# Patient Record
Sex: Female | Born: 1940 | Race: White | Hispanic: No | Smoking: Current every day smoker
Health system: Southern US, Community
[De-identification: ages and names within clinical notes are randomized; demographics above are authoritative.]

## PROBLEM LIST (undated history)

## (undated) DIAGNOSIS — R55 Syncope and collapse: Secondary | ICD-10-CM

## (undated) DIAGNOSIS — I5022 Chronic systolic (congestive) heart failure: Secondary | ICD-10-CM

## (undated) DIAGNOSIS — F32A Depression, unspecified: Secondary | ICD-10-CM

## (undated) DIAGNOSIS — K921 Melena: Secondary | ICD-10-CM

## (undated) DIAGNOSIS — E663 Overweight: Secondary | ICD-10-CM

## (undated) DIAGNOSIS — F419 Anxiety disorder, unspecified: Secondary | ICD-10-CM

## (undated) DIAGNOSIS — C50911 Malignant neoplasm of unspecified site of right female breast: Secondary | ICD-10-CM

## (undated) DIAGNOSIS — E119 Type 2 diabetes mellitus without complications: Secondary | ICD-10-CM

## (undated) DIAGNOSIS — I4819 Other persistent atrial fibrillation: Secondary | ICD-10-CM

## (undated) DIAGNOSIS — J189 Pneumonia, unspecified organism: Secondary | ICD-10-CM

## (undated) DIAGNOSIS — I10 Essential (primary) hypertension: Secondary | ICD-10-CM

## (undated) DIAGNOSIS — I519 Heart disease, unspecified: Secondary | ICD-10-CM

## (undated) DIAGNOSIS — F329 Major depressive disorder, single episode, unspecified: Secondary | ICD-10-CM

## (undated) DIAGNOSIS — E7800 Pure hypercholesterolemia, unspecified: Secondary | ICD-10-CM

## (undated) HISTORY — PX: TONSILLECTOMY: SUR1361

## (undated) HISTORY — DX: Other persistent atrial fibrillation: I48.19

## (undated) HISTORY — DX: Overweight: E66.3

## (undated) HISTORY — DX: Melena: K92.1

---

## 2008-10-31 DIAGNOSIS — C50911 Malignant neoplasm of unspecified site of right female breast: Secondary | ICD-10-CM

## 2008-10-31 HISTORY — DX: Malignant neoplasm of unspecified site of right female breast: C50.911

## 2008-10-31 HISTORY — PX: BREAST BIOPSY: SHX20

## 2009-10-31 HISTORY — PX: MASTECTOMY: SHX3

## 2011-11-29 DIAGNOSIS — C50919 Malignant neoplasm of unspecified site of unspecified female breast: Secondary | ICD-10-CM | POA: Diagnosis not present

## 2011-12-07 DIAGNOSIS — C50919 Malignant neoplasm of unspecified site of unspecified female breast: Secondary | ICD-10-CM | POA: Diagnosis not present

## 2012-02-03 ENCOUNTER — Telehealth: Payer: Self-pay | Admitting: Family Medicine

## 2012-02-03 NOTE — Telephone Encounter (Signed)
Will write order but it seems as if pt will need to establish care w/ local oncologist.  Odd that UC would not be able to flush port.  Pt needs this done ASAP.

## 2012-02-03 NOTE — Telephone Encounter (Signed)
Contacted MD Alwyn Ren to request information on placing such an order, was advised per MD Alwyn Ren to have pt go to UC/ER at [facility] to have the port assessed before sending an order to be flushed per unable to receive copy of order from MD in [place] per called pt and unable to contact them, left vm advising for pt to go to UC/ER to have port assessed and flushed ASAP, gave our office phone number with my personal  extension and advised to contact me with any questions, note pt needs to bring in orders to have on file for future need as well as an apt  To establish care with local oncologist

## 2012-02-03 NOTE — Telephone Encounter (Signed)
Patient is will be a new patient & is scheduled 5.16.13 for a New Patient Eval. Patient has just moved here from [place] & has a [employer]e. It has been 8-weeks since it was last flushed. She did get an order from her Oncologist in [place] to have it flushed here but the hospital will not recognize the order since Dr., is not a [facility]. Would you be willing to write the patient an order to have the [employer]e flushed if she brings or faxes in her oncologist orders? Please review & advise Patient ph# [phone]

## 2012-02-03 NOTE — Telephone Encounter (Signed)
Please advise 

## 2012-02-06 ENCOUNTER — Emergency Department (HOSPITAL_COMMUNITY)
Admission: EM | Admit: 2012-02-06 | Discharge: 2012-02-06 | Disposition: A | Discharge status: Home / Self Care | Payer: Medicare Other | Attending: Emergency Medicine | Admitting: Emergency Medicine

## 2012-02-06 ENCOUNTER — Encounter (HOSPITAL_COMMUNITY): Payer: Self-pay | Admitting: Emergency Medicine

## 2012-02-06 DIAGNOSIS — F329 Major depressive disorder, single episode, unspecified: Secondary | ICD-10-CM | POA: Insufficient documentation

## 2012-02-06 DIAGNOSIS — Z95828 Presence of other vascular implants and grafts: Secondary | ICD-10-CM

## 2012-02-06 DIAGNOSIS — E7800 Pure hypercholesterolemia, unspecified: Secondary | ICD-10-CM | POA: Diagnosis not present

## 2012-02-06 DIAGNOSIS — E119 Type 2 diabetes mellitus without complications: Secondary | ICD-10-CM | POA: Diagnosis not present

## 2012-02-06 DIAGNOSIS — F17200 Nicotine dependence, unspecified, uncomplicated: Secondary | ICD-10-CM | POA: Diagnosis not present

## 2012-02-06 DIAGNOSIS — C801 Malignant (primary) neoplasm, unspecified: Secondary | ICD-10-CM | POA: Diagnosis not present

## 2012-02-06 DIAGNOSIS — I10 Essential (primary) hypertension: Secondary | ICD-10-CM | POA: Diagnosis not present

## 2012-02-06 DIAGNOSIS — T82598A Other mechanical complication of other cardiac and vascular devices and implants, initial encounter: Secondary | ICD-10-CM | POA: Diagnosis not present

## 2012-02-06 DIAGNOSIS — F3289 Other specified depressive episodes: Secondary | ICD-10-CM | POA: Insufficient documentation

## 2012-02-06 DIAGNOSIS — Z452 Encounter for adjustment and management of vascular access device: Secondary | ICD-10-CM | POA: Diagnosis not present

## 2012-02-06 DIAGNOSIS — M129 Arthropathy, unspecified: Secondary | ICD-10-CM | POA: Insufficient documentation

## 2012-02-06 DIAGNOSIS — Z9089 Acquired absence of other organs: Secondary | ICD-10-CM | POA: Insufficient documentation

## 2012-02-06 HISTORY — DX: Syncope and collapse: R55

## 2012-02-06 HISTORY — DX: Pure hypercholesterolemia, unspecified: E78.00

## 2012-02-06 HISTORY — DX: Major depressive disorder, single episode, unspecified: F32.9

## 2012-02-06 HISTORY — DX: Essential (primary) hypertension: I10

## 2012-02-06 HISTORY — DX: Depression, unspecified: F32.A

## 2012-02-06 MED ORDER — HEPARIN SOD (PORK) LOCK FLUSH 100 UNIT/ML IV SOLN
500.0000 [IU] | INTRAVENOUS | Status: AC | PRN
  Administered 2012-02-06: 500 [IU]
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
## 2012-02-06 NOTE — ED Notes (Signed)
Pt is waiting for IV team to flush port

## 2012-02-06 NOTE — ED Notes (Signed)
Pt presenting to ed with c/o just moving here from [place] and needing her porta cath flushed pt states she does not have a doctor here and she needs to get it flushed soon because it's been 8 weeks pt is alert and oriented at this time. Pt is in nad

## 2012-02-06 NOTE — Discharge Instructions (Signed)
Follow up with your doctor as scheduled. If any issues with your power port, return to ER.

## 2012-02-06 NOTE — ED Provider Notes (Signed)
History     CSN: [id number]  Arrival date & time 02/06/12  1226   First MD Initiated Contact with Patient 02/06/12 1328      Chief Complaint  Patient presents with  . Vascular Access Problem    (Consider location/radiation/quality/duration/timing/severity/associated sxs/prior treatment) The history is provided by the patient.  Pt is a 71yo female, presents with a CC of getting her power port flushed. She just moved from [place] where she was receiving chemotherapy through her port. She states she was done with all treatment. States had her port flushed every 8 wks there, and now it has been 9 wks since it has been done. Her appt here is not until may. She has no other complaints, no fever, chills, malaise, no pain.   Past Medical History  Diagnosis Date  . Cancer   . Diabetes mellitus   . Hypertension   . Depression   . Syncopal episodes   . Arthritis   . High cholesterol     Past Surgical History  Procedure Date  . Tonsillectomy     No family history on file.  History  Substance Use Topics  . Smoking status: Current Everyday Smoker  . Smokeless tobacco: Not on file  . Alcohol Use: No    OB History    Grav Para Term Preterm Abortions TAB SAB Ect Mult Living                  Review of Systems  All other systems reviewed and are negative.    Allergies  Review of patient's allergies indicates no known allergies.  Home Medications   Current Outpatient Rx  Name Route Sig Dispense Refill  . GLIMEPIRIDE 2 MG PO TABS Oral Take 2 mg by mouth at bedtime.    Marland Kitchen LETROZOLE 2.5 MG PO TABS Oral Take 2.5 mg by mouth daily.    Marland Kitchen LISINOPRIL-HYDROCHLOROTHIAZIDE 20-12.5 MG PO TABS Oral Take 1 tablet by mouth daily.    Marland Kitchen METFORMIN HCL 500 MG PO TABS Oral Take 500 mg by mouth 2 (two) times daily with a meal.    . PRAVASTATIN SODIUM 40 MG PO TABS Oral Take 40 mg by mouth daily.      BP 158/76  Pulse 85  Temp(Src) 98 F (36.7 C) (Oral)  Resp 18  SpO2 99%  Physical  Exam  Nursing note and vitals reviewed. Constitutional: She is oriented to person, place, and time. She appears well-developed and well-nourished. No distress.  HENT:  Head: Normocephalic.  Eyes: Conjunctivae are normal.  Neck: Neck supple.  Cardiovascular: Normal rate, regular rhythm and normal heart sounds.   Pulmonary/Chest: Effort normal and breath sounds normal. No respiratory distress. She has no wheezes.       Power port in left chest, non tender, no signs of infection  Abdominal: Soft. Bowel sounds are normal. There is no tenderness.  Musculoskeletal: Normal range of motion. She exhibits no edema.  Neurological: She is alert and oriented to person, place, and time.  Skin: Skin is warm and dry.  Psychiatric: She has a normal mood and affect.    ED Course  Procedures (including critical care time)  Pt has no complaints, only needs her power port flushed. Will call IV team for assistance.  3:09 PM Pt's port was flushed by IV nurse. Will d/c  1. Port-a-cath in place       MDM          Lottie Mussel, [place] 02/06/12 1510

## 2012-02-06 NOTE — Telephone Encounter (Signed)
Pt noted in chart to have visited [facility] ER and had her port cath assessed/flushed

## 2012-02-07 NOTE — ED Provider Notes (Signed)
Medical screening examination/treatment/procedure(s) were performed by non-physician practitioner and as supervising physician I was immediately available for consultation/collaboration.  Toy Baker, MD 02/07/12 [phone]

## 2012-03-15 ENCOUNTER — Encounter: Payer: Self-pay | Admitting: Family Medicine

## 2012-03-15 ENCOUNTER — Ambulatory Visit (INDEPENDENT_AMBULATORY_CARE_PROVIDER_SITE_OTHER): Payer: Medicare Other | Admitting: Family Medicine

## 2012-03-15 VITALS — BP 130/75 | HR 100 | Temp 98.0°F | Ht 63.75 in | Wt 206.4 lb

## 2012-03-15 DIAGNOSIS — E119 Type 2 diabetes mellitus without complications: Secondary | ICD-10-CM | POA: Insufficient documentation

## 2012-03-15 DIAGNOSIS — F4323 Adjustment disorder with mixed anxiety and depressed mood: Secondary | ICD-10-CM | POA: Insufficient documentation

## 2012-03-15 DIAGNOSIS — E785 Hyperlipidemia, unspecified: Secondary | ICD-10-CM | POA: Insufficient documentation

## 2012-03-15 DIAGNOSIS — I10 Essential (primary) hypertension: Secondary | ICD-10-CM | POA: Diagnosis not present

## 2012-03-15 DIAGNOSIS — C50919 Malignant neoplasm of unspecified site of unspecified female breast: Secondary | ICD-10-CM

## 2012-03-15 MED ORDER — CLONAZEPAM 0.5 MG PO TABS: 0.5000 mg | ORAL_TABLET | Freq: Every evening | ORAL | Status: DC | PRN

## 2012-03-15 MED ORDER — LISINOPRIL-HYDROCHLOROTHIAZIDE 20-12.5 MG PO TABS: 1.0000 | ORAL_TABLET | Freq: Every day | ORAL | Status: DC

## 2012-03-15 MED ORDER — METFORMIN HCL 500 MG PO TABS: 500.0000 mg | ORAL_TABLET | Freq: Two times a day (BID) | ORAL | Status: DC

## 2012-03-15 MED ORDER — GLIMEPIRIDE 2 MG PO TABS: 2.0000 mg | ORAL_TABLET | Freq: Every day | ORAL | Status: DC

## 2012-03-15 MED ORDER — PRAVASTATIN SODIUM 40 MG PO TABS: 40.0000 mg | ORAL_TABLET | Freq: Every day | ORAL | Status: DC

## 2012-03-15 NOTE — Patient Instructions (Signed)
We'll call you with your oncology appt Once we get your records we'll determine what needs to be done Call with any questions or concerns Hang in there!!!

## 2012-03-15 NOTE — Progress Notes (Signed)
  Subjective:    Patient ID: Darlene Harrell, female    DOB: 10-18-1941, 71 y.o.   MRN: [id number]  HPI New to establish.  Recently moved from [place].  HTN- chronic problem, on Lisinopril HCTZ.  Denies CP, SOB, HAs, visual changes, edema.  Hyperlipidemia- chronic problem, on Pravastatin.  No abd pain, N/V, myalgias.  Unsure when last blood work done.  Breast Cancer- dx'd in 2010, now on Femara.  Has power port in place, no longer receiving med infusions.  Reports she gets blood draws from the port.  DM- chronic problem, on Amaryl daily.  Does not check CBGs regularly.  When she does check she reports it's running 'high'.  Not having symptomatic lows.  Overdue on eye exam.  Anxiety/Depression- son passed away in 01/25/23, is taking klonopin prn for sleep.  Having difficulty w/ son's death and recent move from [place].   Review of Systems For ROS see HPI     Objective:   Physical Exam  Constitutional: She is oriented to person, place, and time. She appears well-developed and well-nourished. No distress.  HENT:  Head: Normocephalic and atraumatic.  Eyes: Conjunctivae and EOM are normal. Pupils are equal, round, and reactive to light.  Neck: Normal range of motion. Neck supple. No thyromegaly present.  Cardiovascular: Normal rate, regular rhythm, normal heart sounds and intact distal pulses.   No murmur heard. Pulmonary/Chest: Effort normal and breath sounds normal. No respiratory distress.  Abdominal: Soft. She exhibits no distension. There is no tenderness.  Musculoskeletal: She exhibits no edema.  Lymphadenopathy:    She has no cervical adenopathy.  Neurological: She is alert and oriented to person, place, and time.  Skin: Skin is warm and dry.  Psychiatric: She has a normal mood and affect. Her behavior is normal.          Assessment & Plan:

## 2012-03-18 NOTE — Assessment & Plan Note (Signed)
New to provider, chronic for pt.  Tolerating statin w/out difficulty.  Due for lab work- will get this drawn at next port flush.

## 2012-03-18 NOTE — Assessment & Plan Note (Signed)
New to provider, chronic for pt.  Fair control.  Asymptomatic.  No changes at this time.

## 2012-03-18 NOTE — Assessment & Plan Note (Signed)
New to provider, chronic for pt.  Reports that she is not checking CBGs regularly and when she does, they have been high since son's death.  Overdue on eye exam.  Son plans to schedule for pt.  Will need updated lab results to adjust meds.  Stressed importance of healthy diet and regular exercise.  Will follow closely.

## 2012-03-18 NOTE — Assessment & Plan Note (Signed)
New to provider.  Pt still has port in place.  Needs local oncologist.  States she is only able to have blood drawn from the port- will refer to onc and ask for lab work to be drawn at next flush.

## 2012-03-18 NOTE — Assessment & Plan Note (Signed)
New.  Pt's son recently passed away and she moved from her family and friends in [place] to live in [place] w/ her son.  Pt and son agree that she is doing 'as well as can be expected' and don't feel that she needs daily meds at this time.  Will follow this closely.

## 2012-03-19 ENCOUNTER — Telehealth: Payer: Self-pay | Admitting: Hematology & Oncology

## 2012-03-19 ENCOUNTER — Telehealth: Payer: Self-pay | Admitting: Family Medicine

## 2012-03-19 NOTE — Telephone Encounter (Signed)
Per Dr. Gustavo Lah office / [facility], they are unable to make an appointment for patient, or do anything until they receive patient's prior records from up [facility].  Was a release form signed by patient and faxed to get those records?  When you get them, will you please be sure I can get those faxed prior to them being sent to be scanned?

## 2012-03-19 NOTE — Telephone Encounter (Signed)
.  faxed forms on 03-15-12

## 2012-03-19 NOTE — Telephone Encounter (Signed)
Received referral for patient. I talked to patient she is going to have Med Onc MD from [place] to fax her records to [place].

## 2012-03-20 NOTE — Telephone Encounter (Signed)
Called pt to clarify the correct name for previous MD, pt noted the same name and number listed on release form that has been faxed, pt noted this is the oncologist MD that she went to, however have not received the notes/records at this time, pt states that she does not remember her PCP and will call back with that information once she locates it, advised that we are awaiting these records in order to process her referral and as soon as they come in someone will call her with the apt time and date, pt understood, re-faxed forms to fax number provided

## 2012-03-21 ENCOUNTER — Telehealth: Payer: Self-pay | Admitting: Hematology & Oncology

## 2012-03-21 NOTE — Telephone Encounter (Signed)
I contacted Dr. Noralyn Pick at [phone] in [facility]. [name] is going to fax me the records.

## 2012-03-23 ENCOUNTER — Telehealth: Payer: Self-pay | Admitting: Hematology & Oncology

## 2012-03-23 NOTE — Telephone Encounter (Signed)
Patient aware of 6-11 new appointment

## 2012-03-28 ENCOUNTER — Other Ambulatory Visit: Payer: Self-pay

## 2012-03-28 ENCOUNTER — Emergency Department (HOSPITAL_COMMUNITY): Payer: Medicare Other

## 2012-03-28 ENCOUNTER — Inpatient Hospital Stay (HOSPITAL_COMMUNITY)
Admission: EM | Admit: 2012-03-28 | Discharge: 2012-04-03 | DRG: 194 | Disposition: A | Discharge status: Home / Self Care | Payer: Medicare Other | Attending: Internal Medicine | Admitting: Internal Medicine

## 2012-03-28 ENCOUNTER — Encounter (HOSPITAL_COMMUNITY): Payer: Self-pay

## 2012-03-28 DIAGNOSIS — I10 Essential (primary) hypertension: Secondary | ICD-10-CM

## 2012-03-28 DIAGNOSIS — R509 Fever, unspecified: Secondary | ICD-10-CM | POA: Diagnosis not present

## 2012-03-28 DIAGNOSIS — J159 Unspecified bacterial pneumonia: Secondary | ICD-10-CM | POA: Diagnosis not present

## 2012-03-28 DIAGNOSIS — R8271 Bacteriuria: Secondary | ICD-10-CM | POA: Diagnosis present

## 2012-03-28 DIAGNOSIS — E0590 Thyrotoxicosis, unspecified without thyrotoxic crisis or storm: Secondary | ICD-10-CM | POA: Diagnosis not present

## 2012-03-28 DIAGNOSIS — E876 Hypokalemia: Secondary | ICD-10-CM | POA: Diagnosis not present

## 2012-03-28 DIAGNOSIS — R079 Chest pain, unspecified: Secondary | ICD-10-CM | POA: Diagnosis not present

## 2012-03-28 DIAGNOSIS — R918 Other nonspecific abnormal finding of lung field: Secondary | ICD-10-CM | POA: Diagnosis not present

## 2012-03-28 DIAGNOSIS — E119 Type 2 diabetes mellitus without complications: Secondary | ICD-10-CM | POA: Diagnosis present

## 2012-03-28 DIAGNOSIS — R5383 Other fatigue: Secondary | ICD-10-CM | POA: Diagnosis not present

## 2012-03-28 DIAGNOSIS — R5381 Other malaise: Secondary | ICD-10-CM | POA: Diagnosis not present

## 2012-03-28 DIAGNOSIS — E860 Dehydration: Secondary | ICD-10-CM

## 2012-03-28 DIAGNOSIS — I4891 Unspecified atrial fibrillation: Secondary | ICD-10-CM | POA: Diagnosis not present

## 2012-03-28 DIAGNOSIS — F17200 Nicotine dependence, unspecified, uncomplicated: Secondary | ICD-10-CM | POA: Diagnosis present

## 2012-03-28 DIAGNOSIS — E871 Hypo-osmolality and hyponatremia: Secondary | ICD-10-CM | POA: Diagnosis not present

## 2012-03-28 DIAGNOSIS — J189 Pneumonia, unspecified organism: Secondary | ICD-10-CM

## 2012-03-28 DIAGNOSIS — R6889 Other general symptoms and signs: Secondary | ICD-10-CM | POA: Diagnosis not present

## 2012-03-28 DIAGNOSIS — R059 Cough, unspecified: Secondary | ICD-10-CM | POA: Diagnosis not present

## 2012-03-28 DIAGNOSIS — E0781 Sick-euthyroid syndrome: Secondary | ICD-10-CM | POA: Diagnosis present

## 2012-03-28 DIAGNOSIS — C50919 Malignant neoplasm of unspecified site of unspecified female breast: Secondary | ICD-10-CM | POA: Diagnosis present

## 2012-03-28 DIAGNOSIS — R748 Abnormal levels of other serum enzymes: Secondary | ICD-10-CM | POA: Diagnosis present

## 2012-03-28 DIAGNOSIS — I059 Rheumatic mitral valve disease, unspecified: Secondary | ICD-10-CM | POA: Diagnosis not present

## 2012-03-28 DIAGNOSIS — E669 Obesity, unspecified: Secondary | ICD-10-CM | POA: Diagnosis present

## 2012-03-28 DIAGNOSIS — R0602 Shortness of breath: Secondary | ICD-10-CM | POA: Diagnosis not present

## 2012-03-28 DIAGNOSIS — D696 Thrombocytopenia, unspecified: Secondary | ICD-10-CM

## 2012-03-28 DIAGNOSIS — I6789 Other cerebrovascular disease: Secondary | ICD-10-CM | POA: Diagnosis not present

## 2012-03-28 DIAGNOSIS — R05 Cough: Secondary | ICD-10-CM | POA: Diagnosis not present

## 2012-03-28 DIAGNOSIS — R112 Nausea with vomiting, unspecified: Secondary | ICD-10-CM | POA: Diagnosis not present

## 2012-03-28 DIAGNOSIS — R946 Abnormal results of thyroid function studies: Secondary | ICD-10-CM | POA: Diagnosis not present

## 2012-03-28 DIAGNOSIS — N390 Urinary tract infection, site not specified: Secondary | ICD-10-CM | POA: Diagnosis not present

## 2012-03-28 LAB — CBC
MCH: 28.5 pg (ref 26.0–34.0)
MCHC: 33.7 g/dL (ref 30.0–36.0)
MCV: 84.4 fL (ref 78.0–100.0)
Platelets: 210 10*3/uL (ref 150–400)
Platelets: 182 10*3/uL (ref 150–400)
RBC: 4.11 MIL/uL (ref 3.87–5.11)
RBC: 3.53 MIL/uL — ABNORMAL LOW (ref 3.87–5.11)
RDW: 14 % (ref 11.5–15.5)
RDW: 13.9 % (ref 11.5–15.5)
WBC: 10.1 10*3/uL (ref 4.0–10.5)

## 2012-03-28 LAB — COMPREHENSIVE METABOLIC PANEL
ALT: 14 U/L (ref 0–35)
AST: 12 U/L (ref 0–37)
Albumin: 3 g/dL — ABNORMAL LOW (ref 3.5–5.2)
Alkaline Phosphatase: 78 U/L (ref 39–117)
Calcium: 9.2 mg/dL (ref 8.4–10.5)
GFR calc Af Amer: >90 mL/min (ref >90)
Potassium: 3.2 mEq/L — ABNORMAL LOW (ref 3.5–5.1)
Sodium: 129 mEq/L — ABNORMAL LOW (ref 135–145)
Total Protein: 7 g/dL (ref 6.0–8.3)

## 2012-03-28 LAB — DIFFERENTIAL
Basophils Absolute: 0 10*3/uL (ref 0.0–0.1)
Basophils Absolute: 0 10*3/uL (ref 0.0–0.1)
Basophils Relative: 0 % (ref 0–1)
Eosinophils Absolute: 0 10*3/uL (ref 0.0–0.7)
Eosinophils Relative: 0 % (ref 0–5)
Lymphocytes Relative: 5 % — ABNORMAL LOW (ref 12–46)
Lymphs Abs: 0.4 10*3/uL — ABNORMAL LOW (ref 0.7–4.0)
Neutro Abs: 8.7 10*3/uL — ABNORMAL HIGH (ref 1.7–7.7)
Neutrophils Relative %: 88 % — ABNORMAL HIGH (ref 43–77)
Neutrophils Relative %: 87 % — ABNORMAL HIGH (ref 43–77)

## 2012-03-28 LAB — LACTIC ACID, PLASMA: Lactic Acid, Venous: 1.2 mmol/L (ref 0.5–2.2)

## 2012-03-28 LAB — URINALYSIS, ROUTINE W REFLEX MICROSCOPIC
Glucose, UA: 250 mg/dL — AB
Ketones, ur: 40 mg/dL — AB
Nitrite: NEGATIVE
Specific Gravity, Urine: 1.028 (ref 1.005–1.030)
pH: 6 (ref 5.0–8.0)

## 2012-03-28 LAB — URINE MICROSCOPIC-ADD ON

## 2012-03-28 LAB — GLUCOSE, CAPILLARY

## 2012-03-28 MED ORDER — SODIUM CHLORIDE 0.9 % IV SOLN
INTRAVENOUS | Status: DC
  Administered 2012-03-28: 19:00:00 via INTRAVENOUS

## 2012-03-28 MED ORDER — GLIMEPIRIDE 2 MG PO TABS
2.0000 mg | ORAL_TABLET | Freq: Every day | ORAL | Status: DC
  Administered 2012-03-28 – 2012-04-02 (×6): 2 mg via ORAL
  Filled 2012-03-28 (×7): qty 1

## 2012-03-28 MED ORDER — SODIUM CHLORIDE 0.9 % IV SOLN
INTRAVENOUS | Status: DC
  Administered 2012-03-28: 13:00:00 via INTRAVENOUS

## 2012-03-28 MED ORDER — LETROZOLE 2.5 MG PO TABS
2.5000 mg | ORAL_TABLET | Freq: Every day | ORAL | Status: DC
  Administered 2012-03-28 – 2012-04-03 (×7): 2.5 mg via ORAL
  Filled 2012-03-28 (×7): qty 1

## 2012-03-28 MED ORDER — LISINOPRIL 20 MG PO TABS
20.0000 mg | ORAL_TABLET | Freq: Every day | ORAL | Status: DC
  Administered 2012-03-28 – 2012-04-03 (×7): 20 mg via ORAL
  Filled 2012-03-28 (×7): qty 1

## 2012-03-28 MED ORDER — CLONAZEPAM 0.5 MG PO TABS
0.5000 mg | ORAL_TABLET | Freq: Every evening | ORAL | Status: DC | PRN
  Administered 2012-03-30 – 2012-04-02 (×5): 0.5 mg via ORAL
  Filled 2012-03-28 (×6): qty 1

## 2012-03-28 MED ORDER — SIMVASTATIN 20 MG PO TABS
20.0000 mg | ORAL_TABLET | Freq: Every day | ORAL | Status: DC
  Administered 2012-03-28 – 2012-03-29 (×2): 20 mg via ORAL
  Filled 2012-03-28 (×3): qty 1

## 2012-03-28 MED ORDER — ASPIRIN EC 81 MG PO TBEC
81.0000 mg | DELAYED_RELEASE_TABLET | Freq: Once | ORAL | Status: AC
  Administered 2012-03-28: 81 mg via ORAL
  Filled 2012-03-28: qty 1

## 2012-03-28 MED ORDER — LEVOFLOXACIN IN D5W 500 MG/100ML IV SOLN
500.0000 mg | INTRAVENOUS | Status: DC
  Administered 2012-03-28 – 2012-03-31 (×4): 500 mg via INTRAVENOUS
  Filled 2012-03-28 (×4): qty 100

## 2012-03-28 MED ORDER — ASPIRIN 81 MG PO CHEW
CHEWABLE_TABLET | ORAL | Status: AC
  Filled 2012-03-28: qty 1

## 2012-03-28 MED ORDER — SODIUM CHLORIDE 0.9 % IV BOLUS (SEPSIS)
500.0000 mL | Freq: Once | INTRAVENOUS | Status: AC
  Administered 2012-03-28: 500 mL via INTRAVENOUS

## 2012-03-28 MED ORDER — ACETAMINOPHEN 500 MG PO TABS
ORAL_TABLET | ORAL | Status: AC
  Administered 2012-03-28: 1000 mg via ORAL
  Filled 2012-03-28: qty 2

## 2012-03-28 MED ORDER — INSULIN ASPART 100 UNIT/ML ~~LOC~~ SOLN
0.0000 [IU] | Freq: Three times a day (TID) | SUBCUTANEOUS | Status: DC
  Administered 2012-03-29 (×3): 3 [IU] via SUBCUTANEOUS
  Administered 2012-03-30: 5 [IU] via SUBCUTANEOUS
  Administered 2012-03-30: 8 [IU] via SUBCUTANEOUS
  Administered 2012-03-30: 5 [IU] via SUBCUTANEOUS
  Administered 2012-03-31: 2 [IU] via SUBCUTANEOUS
  Administered 2012-03-31: 11 [IU] via SUBCUTANEOUS
  Administered 2012-03-31 – 2012-04-01 (×3): 3 [IU] via SUBCUTANEOUS
  Administered 2012-04-01: 5 [IU] via SUBCUTANEOUS
  Administered 2012-04-02: 2 [IU] via SUBCUTANEOUS
  Administered 2012-04-02: 5 [IU] via SUBCUTANEOUS
  Administered 2012-04-03: 2 [IU] via SUBCUTANEOUS

## 2012-03-28 MED ORDER — ACETAMINOPHEN 325 MG PO TABS: 650.0000 mg | ORAL_TABLET | Freq: Once | ORAL | Status: DC

## 2012-03-28 MED ORDER — ACETAMINOPHEN 325 MG PO TABS
650.0000 mg | ORAL_TABLET | Freq: Four times a day (QID) | ORAL | Status: DC | PRN
  Administered 2012-03-28 – 2012-03-29 (×2): 650 mg via ORAL
  Filled 2012-03-28 (×3): qty 2

## 2012-03-28 MED ORDER — INSULIN ASPART 100 UNIT/ML ~~LOC~~ SOLN
0.0000 [IU] | Freq: Every day | SUBCUTANEOUS | Status: DC
  Administered 2012-03-28: 2 [IU] via SUBCUTANEOUS

## 2012-03-28 MED ORDER — ENOXAPARIN SODIUM 40 MG/0.4ML ~~LOC~~ SOLN
40.0000 mg | SUBCUTANEOUS | Status: DC
  Administered 2012-03-28 – 2012-04-02 (×6): 40 mg via SUBCUTANEOUS
  Filled 2012-03-28 (×7): qty 0.4

## 2012-03-28 NOTE — ED Notes (Signed)
Pt and family was concerned about mother getting food and something for a headache. Paged Md Jerolyn Center about headache and a fever 102.3.

## 2012-03-28 NOTE — ED Notes (Signed)
Patient being transported to radiology.will obtain labs upon patient's return 

## 2012-03-28 NOTE — ED Notes (Signed)
States that she wanted the ASA for a headache but her head no longer hurts. States that she still wanted to take the ASA due to aching in back from lying on bed.

## 2012-03-28 NOTE — ED Notes (Signed)
ZOX:[id number]<AV> Expected date:<BR> Expected time:<BR> Means of arrival:<BR> Comments:<BR> EMS/weak/confused/hypertensive

## 2012-03-28 NOTE — ED Notes (Signed)
Per EMS family states pt acting different "hasn't smoked today usual a pack a day"; states feels weak; denies pain, states "don't feel right"

## 2012-03-28 NOTE — ED Provider Notes (Signed)
History     CSN: [id number]  Arrival date & time 03/28/12  4098   First MD Initiated Contact with Patient 03/28/12 0815      Chief Complaint  Patient presents with  . Weakness  . Hypertension    (Consider location/radiation/quality/duration/timing/severity/associated sxs/prior treatment) HPI Pt with 3 days of fatigue, generalized weakness, myalgia, subjective fever and non-productive cough. Decreased PO intake. Med port in place. History of breast CA s/p mastectomy. Denies CP, neck stiffness, sore throat, abd pain, N/V/D, rash.  Past Medical History  Diagnosis Date  . Cancer   . Diabetes mellitus   . Hypertension   . Depression   . Syncopal episodes   . Arthritis   . High cholesterol   . Blood in stool     occasional  . Chicken pox   . Fainting     Past Surgical History  Procedure Date  . Tonsillectomy   . Breast surgery 2010 and 2011     biopsy and noted mastectomy    Family History  Problem Relation Age of Onset  . Stroke Mother   . Hypertension Mother   . Mental retardation Mother   . Diabetes Mother   . Alcohol abuse Father   . Hypertension Father   . Diabetes Father   . Diabetes Son   . Kidney failure Son   . Early death Son   . Hyperlipidemia Son     History  Substance Use Topics  . Smoking status: Current Everyday Smoker -- 1.0 packs/day for 48 years  . Smokeless tobacco: Not on file  . Alcohol Use: Yes     4 drinks a year    OB History    Grav Para Term Preterm Abortions TAB SAB Ect Mult Living                  Review of Systems  Constitutional: Positive for fever, appetite change and fatigue.  HENT: Negative for sore throat, rhinorrhea, neck pain, neck stiffness and sinus pressure.   Respiratory: Positive for cough. Negative for chest tightness, shortness of breath and wheezing.   Cardiovascular: Negative for chest pain, palpitations and leg swelling.  Gastrointestinal: Negative for nausea, vomiting, abdominal pain and diarrhea.    Genitourinary: Negative for dysuria, frequency and flank pain.  Musculoskeletal: Positive for myalgias. Negative for back pain.  Skin: Negative for pallor and rash.  Neurological: Positive for headaches. Negative for syncope and numbness.    Allergies  Review of patient's allergies indicates no known allergies.  Home Medications   Current Outpatient Rx  Name Route Sig Dispense Refill  . CLONAZEPAM 0.5 MG PO TABS Oral Take 1 tablet (0.5 mg total) by mouth at bedtime as needed. 30 tablet 1  . GLIMEPIRIDE 2 MG PO TABS Oral Take 1 tablet (2 mg total) by mouth at bedtime. 30 tablet 1  . LETROZOLE 2.5 MG PO TABS Oral Take 2.5 mg by mouth daily.    Marland Kitchen LISINOPRIL-HYDROCHLOROTHIAZIDE 20-12.5 MG PO TABS Oral Take 1 tablet by mouth daily. 30 tablet 1  . METFORMIN HCL 500 MG PO TABS Oral Take 1 tablet (500 mg total) by mouth 2 (two) times daily with a meal. 60 tablet 1  . PRAVASTATIN SODIUM 40 MG PO TABS Oral Take 1 tablet (40 mg total) by mouth daily. 30 tablet 1    BP 133/57  Pulse 95  Temp(Src) 100.4 F (38 C) (Oral)  Resp 19  SpO2 93%  Physical Exam  Nursing note and vitals reviewed. Constitutional:  She is oriented to person, place, and time. She appears well-developed and well-nourished. No distress.       Flushed appearing  HENT:  Head: Normocephalic and atraumatic.  Mouth/Throat: Oropharynx is clear and moist. No oropharyngeal exudate.  Eyes: EOM are normal. Pupils are equal, round, and reactive to light.  Neck: Normal range of motion. Neck supple.       No meningismus   Cardiovascular: Normal rate and regular rhythm.   Pulmonary/Chest: Effort normal. No respiratory distress. She has no wheezes. She has rales.       Scattered rales  Abdominal: Soft. Bowel sounds are normal. There is no tenderness. There is no rebound and no guarding.  Musculoskeletal: Normal range of motion. She exhibits no edema and no tenderness.       No calf swelling or tenderness  Neurological: She is  alert and oriented to person, place, and time.       5/5 motor, sensation intact  Skin: Skin is warm and dry. No rash noted. No erythema.  Psychiatric: She has a normal mood and affect. Her behavior is normal.    ED Course  Procedures (including critical care time)  Labs Reviewed  CBC - Abnormal; Notable for the following:    WBC 11.0 (*)    Hemoglobin 11.7 (*)    HCT 34.7 (*)    All other components within normal limits  DIFFERENTIAL - Abnormal; Notable for the following:    Neutrophils Relative 88 (*)    Neutro Abs 9.7 (*)    Lymphocytes Relative 3 (*)    Lymphs Abs 0.4 (*)    All other components within normal limits  COMPREHENSIVE METABOLIC PANEL - Abnormal; Notable for the following:    Sodium 129 (*)    Potassium 3.2 (*)    Chloride 92 (*)    Glucose, Bld 247 (*)    Albumin 3.0 (*)    All other components within normal limits  URINALYSIS, ROUTINE W REFLEX MICROSCOPIC - Abnormal; Notable for the following:    Color, Urine AMBER (*) BIOCHEMICALS MAY BE AFFECTED BY COLOR   APPearance CLOUDY (*)    Glucose, UA 250 (*)    Hgb urine dipstick SMALL (*)    Bilirubin Urine SMALL (*)    Ketones, ur 40 (*)    Protein, ur 100 (*)    Leukocytes, UA MODERATE (*)    All other components within normal limits  URINE MICROSCOPIC-ADD ON - Abnormal; Notable for the following:    Squamous Epithelial / LPF FEW (*)    Bacteria, UA FEW (*)    All other components within normal limits  LACTIC ACID, PLASMA  CULTURE, BLOOD (ROUTINE X 2)  CULTURE, BLOOD (ROUTINE X 2)   Dg Chest 2 View  03/28/2012  *RADIOLOGY REPORT*  Clinical Data: Cough, shortness of breath, weakness and fever.  CHEST - 2 VIEW  Comparison: None.  Findings: Trachea is midline.  Left IJ Port-A-Cath tip projects over the SVC.  Heart size within normal limits.  Question very mild interstitial prominence and indistinctness.  No definite pleural fluid. Surgical clips in the right axilla.  IMPRESSION: Mild interstitial prominence  may be due to emphysema.  Difficult to exclude an acute process such as mild edema or viral pneumonia.  Original Report Authenticated By: Reyes Ivan, M.D.     1. Fever   2. Community acquired pneumonia   3. UTI (lower urinary tract infection)   4. Hyponatremia       MDM  Discussed with [facility]. Will see in ED        Loren Racer, MD 03/28/12 1158

## 2012-03-28 NOTE — H&P (Signed)
Hospital Admission Note Date: 03/28/2012  Patient name: Darlene Harrell Medical record number: [id number] Date of birth: [birth year] Age: 71 y.o. Gender: female PCP: Neena Rhymes, MD, MD  Attending physician: Altha Harm, MD  Chief Complaint: Fever, myalgias chills, generalized weakness and nonproductive cough x3 days  History of Present Illness: Patient is a 71 year old female who presents to the emergency room with a three-day history of tactile fever, general myalgias over her entire body, chills, generalized weakness and nonproductive cough. The patient states that she has had decreased oral intake and had one episode of vomiting. She denies any neck stiffness, photophobia but does admit to an intermittent headache.  X-rays of the chest taken in the emergency room suggest a viral pneumonia. We are asked to the patient for further evaluation and management. Scheduled Meds:   . acetaminophen      . enoxaparin  40 mg Subcutaneous Q24H  . glimepiride  2 mg Oral QAC supper  . letrozole  2.5 mg Oral Daily  . levofloxacin (LEVAQUIN) IV  500 mg Intravenous Q24H  . lisinopril  20 mg Oral Daily  . simvastatin  20 mg Oral q1800  . sodium chloride  500 mL Intravenous Once  . sodium chloride  500 mL Intravenous Once  . DISCONTD: sodium chloride   Intravenous STAT  . DISCONTD: acetaminophen  650 mg Oral Once   Continuous Infusions:   . sodium chloride     PRN Meds:.clonazePAM Allergies: Review of patient's allergies indicates no known allergies. Past Medical History  Diagnosis Date  . Cancer   . Diabetes mellitus   . Hypertension   . Depression   . Syncopal episodes   . Arthritis   . High cholesterol   . Blood in stool     occasional  . Chicken pox   . Fainting    Past Surgical History  Procedure Date  . Tonsillectomy   . Breast surgery 2010 and 2011     biopsy and noted mastectomy   Family History  Problem Relation Age of Onset  . Stroke Mother   . Hypertension  Mother   . Mental retardation Mother   . Diabetes Mother   . Alcohol abuse Father   . Hypertension Father   . Diabetes Father   . Diabetes Son   . Kidney failure Son   . Early death Son   . Hyperlipidemia Son    History   Social History  . Marital Status: Divorced    Spouse Name: N/A    Number of Children: N/A  . Years of Education: N/A   Occupational History  . Not on file.   Social History Main Topics  . Smoking status: Current Everyday Smoker -- 1.0 packs/day for 48 years    Types: Cigarettes  . Smokeless tobacco: Never Used  . Alcohol Use: Yes     4 drinks a year  . Drug Use: No  . Sexually Active: No   Other Topics Concern  . Not on file   Social History Narrative  . No narrative on file   Review of Systems: A comprehensive review of systems was negative except for as noted in the history of present illness. Physical Exam: No intake or output data in the 24 hours ending 03/28/12 1632 General: Alert, awake, oriented x3, in no acute distress.  HEENT: [place]/AT PEERL, EOMI Neck: Trachea midline,  no masses, no thyromegal,y no JVD, no carotid bruit OROPHARYNX:  Moist, No exudate/ erythema/lesions.  Heart: Regular rate and rhythm,  without murmurs, rubs, gallops, PMI non-displaced, no heaves or thrills on palpation.  Lungs: Diffuse rales no wheezing or rhonchi noted. No increased vocal fremitus resonant to percussion  Abdomen: Soft, nontender, nondistended, positive bowel sounds, no masses no hepatosplenomegaly noted.Marland Kitchen  Psychiatric: Patient alert and oriented x3, good insight and cognition, good recent to remote recall.   Lab results:  Basename 03/28/12 0852  NA 129*  K 3.2*  CL 92*  CO2 23  GLUCOSE 247*  BUN 10  CREATININE 0.51  CALCIUM 9.2  MG --  PHOS --    Basename 03/28/12 0852  AST 12  ALT 14  ALKPHOS 78  BILITOT 0.6  PROT 7.0  ALBUMIN 3.0*   No results found for this basename: LIPASE:2,AMYLASE:2 in the last 72 hours  Basename 03/28/12 0852   WBC 11.0*  NEUTROABS 9.7*  HGB 11.7*  HCT 34.7*  MCV 84.4  PLT 210   No results found for this basename: CKTOTAL:3,CKMB:3,CKMBINDEX:3,TROPONINI:3 in the last 72 hours No components found with this basename: POCBNP:3 No results found for this basename: DDIMER:2 in the last 72 hours No results found for this basename: HGBA1C:2 in the last 72 hours No results found for this basename: CHOL:2,HDL:2,LDLCALC:2,TRIG:2,CHOLHDL:2,LDLDIRECT:2 in the last 72 hours No results found for this basename: TSH,T4TOTAL,FREET3,T3FREE,THYROIDAB in the last 72 hours No results found for this basename: VITAMINB12:2,FOLATE:2,FERRITIN:2,TIBC:2,IRON:2,RETICCTPCT:2 in the last 72 hours Imaging results:  Dg Chest 2 View  03/28/2012  *RADIOLOGY REPORT*  Clinical Data: Cough, shortness of breath, weakness and fever.  CHEST - 2 VIEW  Comparison: None.  Findings: Trachea is midline.  Left IJ Port-A-Cath tip projects over the SVC.  Heart size within normal limits.  Question very mild interstitial prominence and indistinctness.  No definite pleural fluid. Surgical clips in the right axilla.  IMPRESSION: Mild interstitial prominence may be due to emphysema.  Difficult to exclude an acute process such as mild edema or viral pneumonia.  Original Report Authenticated By: Reyes Ivan, M.D.   Other results: EKG:  sinus tachycardia.   Patient Active Hospital Problem List: Atypical pneumonia (03/28/2012)   Assessment the patient presents with an atypical community-acquired pneumonia. She's been started on Levaquin I will continue on such. Her cultures have been obtained and we will follow the cultures. The patient's cough is nonproductive but if possible we will obtain a sputum culture. The patient's dehydrated state and it is quite possible that the chest x-ray is nonrevealing secondary to her dehydrated state  Will reassess clinically and obtain repeat chest x-ray if warranted from a clinical perspective   Dehydration  (03/28/2012)   Assessment: Patient is clinically dehydrated. We'll continue IV fluids.     Hyponatremia (03/28/2012)   Assessment:  Felt to be secondary to dehydrated state. Will reassess electrolytes in the morning    Bacteriuria with pyuria (03/28/2012)   Assessment:  The patient has a bacteria however has no clinical symptoms. The patient is on Levaquin for her pneumonia which also cover urinary pathogens. We'll followup on the urine culture.    Diabetes type 2 (03/28/2012)  Assessment: Will resume Amaryl and check blood sugars a.c. and at bedtime   Jolyn Deshmukh A. 03/28/2012, 4:32 PM

## 2012-03-28 NOTE — ED Notes (Signed)
Attempted to call report x 1  

## 2012-03-29 ENCOUNTER — Inpatient Hospital Stay (HOSPITAL_COMMUNITY): Payer: Medicare Other

## 2012-03-29 DIAGNOSIS — D696 Thrombocytopenia, unspecified: Secondary | ICD-10-CM

## 2012-03-29 DIAGNOSIS — E871 Hypo-osmolality and hyponatremia: Secondary | ICD-10-CM

## 2012-03-29 DIAGNOSIS — E876 Hypokalemia: Secondary | ICD-10-CM

## 2012-03-29 LAB — BASIC METABOLIC PANEL
CO2: 22 mEq/L (ref 19–32)
CO2: 23 mEq/L (ref 19–32)
Calcium: 8.3 mg/dL — ABNORMAL LOW (ref 8.4–10.5)
Chloride: 96 mEq/L (ref 96–112)
Chloride: 93 mEq/L — ABNORMAL LOW (ref 96–112)
Creatinine, Ser: 0.41 mg/dL — ABNORMAL LOW (ref 0.50–1.10)
GFR calc non Af Amer: >90 mL/min (ref >90)
GFR calc non Af Amer: >90 mL/min (ref >90)
Glucose, Bld: 177 mg/dL — ABNORMAL HIGH (ref 70–99)
Glucose, Bld: 185 mg/dL — ABNORMAL HIGH (ref 70–99)
Glucose, Bld: 196 mg/dL — ABNORMAL HIGH (ref 70–99)
Potassium: 3 mEq/L — ABNORMAL LOW (ref 3.5–5.1)
Potassium: 2.9 mEq/L — ABNORMAL LOW (ref 3.5–5.1)
Sodium: 127 mEq/L — ABNORMAL LOW (ref 135–145)
Sodium: 131 mEq/L — ABNORMAL LOW (ref 135–145)

## 2012-03-29 LAB — GLUCOSE, CAPILLARY: Glucose-Capillary: 214 mg/dL — ABNORMAL HIGH (ref 70–99)

## 2012-03-29 MED ORDER — SODIUM CHLORIDE 0.9 % IV SOLN
INTRAVENOUS | Status: DC
  Administered 2012-03-29: 12:00:00 via INTRAVENOUS
  Filled 2012-03-29 (×3): qty 1000

## 2012-03-29 MED ORDER — POTASSIUM CHLORIDE CRYS ER 20 MEQ PO TBCR
40.0000 meq | EXTENDED_RELEASE_TABLET | Freq: Once | ORAL | Status: AC
  Administered 2012-03-29: 40 meq via ORAL
  Filled 2012-03-29: qty 2

## 2012-03-29 MED ORDER — POTASSIUM CHLORIDE CRYS ER 20 MEQ PO TBCR
40.0000 meq | EXTENDED_RELEASE_TABLET | ORAL | Status: AC
  Administered 2012-03-29 (×2): 40 meq via ORAL
  Filled 2012-03-29 (×2): qty 2

## 2012-03-29 MED ORDER — FUROSEMIDE 10 MG/ML IJ SOLN
40.0000 mg | Freq: Once | INTRAMUSCULAR | Status: AC
  Administered 2012-03-29: 40 mg via INTRAVENOUS
  Filled 2012-03-29: qty 4

## 2012-03-29 MED ORDER — MAGNESIUM SULFATE 40 MG/ML IJ SOLN
2.0000 g | Freq: Once | INTRAMUSCULAR | Status: AC
  Administered 2012-03-29: 2 g via INTRAVENOUS
  Filled 2012-03-29: qty 50

## 2012-03-29 MED ORDER — LEVALBUTEROL HCL 0.63 MG/3ML IN NEBU
0.6300 mg | INHALATION_SOLUTION | Freq: Four times a day (QID) | RESPIRATORY_TRACT | Status: DC
  Administered 2012-03-30 – 2012-03-31 (×5): 0.63 mg via RESPIRATORY_TRACT
  Filled 2012-03-29 (×15): qty 3

## 2012-03-29 NOTE — Progress Notes (Addendum)
Spoke to Darlene Harrell in lab about adding urine culture to ua done earlier in the day. Request order sheet sent to lab.

## 2012-03-29 NOTE — Progress Notes (Signed)
Subjective: Patient reports that she feels better today and her son endorses that she looks better than she did at arrival to the ER yesterday. However the patient appears to be having an increased work of breathing today greater than she did yesterday.  Interval history: The patient has had a worsening of her sodium levels despite IV hydration. Additionally her potassium this morning was low at 2.8. she received 2 doses of potassium orally per supplementation. Objective: Filed Vitals:   03/28/12 2150 03/29/12 0530 03/29/12 1254 03/29/12 1402  BP:  168/74 178/79   Pulse: 84 100 112   Temp:  98.8 F (37.1 C) 101.4 F (38.6 C) 100 F (37.8 C)  TempSrc:  Axillary Oral Oral  Resp:  18 18   Height:      Weight:      SpO2:  94% 93%    Weight change:   Intake/Output Summary (Last 24 hours) at 03/29/12 1818 Last data filed at 03/29/12 1715  Gross per 24 hour  Intake 2359.5 ml  Output   2600 ml  Net -240.5 ml    General: Alert, awake, oriented x3, in no acute distress.  HEENT: [place]/AT PEERL, EOMI Neck: Trachea midline,  no masses, no thyromegal,y no JVD, no carotid bruit OROPHARYNX:  Moist, No exudate/ erythema/lesions.  Heart: Regular rate and rhythm, without murmurs, rubs, gallops, PMI non-displaced, no heaves or thrills on palpation.  Lungs: Clear to auscultation, no wheezing or rhonchi noted. No increased vocal fremitus resonant to percussion  Abdomen: Soft, nontender, nondistended, positive bowel sounds, no masses no hepatosplenomegaly noted..  Neuro: No focal neurological deficits noted cranial nerves II through XII grossly intact. DTRs 2+ bilaterally upper and lower extremities. Strength 5 out of 5 in bilateral upper and lower extremities. Musculoskeletal: No warm swelling or erythema around joints, no spinal tenderness noted. Psychiatric: Patient alert and oriented x3, good insight and cognition, good recent to remote recall. Lymph node survey: No cervical axillary or inguinal  lymphadenopathy noted.     Lab Results:  [facility] 03/29/12 1325 03/29/12 0622  NA 127* 132*  K 3.0* 2.8*  CL 93* 96  CO2 23 22  GLUCOSE 185* 177*  BUN 8 9  CREATININE 0.40* 0.41*  CALCIUM 8.6 8.3*  MG 1.8 --  PHOS -- --    Basename 03/28/12 0852  AST 12  ALT 14  ALKPHOS 78  BILITOT 0.6  PROT 7.0  ALBUMIN 3.0*   No results found for this basename: LIPASE:2,AMYLASE:2 in the last 72 hours  Basename 03/28/12 2042 03/28/12 0852  WBC 10.1 11.0*  NEUTROABS 8.7* 9.7*  HGB 10.2* 11.7*  HCT 29.8* 34.7*  MCV 84.4 84.4  PLT 182 210   No results found for this basename: CKTOTAL:3,CKMB:3,CKMBINDEX:3,TROPONINI:3 in the last 72 hours No components found with this basename: POCBNP:3 No results found for this basename: DDIMER:2 in the last 72 hours No results found for this basename: HGBA1C:2 in the last 72 hours No results found for this basename: CHOL:2,HDL:2,LDLCALC:2,TRIG:2,CHOLHDL:2,LDLDIRECT:2 in the last 72 hours No results found for this basename: TSH,T4TOTAL,FREET3,T3FREE,THYROIDAB in the last 72 hours No results found for this basename: VITAMINB12:2,FOLATE:2,FERRITIN:2,TIBC:2,IRON:2,RETICCTPCT:2 in the last 72 hours  Micro Results: Recent Results (from the past 240 hour(s))  CULTURE, BLOOD (ROUTINE X 2)     Status: Normal (Preliminary result)   Collection Time   03/28/12  9:20 AM      Component Value Range Status Comment   Specimen Description BLOOD LEFT HAND   Final    Special Requests  BOTTLES DRAWN AEROBIC ONLY   Final    Culture  Setup Time [id number]   Final    Culture     Final    Value:        BLOOD CULTURE RECEIVED NO GROWTH TO DATE CULTURE WILL BE HELD FOR 5 DAYS BEFORE ISSUING A FINAL NEGATIVE REPORT   Report Status PENDING   Incomplete   CULTURE, BLOOD (ROUTINE X 2)     Status: Normal (Preliminary result)   Collection Time   03/28/12  9:20 AM      Component Value Range Status Comment   Specimen Description BLOOD LEFT HAND   Final    Special  Requests BOTTLES DRAWN AEROBIC AND ANAEROBIC   Final    Culture  Setup Time [id number]   Final    Culture     Final    Value:        BLOOD CULTURE RECEIVED NO GROWTH TO DATE CULTURE WILL BE HELD FOR 5 DAYS BEFORE ISSUING A FINAL NEGATIVE REPORT   Report Status PENDING   Incomplete     Studies/Results: Dg Chest 2 View  03/29/2012  *RADIOLOGY REPORT*  Clinical Data: Difficulty breathing.  Fever and chest pain  CHEST - 2 VIEW  Comparison: 03/28/2012  Findings: There is a left chest wall porta-catheter with tip in the SVC.  The heart size appears normal.  Interval development of airspace opacity within the perihilar right lower lobe appears new from previous exam.  Mild diffuse interstitial edema is present.  IMPRESSION:  1.  New right base opacity which may represent pneumonia. 2.  Interstitial edema.  Original Report Authenticated By: Rosealee Albee, M.D.   Dg Chest 2 View  03/28/2012  *RADIOLOGY REPORT*  Clinical Data: Cough, shortness of breath, weakness and fever.  CHEST - 2 VIEW  Comparison: None.  Findings: Trachea is midline.  Left IJ Port-A-Cath tip projects over the SVC.  Heart size within normal limits.  Question very mild interstitial prominence and indistinctness.  No definite pleural fluid. Surgical clips in the right axilla.  IMPRESSION: Mild interstitial prominence may be due to emphysema.  Difficult to exclude an acute process such as mild edema or viral pneumonia.  Original Report Authenticated By: Reyes Ivan, M.D.    Medications: I have reviewed the patient's current medications. Scheduled Meds:   . aspirin      . enoxaparin  40 mg Subcutaneous Q24H  . furosemide  40 mg Intravenous Once  . glimepiride  2 mg Oral QAC supper  . insulin aspart  0-15 Units Subcutaneous TID WC  . insulin aspart  0-5 Units Subcutaneous QHS  . letrozole  2.5 mg Oral Daily  . levofloxacin (LEVAQUIN) IV  500 mg Intravenous Q24H  . lisinopril  20 mg Oral Daily  . magnesium sulfate 1 - 4  g bolus IVPB  2 g Intravenous Once  . potassium chloride  40 mEq Oral Q4H  . potassium chloride  40 mEq Oral Q2H  . simvastatin  20 mg Oral q1800   Continuous Infusions:   . sodium chloride 0.9 % 1,000 mL with potassium chloride 40 mEq infusion 100 mL/hr at 03/29/12 1156  . DISCONTD: sodium chloride 100 mL/hr at 03/28/12 1857   PRN Meds:.acetaminophen, clonazePAM Assessment/Plan: Patient Active Hospital Problem List: Community-acquired pneumonia (03/28/2012)   Assessment: The patient appears to have increased work of breathing today however she has no wheezing to suggest reactive airway disease. I will continue her IV Levaquin and  obtain a chest x-ray to further evaluate the anatomy of her lungs. In light of her hyponatremia we'll check a urine Legionella antigen.  Hyponatremia (03/28/2012)   Assessment: Hyponatremia appears to be worsening. 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 Unfortunately the patient has already received IV fluids and thus urine studies  would not be very helpful at this time.    Plan: In light of the patient's euvolemic state and her hyponatremia I will volume restrict her and give her a dose of Lasix. I will repeat her beam at at 6 PM to evaluate the effectiveness of this upon her sodium.   Dehydration (03/28/2012)   Assessment: The patient now appears to be in a euvolemic state. I will saline lock her IV fluids. Patient is drinking well however still does not have much of an appetite for solids   Bacteriuria with pyuria (03/28/2012)   Assessment: Patient has no symptoms of dysuria. Urine cultures pending will continue Levaquin    Hypokalemia (03/29/2012)   Assessment: I will replete her potassium orally and check a magnesium level.   LOS: 1 day

## 2012-03-29 NOTE — Progress Notes (Signed)
UR complete 

## 2012-03-30 DIAGNOSIS — I059 Rheumatic mitral valve disease, unspecified: Secondary | ICD-10-CM

## 2012-03-30 DIAGNOSIS — J189 Pneumonia, unspecified organism: Secondary | ICD-10-CM

## 2012-03-30 DIAGNOSIS — I4891 Unspecified atrial fibrillation: Secondary | ICD-10-CM

## 2012-03-30 DIAGNOSIS — R509 Fever, unspecified: Secondary | ICD-10-CM

## 2012-03-30 LAB — CBC
Hemoglobin: 11.2 g/dL — ABNORMAL LOW (ref 12.0–15.0)
MCHC: 34.3 g/dL (ref 30.0–36.0)
RBC: 3.88 MIL/uL (ref 3.87–5.11)
WBC: 11 10*3/uL — ABNORMAL HIGH (ref 4.0–10.5)

## 2012-03-30 LAB — URINALYSIS, ROUTINE W REFLEX MICROSCOPIC
Glucose, UA: 100 mg/dL — AB
Leukocytes, UA: NEGATIVE
Protein, ur: 30 mg/dL — AB
Urobilinogen, UA: 1 mg/dL (ref 0.0–1.0)

## 2012-03-30 LAB — DIFFERENTIAL
Basophils Relative: 0 % (ref 0–1)
Monocytes Relative: 12 % (ref 3–12)
Neutro Abs: 8.9 10*3/uL — ABNORMAL HIGH (ref 1.7–7.7)
Neutrophils Relative %: 81 % — ABNORMAL HIGH (ref 43–77)

## 2012-03-30 LAB — GLUCOSE, CAPILLARY
Glucose-Capillary: 190 mg/dL — ABNORMAL HIGH (ref 70–99)
Glucose-Capillary: 176 mg/dL — ABNORMAL HIGH (ref 70–99)
Glucose-Capillary: 260 mg/dL — ABNORMAL HIGH (ref 70–99)

## 2012-03-30 LAB — BASIC METABOLIC PANEL
BUN: 11 mg/dL (ref 6–23)
GFR calc Af Amer: >90 mL/min (ref >90)
GFR calc non Af Amer: >90 mL/min (ref >90)
Potassium: 3.2 mEq/L — ABNORMAL LOW (ref 3.5–5.1)
Sodium: 131 mEq/L — ABNORMAL LOW (ref 135–145)

## 2012-03-30 LAB — URINE CULTURE
Colony Count: >=100000
Culture  Setup Time: 201305300232

## 2012-03-30 LAB — CARDIAC PANEL(CRET KIN+CKTOT+MB+TROPI)
CK, MB: 1.7 ng/mL (ref 0.3–4.0)
CK, MB: 1.3 ng/mL (ref 0.3–4.0)
CK, MB: 1.1 ng/mL (ref 0.3–4.0)
Troponin I: 0.43 ng/mL (ref <0.30)
Troponin I: <0.3 ng/mL (ref <0.30)

## 2012-03-30 LAB — INFLUENZA PANEL BY PCR (TYPE A & B): Influenza A By PCR: NEGATIVE

## 2012-03-30 LAB — LEGIONELLA ANTIGEN, URINE

## 2012-03-30 MED ORDER — POTASSIUM CHLORIDE CRYS ER 20 MEQ PO TBCR
40.0000 meq | EXTENDED_RELEASE_TABLET | ORAL | Status: AC
  Administered 2012-03-30 (×2): 40 meq via ORAL
  Filled 2012-03-30 (×2): qty 2

## 2012-03-30 MED ORDER — DILTIAZEM HCL 50 MG/10ML IV SOLN
10.0000 mg | Freq: Once | INTRAVENOUS | Status: AC
  Administered 2012-03-30: 10 mg via INTRAVENOUS
  Filled 2012-03-30: qty 2

## 2012-03-30 MED ORDER — AMIODARONE HCL IN DEXTROSE 360-4.14 MG/200ML-% IV SOLN
1.0000 mg/min | INTRAVENOUS | Status: AC
  Administered 2012-03-30 (×2): 1 mg/min via INTRAVENOUS
  Filled 2012-03-30 (×3): qty 200

## 2012-03-30 MED ORDER — AMIODARONE LOAD VIA INFUSION
150.0000 mg | Freq: Once | INTRAVENOUS | Status: AC
  Administered 2012-03-30: 150 mg via INTRAVENOUS
  Filled 2012-03-30: qty 83.34

## 2012-03-30 MED ORDER — PANTOPRAZOLE SODIUM 40 MG PO TBEC
40.0000 mg | DELAYED_RELEASE_TABLET | Freq: Every day | ORAL | Status: DC
  Administered 2012-03-30 – 2012-04-02 (×4): 40 mg via ORAL
  Filled 2012-03-30 (×6): qty 1

## 2012-03-30 MED ORDER — PRAVASTATIN SODIUM 40 MG PO TABS
40.0000 mg | ORAL_TABLET | Freq: Every day | ORAL | Status: DC
  Administered 2012-03-30 – 2012-04-02 (×4): 40 mg via ORAL
  Filled 2012-03-30 (×5): qty 1

## 2012-03-30 MED ORDER — DILTIAZEM LOAD VIA INFUSION
10.0000 mg | Freq: Once | INTRAVENOUS | Status: AC
  Administered 2012-03-30: 10 mg via INTRAVENOUS
  Filled 2012-03-30: qty 10

## 2012-03-30 MED ORDER — DILTIAZEM HCL 100 MG IV SOLR
5.0000 mg/h | INTRAVENOUS | Status: AC
  Administered 2012-03-30: 10 mg/h via INTRAVENOUS
  Administered 2012-03-30: 20 mg/h via INTRAVENOUS
  Filled 2012-03-30 (×4): qty 100

## 2012-03-30 MED ORDER — PIPERACILLIN-TAZOBACTAM 3.375 G IVPB
3.3750 g | Freq: Three times a day (TID) | INTRAVENOUS | Status: DC
  Administered 2012-03-30 – 2012-04-02 (×10): 3.375 g via INTRAVENOUS
  Filled 2012-03-30 (×13): qty 50

## 2012-03-30 MED ORDER — FUROSEMIDE 10 MG/ML IJ SOLN
40.0000 mg | Freq: Once | INTRAMUSCULAR | Status: AC
  Administered 2012-03-30: 40 mg via INTRAVENOUS
  Filled 2012-03-30: qty 4

## 2012-03-30 MED ORDER — AMIODARONE HCL IN DEXTROSE 360-4.14 MG/200ML-% IV SOLN
0.5000 mg/min | INTRAVENOUS | Status: DC
  Filled 2012-03-30: qty 200

## 2012-03-30 MED ORDER — SODIUM CHLORIDE 0.9 % IV BOLUS (SEPSIS)
500.0000 mL | Freq: Once | INTRAVENOUS | Status: AC
  Administered 2012-03-30: 500 mL via INTRAVENOUS

## 2012-03-30 MED ORDER — ALTEPLASE 100 MG IV SOLR
2.0000 mg | Freq: Once | INTRAVENOUS | Status: AC
  Administered 2012-03-30: 2 mg
  Filled 2012-03-30: qty 2

## 2012-03-30 MED ORDER — AMIODARONE HCL IN DEXTROSE 360-4.14 MG/200ML-% IV SOLN
60.0000 mg/h | INTRAVENOUS | Status: DC
  Administered 2012-03-30 – 2012-04-01 (×6): 60 mg/h via INTRAVENOUS
  Filled 2012-03-30 (×6): qty 200

## 2012-03-30 MED ORDER — IBUPROFEN 600 MG PO TABS
600.0000 mg | ORAL_TABLET | Freq: Four times a day (QID) | ORAL | Status: DC | PRN
  Administered 2012-04-01: 600 mg via ORAL
  Filled 2012-03-30: qty 1

## 2012-03-30 NOTE — Progress Notes (Signed)
Called ref HR 150's while sleeping at 2238 pt converted to sinus

## 2012-03-30 NOTE — Progress Notes (Signed)
Patient's vitals checked--Patient "feeling hot" and flushed. Temp 102.1, bp 155/81, pulse 164, resp 24, sats 93% ra. Patient without complaints. Paged MD after obtaining EKG that showed a fib with RVR. MD to come see patient.

## 2012-03-30 NOTE — Progress Notes (Signed)
Inpatient Diabetes Program Recommendations  AACE/ADA: New Consensus Statement on Inpatient Glycemic Control (2009)  Target Ranges:  Prepandial:   less than 140 mg/dL      Peak postprandial:   less than 180 mg/dL (1-2 hours)      Critically ill patients:  140 - 180 mg/dL   Reason for Visit: Fasting hyperglycemia  Inpatient Diabetes Program Recommendations Insulin - Basal: Fasting glucose high this am.  Please add Lantus 10 units daily or HS while here. HgbA1C: Please order to assess control athome.  Note: Thank you, Lenor Coffin, RN, CNS, Diabetes Coordinator [phone])

## 2012-03-30 NOTE — Progress Notes (Addendum)
Patient transferred to room 1420 for telemetry monitoring. Cardizem 10 mg bolus given by rapid response RN prior to transfer with no significant change in rate. Report given to RN on 4 east once patient transferred to room. Labs drawn from [facility].   Delay in transfer due to ICU RN asking MD about need for stepdown or telemetry bed since heart rate not going down after bolus of cardizem and remaining in 150-170's. Order still for telemetry, not stepdown.

## 2012-03-30 NOTE — Progress Notes (Signed)
Subjective: Patient reports that she feels better today than she did yesterday. Pt also looks better than she did yesterday. She states that her mouth feels dry. Pt denies any feeling of palpitations despite her heart rate at 150 on telemetry and on apical examination. Interval history: Pt had a conversion of HR and rhythm to A-Fib with RVR. She was started on a Cardizem gtt which has been titrated to max of 20 mcg/hr and stil Pt also had a increase in troponin to 0.4 without any chest pain. EKG showed A-fin with  RVR. Objective: Filed Vitals:   03/30/12 0617 03/30/12 0756 03/30/12 0806 03/30/12 1032  BP: 100/66  143/84 125/81  Pulse:   132 153  Temp:      TempSrc:      Resp:      Height:      Weight:      SpO2:  92%     Weight change: -1.519 kg (-3 lb 5.6 oz)  Intake/Output Summary (Last 24 hours) at 03/30/12 1106 Last data filed at 03/30/12 0934  Gross per 24 hour  Intake   1869 ml  Output   4200 ml  Net  -2331 ml    General: Alert, awake, oriented x3, in no acute distress. Family at bedside. HEENT: [place]/AT PEER[place], EOMI Neck: Trachea midline,  no masses, no thyromegal,y no JVD, no carotid bruit OROPHARYNX: Mucosa dry, No exudate/ erythema/lesions.  Heart: Irregularly irregular rhythm on auscultation, without murmurs, rubs, gallops, PMI non-displaced, no heaves or thrills on palpation. A-fib with RVR on telemetry Lungs: Clear to auscultation, no wheezing or rhonchi noted. No increased vocal fremitus resonant to percussion  Abdomen: Soft, nontender, nondistended, positive bowel sounds, no masses no hepatosplenomegaly noted..  Neuro: No focal neurological deficits noted cranial nerves II through XII grossly intact. DTRs 2+ bilaterally upper and lower extremities.  Lab Results:  Basename 03/30/12 0400 03/29/12 1800 03/29/12 1325  NA 131* 131* --  K 3.2* 2.9* --  CL 93* 96 --  CO2 25 24 --  GLUCOSE 225* 196* --  BUN 11 8 --  CREATININE 0.50 0.41* --  CALCIUM 8.4 8.1* --  MG --  -- 1.8  PHOS -- -- --    Basename 03/28/12 0852  AST 12  ALT 14  ALKPHOS 78  BILITOT 0.6  PROT 7.0  ALBUMIN 3.0*   No results found for this basename: LIPASE:2,AMYLASE:2 in the last 72 hours  Basename 03/30/12 0400 03/28/12 2042  WBC 11.0* 10.1  NEUTROABS 8.9* 8.7*  HGB 11.2* 10.2*  HCT 32.7* 29.8*  MCV 84.3 84.4  PLT 222 182    Basename 03/30/12 0400  CKTOTAL 83  CKMB 1.7  CKMBINDEX --  TROPONINI 0.43*   No components found with this basename: POCBNP:3 No results found for this basename: DDIMER:2 in the last 72 hours No results found for this basename: HGBA1C:2 in the last 72 hours No results found for this basename: CHOL:2,HDL:2,LDLCALC:2,TRIG:2,CHOLHDL:2,LDLDIRECT:2 in the last 72 hours No results found for this basename: TSH,T4TOTAL,FREET3,T3FREE,THYROIDAB in the last 72 hours No results found for this basename: VITAMINB12:2,FOLATE:2,FERRITIN:2,TIBC:2,IRON:2,RETICCTPCT:2 in the last 72 hours  Micro Results: Recent Results (from the past 240 hour(s))  CULTURE, BLOOD (ROUTINE X 2)     Status: Normal (Preliminary result)   Collection Time   03/28/12  9:20 AM      Component Value Range Status Comment   Specimen Description BLOOD LEFT HAND   Final    Special Requests BOTTLES DRAWN AEROBIC ONLY   Final  Culture  Setup Time [id number]   Final    Culture     Final    Value:        BLOOD CULTURE RECEIVED NO GROWTH TO DATE CULTURE WILL BE HELD FOR 5 DAYS BEFORE ISSUING A FINAL NEGATIVE REPORT   Report Status PENDING   Incomplete   CULTURE, BLOOD (ROUTINE X 2)     Status: Normal (Preliminary result)   Collection Time   03/28/12  9:20 AM      Component Value Range Status Comment   Specimen Description BLOOD LEFT HAND   Final    Special Requests BOTTLES DRAWN AEROBIC AND ANAEROBIC   Final    Culture  Setup Time [id number]   Final    Culture     Final    Value:        BLOOD CULTURE RECEIVED NO GROWTH TO DATE CULTURE WILL BE HELD FOR 5 DAYS BEFORE ISSUING A  FINAL NEGATIVE REPORT   Report Status PENDING   Incomplete   URINE CULTURE     Status: Normal   Collection Time   03/28/12 10:28 AM      Component Value Range Status Comment   Specimen Description URINE, CLEAN CATCH   Final    Special Requests NONE   Final    Culture  Setup Time [id number]   Final    Colony Count >=100,000 COLONIES/ML   Final    Culture     Final    Value: Multiple bacterial morphotypes present, none predominant. Suggest appropriate recollection if clinically indicated.   Report Status 03/30/2012 FINAL   Final     Studies/Results: Dg Chest 2 View  03/29/2012  *RADIOLOGY REPORT*  Clinical Data: Difficulty breathing.  Fever and chest pain  CHEST - 2 VIEW  Comparison: 03/28/2012  Findings: There is a left chest wall porta-catheter with tip in the SVC.  The heart size appears normal.  Interval development of airspace opacity within the perihilar right lower lobe appears new from previous exam.  Mild diffuse interstitial edema is present.  IMPRESSION:  1.  New right base opacity which may represent pneumonia. 2.  Interstitial edema.  Original Report Authenticated By: Rosealee Albee, M.D.   Dg Chest 2 View  03/28/2012  *RADIOLOGY REPORT*  Clinical Data: Cough, shortness of breath, weakness and fever.  CHEST - 2 VIEW  Comparison: None.  Findings: Trachea is midline.  Left IJ Port-A-Cath tip projects over the SVC.  Heart size within normal limits.  Question very mild interstitial prominence and indistinctness.  No definite pleural fluid. Surgical clips in the right axilla.  IMPRESSION: Mild interstitial prominence may be due to emphysema.  Difficult to exclude an acute process such as mild edema or viral pneumonia.  Original Report Authenticated By: Reyes Ivan, M.D.    Medications: I have reviewed the patient's current medications. Scheduled Meds:    . diltiazem  10 mg Intravenous Once  . diltiazem  10 mg Intravenous Once  . enoxaparin  40 mg Subcutaneous Q24H  .  furosemide  40 mg Intravenous Once  . furosemide  40 mg Intravenous Once  . furosemide  40 mg Intravenous Once  . glimepiride  2 mg Oral QAC supper  . insulin aspart  0-15 Units Subcutaneous TID WC  . insulin aspart  0-5 Units Subcutaneous QHS  . letrozole  2.5 mg Oral Daily  . levalbuterol  0.63 mg Nebulization Q6H  . levofloxacin (LEVAQUIN) IV  500 mg Intravenous Q24H  .  lisinopril  20 mg Oral Daily  . magnesium sulfate 1 - 4 g bolus IVPB  2 g Intravenous Once  . pantoprazole  40 mg Oral Q1200  . piperacillin-tazobactam (ZOSYN)  IV  3.375 g Intravenous Q8H  . potassium chloride  40 mEq Oral Q4H  . potassium chloride  40 mEq Oral Q2H  . potassium chloride  40 mEq Oral Once  . potassium chloride  40 mEq Oral Q2H  . pravastatin  40 mg Oral q1800  . sodium chloride  500 mL Intravenous Once  . DISCONTD: simvastatin  20 mg Oral q1800   Continuous Infusions:    . diltiazem (CARDIZEM) infusion 20 mg/hr (03/30/12 0843)  . DISCONTD: sodium chloride 100 mL/hr at 03/28/12 1857  . DISCONTD: sodium chloride 0.9 % 1,000 mL with potassium chloride 40 mEq infusion 100 mL/hr at 03/29/12 1156   PRN Meds:.acetaminophen, clonazePAM, ibuprofen Assessment/Plan: Patient Active Hospital Problem List:  A-fib with RVR (03/30/2012)   Assessment: Unsure of cause of conversion to A-fib. Likely secondary to acutely ill states in a setting of  over diuresis based on clinical examination.    Plan: will change to amiodarone gtt and give small bolus of fluids. Continue to cycle cardiac enzymes and await echo results. I have consulted Dr. Myrtis Ser- cardiology who will see patient today.  Community-acquired pneumonia (03/28/2012)   Assessment: The patient's work of breathing much improved today. She has still had fevers. Zosyn added last night. Will change Levaquin to Azithromycin.  Hyponatremia (03/28/2012)   Assessment: Hyponatremia improved with diuresis.   Plan:will continue to monitor  Dehydration  (03/28/2012)   Assessment: The patient now appears to be mildly hypovolemic. Will give a small bolus .  Bacteriuria with pyuria (03/28/2012)   Assessment:Cultures negative.  Hypokalemia (03/29/2012)   Assessment: continue repletion and supplementation  Mildly Elevated Troponin (03/30/2012)   Assessment: Likely from demand secondary to elevated HR. Will continue to cycle enzymes.   LOS: 2 days

## 2012-03-30 NOTE — Consult Note (Signed)
CARDIOLOGY CONSULT NOTE  Patient ID: Darlene Harrell MRN: [id number] DOB/AGE: 12/29/1940 71 y.o.  Admit date: 03/28/2012 Referring Physician Primary PhysicianKatherine Beverely Low, MD, MD Primary Cardiologist  Karlene Lineman Reason for Consultation  HPI: The patient is here with a febrile illness, myalgias, bacteria, and possible pneumonia. She's developed rapid atrial fibrillation. There is no prior cardiac history. The patient has just moved to this area from [place]. She also is hyponatremic and hypokalemic. She was admitted with sinus rhythm. She developed atrial fibrillation while in the hospital. She's not having any chest pain.  Review of systems:   An in and in patient denies headache, sweats, rash, change in vision, change in hearing, nausea vomiting. All other systems are reviewed and are negative.  Past Medical History  Diagnosis Date  . Cancer   . Diabetes mellitus   . Hypertension   . Depression   . Syncopal episodes   . Arthritis   . High cholesterol   . Blood in stool     occasional  . Chicken pox   . Fainting     Family History  Problem Relation Age of Onset  . Stroke Mother   . Hypertension Mother   . Mental retardation Mother   . Diabetes Mother   . Alcohol abuse Father   . Hypertension Father   . Diabetes Father   . Diabetes Son   . Kidney failure Son   . Early death Son   . Hyperlipidemia Son     History   Social History  . Marital Status: Divorced    Spouse Name: N/A    Number of Children: N/A  . Years of Education: N/A   Occupational History  . Not on file.   Social History Main Topics  . Smoking status: Current Everyday Smoker -- 1.0 packs/day for 48 years    Types: Cigarettes  . Smokeless tobacco: Never Used  . Alcohol Use: Yes     4 drinks a year  . Drug Use: No  . Sexually Active: No   Other Topics Concern  . Not on file   Social History Narrative  . No narrative on file    Past Surgical History  Procedure Date    . Tonsillectomy   . Breast surgery 2010 and 2011     biopsy and noted mastectomy     Prescriptions prior to admission  Medication Sig Dispense Refill  . clonazePAM (KLONOPIN) 0.5 MG tablet Take 1 tablet (0.5 mg total) by mouth at bedtime as needed.  30 tablet  1  . glimepiride (AMARYL) 2 MG tablet Take 1 tablet (2 mg total) by mouth at bedtime.  30 tablet  1  . letrozole (FEMARA) 2.5 MG tablet Take 2.5 mg by mouth daily.      Marland Kitchen lisinopril-hydrochlorothiazide (PRINZIDE,ZESTORETIC) 20-12.5 MG per tablet Take 1 tablet by mouth daily.  30 tablet  1  . metFORMIN (GLUCOPHAGE) 500 MG tablet Take 1 tablet (500 mg total) by mouth 2 (two) times daily with a meal.  60 tablet  1  . pravastatin (PRAVACHOL) 40 MG tablet Take 1 tablet (40 mg total) by mouth daily.  30 tablet  1    Physical Exam: Blood pressure 136/75, pulse 130, temperature 99.2 F (37.3 C), temperature source Oral, resp. rate 22, height 5\' 3"  (1.6 m), weight 196 lb 10.4 oz (89.2 kg), SpO2 92.00%.  Patient is stable in the room. Some of her family is with her. She is  lying flat in bed. She had some shortness of breath earlier today. She is oriented to person time and place. Affect is normal. Lungs reveal rhonchi. There is no jugulovenous distention. Cardiac exam reveals S1 and S2. There no clicks or significant murmurs. The rhythm is irregularly irregular. The abdomen is soft. There is no definite peripheral edema.   Labs:   Lab Results  Component Value Date   WBC 11.0* 03/30/2012   HGB 11.2* 03/30/2012   HCT 32.7* 03/30/2012   MCV 84.3 03/30/2012   PLT 222 03/30/2012    Lab 03/30/12 0400 03/28/12 0852  NA 131* --  K 3.2* --  CL 93* --  CO2 25 --  BUN 11 --  CREATININE 0.50 --  CALCIUM 8.4 --  PROT -- 7.0  BILITOT -- 0.6  ALKPHOS -- 78  ALT -- 14  AST -- 12  GLUCOSE 225* --   Lab Results  Component Value Date   CKTOTAL 62 03/30/2012   CKMB 1.3 03/30/2012   TROPONINI <0.30 03/30/2012    No results found for this  basename: CHOL   No results found for this basename: HDL   No results found for this basename: LDLCALC   No results found for this basename: TRIG   No results found for this basename: CHOLHDL   No results found for this basename: LDLDIRECT      Radiology: Dg Chest 2 View  03/29/2012  *RADIOLOGY REPORT*  Clinical Data: Difficulty breathing.  Fever and chest pain  CHEST - 2 VIEW  Comparison: 03/28/2012  Findings: There is a left chest wall porta-catheter with tip in the SVC.  The heart size appears normal.  Interval development of airspace opacity within the perihilar right lower lobe appears new from previous exam.  Mild diffuse interstitial edema is present.  IMPRESSION:  1.  New right base opacity which may represent pneumonia. 2.  Interstitial edema.  Original Report Authenticated By: Rosealee Albee, M.D.   EKG:  I reviewed the EKGs. The initial one shows sinus rhythm. Followup EKG revealed atrial fibrillation. The current telemetry reveals atrial fibrillation.  ASSESSMENT AND PLAN:      Breast cancer   There is a history of breast cancer. The patient has been treated. She does still have a Port-A-Cath in her left anterior chest.   Hyponatremia    This should improve with rehydration.   Bacteriuria with pyuria  This is being treated with antibiotics.   Atrial fibrillation   Atrial fibrillation seems to be new. The patient gives no prior history of a. However she cannot feel her rapid heart rate at this time. The rate is increased. I agree with the use of Cardizem. In addition since the rate remains elevated I agree with the use of IV amiodarone. Anticoagulation would be appropriate if this is safe for her. Two-dimensional echo has been ordered. I do not see TSH. I will order this if it's not already done. We will watch the patient's rhythm on IV medications as her other medical problems are treated.  Further decisions will then be made based on her course. The patient's BNP is  elevated. It is possible that this is related to her atrial fibrillation and her other illnesses. We will await 2-D echo data for more input.   Pneumonia   The most recent chest x-ray raises the question of developing pneumonia. There is also question that this could be from fluid overload. Considering the fever and other issues, it seems that possible  pneumonia is more likely. I certainly agree that I would not try to diurese her at this point today.  Jerral Bonito, MD  03/30/2012, 12:54 PM

## 2012-03-30 NOTE — Progress Notes (Signed)
*  PRELIMINARY RESULTS* Echocardiogram 2D Echocardiogram has been performed.  Darlene Harrell [facility] 03/30/2012, 12:40 PM

## 2012-03-30 NOTE — Progress Notes (Signed)
K 2.9 and BNP 3446. Paged MD covering floor. Orders received for lasix and additional K dur tablets.

## 2012-03-30 NOTE — Progress Notes (Signed)
MEDICATION RELATED CONSULT NOTE - INITIAL   Pharmacy Consult for Amiodarone  Indication: A-Fib  No Known Allergies  Patient Measurements: Height: 5\' 3"  (160 cm) Weight: 196 lb 10.4 oz (89.2 kg) IBW/kg (Calculated) : 52.4   Vital Signs: Temp: 99.2 F (37.3 C) (05/31 0409) Temp src: Oral (05/31 0409) BP: 125/81 mmHg (05/31 1032) Pulse Rate: 153  (05/31 1032) Intake/Output from previous day: 05/30 0701 - 05/31 0700 In: 1869 [P.O.:840; I.V.:1020] Out: 4200 [Urine:4200] Intake/Output from this shift: Total I/O In: 240 [P.O.:240] Out: -   Labs:  Basename 03/30/12 0400 03/29/12 1800 03/29/12 1325 03/28/12 2042 03/28/12 0852  WBC 11.0* -- -- 10.1 11.0*  HGB 11.2* -- -- 10.2* 11.7*  HCT 32.7* -- -- 29.8* 34.7*  PLT 222 -- -- 182 210  APTT -- -- -- -- --  CREATININE 0.50 0.41* 0.40* -- --  LABCREA -- -- -- -- --  CREATININE 0.50 0.41* 0.40* -- --  CREAT24HRUR -- -- -- -- --  MG -- -- 1.8 -- --  PHOS -- -- -- -- --  ALBUMIN -- -- -- -- 3.0*  PROT -- -- -- -- 7.0  ALBUMIN -- -- -- -- 3.0*  AST -- -- -- -- 12  ALT -- -- -- -- 14  ALKPHOS -- -- -- -- 78  BILITOT -- -- -- -- 0.6  BILIDIR -- -- -- -- --  IBILI -- -- -- -- --   Estimated Creatinine Clearance: 71.3 ml/min (by C-G formula based on Cr of 0.5).   Microbiology: Recent Results (from the past 720 hour(s))  CULTURE, BLOOD (ROUTINE X 2)     Status: Normal (Preliminary result)   Collection Time   03/28/12  9:20 AM      Component Value Range Status Comment   Specimen Description BLOOD LEFT HAND   Final    Special Requests BOTTLES DRAWN AEROBIC ONLY   Final    Culture  Setup Time [id number]   Final    Culture     Final    Value:        BLOOD CULTURE RECEIVED NO GROWTH TO DATE CULTURE WILL BE HELD FOR 5 DAYS BEFORE ISSUING A FINAL NEGATIVE REPORT   Report Status PENDING   Incomplete   CULTURE, BLOOD (ROUTINE X 2)     Status: Normal (Preliminary result)   Collection Time   03/28/12  9:20 AM      Component  Value Range Status Comment   Specimen Description BLOOD LEFT HAND   Final    Special Requests BOTTLES DRAWN AEROBIC AND ANAEROBIC   Final    Culture  Setup Time [id number]   Final    Culture     Final    Value:        BLOOD CULTURE RECEIVED NO GROWTH TO DATE CULTURE WILL BE HELD FOR 5 DAYS BEFORE ISSUING A FINAL NEGATIVE REPORT   Report Status PENDING   Incomplete   URINE CULTURE     Status: Normal   Collection Time   03/28/12 10:28 AM      Component Value Range Status Comment   Specimen Description URINE, CLEAN CATCH   Final    Special Requests NONE   Final    Culture  Setup Time [id number]   Final    Colony Count >=100,000 COLONIES/ML   Final    Culture     Final    Value: Multiple bacterial morphotypes present, none predominant. Suggest appropriate recollection if clinically  indicated.   Report Status 03/30/2012 FINAL   Final     Medical History: Past Medical History  Diagnosis Date  . Cancer   . Diabetes mellitus   . Hypertension   . Depression   . Syncopal episodes   . Arthritis   . High cholesterol   . Blood in stool     occasional  . Chicken pox   . Fainting     Medications:  Scheduled:    . amiodarone  150 mg Intravenous Once  . diltiazem  10 mg Intravenous Once  . diltiazem  10 mg Intravenous Once  . enoxaparin  40 mg Subcutaneous Q24H  . furosemide  40 mg Intravenous Once  . furosemide  40 mg Intravenous Once  . furosemide  40 mg Intravenous Once  . glimepiride  2 mg Oral QAC supper  . insulin aspart  0-15 Units Subcutaneous TID WC  . insulin aspart  0-5 Units Subcutaneous QHS  . letrozole  2.5 mg Oral Daily  . levalbuterol  0.63 mg Nebulization Q6H  . levofloxacin (LEVAQUIN) IV  500 mg Intravenous Q24H  . lisinopril  20 mg Oral Daily  . magnesium sulfate 1 - 4 g bolus IVPB  2 g Intravenous Once  . pantoprazole  40 mg Oral Q1200  . piperacillin-tazobactam (ZOSYN)  IV  3.375 g Intravenous Q8H  . potassium chloride  40 mEq Oral Q4H  . potassium  chloride  40 mEq Oral Q2H  . potassium chloride  40 mEq Oral Once  . potassium chloride  40 mEq Oral Q2H  . pravastatin  40 mg Oral q1800  . sodium chloride  500 mL Intravenous Once  . DISCONTD: simvastatin  20 mg Oral q1800   Infusions:    . amiodarone (NEXTERONE PREMIX) 360 mg/200 mL dextrose     Followed by  . amiodarone (NEXTERONE PREMIX) 360 mg/200 mL dextrose    . diltiazem (CARDIZEM) infusion 20 mg/hr (03/30/12 0843)  . DISCONTD: sodium chloride 100 mL/hr at 03/28/12 1857  . DISCONTD: sodium chloride 0.9 % 1,000 mL with potassium chloride 40 mEq infusion 100 mL/hr at 03/29/12 1156    Assessment: 71 yo F admitted 5/29 with PNA. On diltiazem drip for Afib, now with orders from Dr. Ashley Royalty to switch from diltiazem to amiodarone. Discussed this with Dr. Ashley Royalty who is in agreement with: Pharmacy will initiate usual AFib amio orders (150mg  IV x1, then 1mg /min x6 hours, then 0.5mg /min infusion) with further dose adjustments/titrations per MD. Pharmacy will sign-off officially. Cards has been consulted.  Of note, pt is also on Levaquin, which can further increase the QTc when given with Amiodarone.  Plan:  1) D/C cardizem drip (v.o. Dr. Ashley Royalty) 2) Amiodarone 150mg  IV x1, then 1mg /min x6 hours, then 0.5mg /min infusion 3) Per Amiodarone protocol/orderset, have also ordered BMET x3 days, Mg x1 tomorrow am, and TSH x1 today (ordered by MD) 4) Pharmacy will now sign-off - Further amiodarone dose adjustments per MD  Darrol Angel, PharmD Pager: [phone] 03/30/2012,11:21 AM

## 2012-03-30 NOTE — Progress Notes (Signed)
Patient transferred to stepdown, report given to [facility].

## 2012-03-31 DIAGNOSIS — D696 Thrombocytopenia, unspecified: Secondary | ICD-10-CM

## 2012-03-31 DIAGNOSIS — I4891 Unspecified atrial fibrillation: Secondary | ICD-10-CM

## 2012-03-31 DIAGNOSIS — E876 Hypokalemia: Secondary | ICD-10-CM

## 2012-03-31 DIAGNOSIS — E871 Hypo-osmolality and hyponatremia: Secondary | ICD-10-CM

## 2012-03-31 LAB — CARDIAC PANEL(CRET KIN+CKTOT+MB+TROPI)
CK, MB: 1.3 ng/mL (ref 0.3–4.0)
CK, MB: 1.3 ng/mL (ref 0.3–4.0)
Relative Index: INVALID (ref 0.0–2.5)
Total CK: 61 U/L (ref 7–177)
Troponin I: <0.3 ng/mL (ref <0.30)
Troponin I: <0.3 ng/mL (ref <0.30)
Troponin I: <0.3 ng/mL (ref <0.30)

## 2012-03-31 LAB — BASIC METABOLIC PANEL
BUN: 13 mg/dL (ref 6–23)
CO2: 26 mEq/L (ref 19–32)
Calcium: 8.8 mg/dL (ref 8.4–10.5)
GFR calc non Af Amer: >90 mL/min (ref >90)
Glucose, Bld: 161 mg/dL — ABNORMAL HIGH (ref 70–99)

## 2012-03-31 LAB — DIFFERENTIAL
Basophils Absolute: 0 10*3/uL (ref 0.0–0.1)
Basophils Relative: 0 % (ref 0–1)
Eosinophils Absolute: 0 10*3/uL (ref 0.0–0.7)
Monocytes Absolute: 1.2 10*3/uL — ABNORMAL HIGH (ref 0.1–1.0)
Monocytes Relative: 11 % (ref 3–12)
Neutrophils Relative %: 79 % — ABNORMAL HIGH (ref 43–77)

## 2012-03-31 LAB — POTASSIUM: Potassium: 3.4 mEq/L — ABNORMAL LOW (ref 3.5–5.1)

## 2012-03-31 LAB — TSH: TSH: 0.015 u[IU]/mL — ABNORMAL LOW (ref 0.350–4.500)

## 2012-03-31 LAB — GLUCOSE, CAPILLARY
Glucose-Capillary: 126 mg/dL — ABNORMAL HIGH (ref 70–99)
Glucose-Capillary: 178 mg/dL — ABNORMAL HIGH (ref 70–99)

## 2012-03-31 LAB — CBC
Hemoglobin: 10.6 g/dL — ABNORMAL LOW (ref 12.0–15.0)
MCH: 28.5 pg (ref 26.0–34.0)
MCHC: 34 g/dL (ref 30.0–36.0)
RDW: 14.1 % (ref 11.5–15.5)

## 2012-03-31 MED ORDER — DEXTROSE 5 % IV SOLN
500.0000 mg | INTRAVENOUS | Status: DC
  Administered 2012-03-31 – 2012-04-01 (×2): 500 mg via INTRAVENOUS
  Filled 2012-03-31 (×3): qty 500

## 2012-03-31 MED ORDER — LORAZEPAM 2 MG/ML IJ SOLN
1.0000 mg | Freq: Once | INTRAMUSCULAR | Status: AC
  Administered 2012-03-31: 1 mg via INTRAVENOUS
  Filled 2012-03-31: qty 1

## 2012-03-31 MED ORDER — POTASSIUM CHLORIDE CRYS ER 20 MEQ PO TBCR
40.0000 meq | EXTENDED_RELEASE_TABLET | ORAL | Status: AC
  Administered 2012-03-31 (×3): 40 meq via ORAL
  Filled 2012-03-31 (×3): qty 2

## 2012-03-31 NOTE — Progress Notes (Signed)
Subjective: Patient reports that she feels better today than she did yesterday.   Interval history: Pt has converted to sinus rhythm. Her heart rate is now down into the 90s on 60 mg/min of amiodarone. Patient's potassium is also noted to be low at 2.8 this morning. Please note magnesium is normal at 2.0.  Objective: Filed Vitals:   03/31/12 1411 03/31/12 1600 03/31/12 1655 03/31/12 1725  BP:   137/70   Pulse:   96   Temp:  100.1 F (37.8 C)  99.5 F (37.5 C)  TempSrc:  Oral  Oral  Resp:   24   Height:      Weight:      SpO2: 94%  94%    Weight change: 0.6 kg (1 lb 5.2 oz)  Intake/Output Summary (Last 24 hours) at 03/31/12 1727 Last data filed at 03/31/12 1434  Gross per 24 hour  Intake 1716.4 ml  Output   1150 ml  Net  566.4 ml    General: Alert, awake, oriented x3, in no acute distress. Family at bedside. HEENT: [place]/AT PEERL, EOMI Neck: Trachea midline,  no masses, no thyromegal,y no JVD, no carotid bruit OROPHARYNX: Mucosa moist. No exudate/ erythema/lesions.  Heart: Irregularly irregular rhythm on auscultation, without murmurs, rubs, gallops, PMI non-displaced, no heaves or thrills on palpation. A-fib with RVR on telemetry Lungs: Clear to auscultation, no wheezing or rhonchi noted. No increased vocal fremitus resonant to percussion  Abdomen: Soft, nontender, nondistended, positive bowel sounds, no masses no hepatosplenomegaly noted..  Neuro: No focal neurological deficits noted cranial nerves II through XII grossly intact. DTRs 2+ bilaterally upper and lower extremities.  Lab Results:  Basename 03/31/12 0321 03/30/12 0400 03/29/12 1325  NA 132* 131* --  K 2.8* 3.2* --  CL 95* 93* --  CO2 26 25 --  GLUCOSE 161* 225* --  BUN 13 11 --  CREATININE 0.50 0.50 --  CALCIUM 8.8 8.4 --  MG 2.0 -- 1.8  PHOS -- -- --   No results found for this basename: AST:2,ALT:2,ALKPHOS:2,BILITOT:2,PROT:2,ALBUMIN:2 in the last 72 hours No results found for this basename:  LIPASE:2,AMYLASE:2 in the last 72 hours  Basename 03/31/12 0321 03/30/12 0400  WBC 10.6* 11.0*  NEUTROABS 8.3* 8.9*  HGB 10.6* 11.2*  HCT 31.2* 32.7*  MCV 83.9 84.3  PLT 240 222    Basename 03/31/12 1047 03/31/12 0321 03/30/12 1829  CKTOTAL 50 50 59  CKMB 1.3 1.0 1.1  CKMBINDEX -- -- --  TROPONINI <0.30 <0.30 <0.30   No components found with this basename: POCBNP:3 No results found for this basename: DDIMER:2 in the last 72 hours No results found for this basename: HGBA1C:2 in the last 72 hours No results found for this basename: CHOL:2,HDL:2,LDLCALC:2,TRIG:2,CHOLHDL:2,LDLDIRECT:2 in the last 72 hours  Basename 03/31/12 0321  TSH 0.015*  T4TOTAL --  T3FREE --  THYROIDAB --   No results found for this basename: VITAMINB12:2,FOLATE:2,FERRITIN:2,TIBC:2,IRON:2,RETICCTPCT:2 in the last 72 hours  Micro Results: Recent Results (from the past 240 hour(s))  CULTURE, BLOOD (ROUTINE X 2)     Status: Normal (Preliminary result)   Collection Time   03/28/12  9:20 AM      Component Value Range Status Comment   Specimen Description BLOOD LEFT HAND   Final    Special Requests BOTTLES DRAWN AEROBIC ONLY   Final    Culture  Setup Time [id number]   Final    Culture     Final    Value:  BLOOD CULTURE RECEIVED NO GROWTH TO DATE CULTURE WILL BE HELD FOR 5 DAYS BEFORE ISSUING A FINAL NEGATIVE REPORT   Report Status PENDING   Incomplete   CULTURE, BLOOD (ROUTINE X 2)     Status: Normal (Preliminary result)   Collection Time   03/28/12  9:20 AM      Component Value Range Status Comment   Specimen Description BLOOD LEFT HAND   Final    Special Requests BOTTLES DRAWN AEROBIC AND ANAEROBIC   Final    Culture  Setup Time [id number]   Final    Culture     Final    Value:        BLOOD CULTURE RECEIVED NO GROWTH TO DATE CULTURE WILL BE HELD FOR 5 DAYS BEFORE ISSUING A FINAL NEGATIVE REPORT   Report Status PENDING   Incomplete   URINE CULTURE     Status: Normal   Collection Time     03/28/12 10:28 AM      Component Value Range Status Comment   Specimen Description URINE, CLEAN CATCH   Final    Special Requests NONE   Final    Culture  Setup Time [id number]   Final    Colony Count >=100,000 COLONIES/ML   Final    Culture     Final    Value: Multiple bacterial morphotypes present, none predominant. Suggest appropriate recollection if clinically indicated.   Report Status 03/30/2012 FINAL   Final   MRSA PCR SCREENING     Status: Normal   Collection Time   03/30/12  2:29 PM      Component Value Range Status Comment   MRSA by PCR NEGATIVE  NEGATIVE  Final     Studies/Results: Dg Chest 2 View  03/29/2012  *RADIOLOGY REPORT*  Clinical Data: Difficulty breathing.  Fever and chest pain  CHEST - 2 VIEW  Comparison: 03/28/2012  Findings: There is a left chest wall porta-catheter with tip in the SVC.  The heart size appears normal.  Interval development of airspace opacity within the perihilar right lower lobe appears new from previous exam.  Mild diffuse interstitial edema is present.  IMPRESSION:  1.  New right base opacity which may represent pneumonia. 2.  Interstitial edema.  Original Report Authenticated By: Rosealee Albee, M.D.   Dg Chest 2 View  03/28/2012  *RADIOLOGY REPORT*  Clinical Data: Cough, shortness of breath, weakness and fever.  CHEST - 2 VIEW  Comparison: None.  Findings: Trachea is midline.  Left IJ Port-A-Cath tip projects over the SVC.  Heart size within normal limits.  Question very mild interstitial prominence and indistinctness.  No definite pleural fluid. Surgical clips in the right axilla.  IMPRESSION: Mild interstitial prominence may be due to emphysema.  Difficult to exclude an acute process such as mild edema or viral pneumonia.  Original Report Authenticated By: Reyes Ivan, M.D.    Medications: I have reviewed the patient's current medications. Scheduled Meds:    . amiodarone  150 mg Intravenous Once  . enoxaparin  40 mg Subcutaneous  Q24H  . glimepiride  2 mg Oral QAC supper  . insulin aspart  0-15 Units Subcutaneous TID WC  . insulin aspart  0-5 Units Subcutaneous QHS  . letrozole  2.5 mg Oral Daily  . levalbuterol  0.63 mg Nebulization Q6H  . levofloxacin (LEVAQUIN) IV  500 mg Intravenous Q24H  . lisinopril  20 mg Oral Daily  . LORazepam  1 mg Intravenous Once  . pantoprazole  40 mg Oral Q1200  . piperacillin-tazobactam (ZOSYN)  IV  3.375 g Intravenous Q8H  . potassium chloride  40 mEq Oral Q2H  . pravastatin  40 mg Oral q1800   Continuous Infusions:    . amiodarone (NEXTERONE PREMIX) 360 mg/200 mL dextrose 1 mg/min (03/30/12 1552)   Followed by  . amiodarone (NEXTERONE PREMIX) 360 mg/200 mL dextrose 60 mg/hr (03/31/12 1547)  . DISCONTD: amiodarone (NEXTERONE PREMIX) 360 mg/200 mL dextrose 0.5 mg/min (03/30/12 1736)   PRN Meds:.acetaminophen, clonazePAM, ibuprofen Assessment/Plan: Patient Active Hospital Problem List:  A-fib with RVR (03/30/2012)   Assessment: Patient now converted back to sinus rhythm while on amiodarone drip. I will defer to cardiology on the management of this.   Community-acquired pneumonia (03/28/2012)   Assessment: The patient's work of breathing much improved today. She has still had fevers. Zosyn added last night. Will change Levaquin to Azithromycin.  Hyponatremia (03/28/2012)   Assessment: Hyponatremia improved with diuresis.   Plan:will continue to monitor  Dehydration (03/28/2012)   Assessment: The patient now appears to be mildly hypovolemic. Will give a small bolus .  Bacteriuria with pyuria (03/28/2012)   Assessment:Cultures negative.  Hypokalemia (03/29/2012)   Assessment: continue repletion and supplementation  Mildly Elevated Troponin (03/30/2012)   Assessment: Subsequent troponins normal. Likely secondary to demand state.   LOS: 3 days

## 2012-03-31 NOTE — Progress Notes (Signed)
Subjective:  K was low and is being repleted.  No CP, no SOB. Converted to NSR.   Objective:  Vital Signs in the last 24 hours: Temp:  [99.4 F (37.4 C)-101.4 F (38.6 C)] 99.4 F (37.4 C) (06/01 0800) Pulse Rate:  [101-153] 101  (06/01 0940) Resp:  [20-32] 25  (06/01 0940) BP: (127-160)/(63-85) 142/65 mmHg (06/01 0940) SpO2:  [91 %-96 %] 91 % (06/01 0940) Weight:  [89.8 kg (197 lb 15.6 oz)] 89.8 kg (197 lb 15.6 oz) (06/01 0500)  Intake/Output from previous day: 05/31 0701 - 06/01 0700 In: 1043.3 [P.O.:240; I.V.:753.3; IV Piggyback:50] Out: 750 [Urine:750]   Physical Exam: General: Well developed, well nourished, obese, mildly ill appearing Head:  Normocephalic and atraumatic. Lungs: Clear to auscultation and percussion. Heart: Tachy RR.  No murmur, rubs or gallops.  Pulses: Pulses normal in all 4 extremities. Port in place Abdomen: soft, non-tender, positive bowel sounds.Obese Extremities: No clubbing or cyanosis. No edema. Thick extrem.  Neurologic: Alert and oriented x 3.    Lab Results:  Basename 03/31/12 0321 03/30/12 0400  WBC 10.6* 11.0*  HGB 10.6* 11.2*  PLT 240 222    Basename 03/31/12 0321 03/30/12 0400  NA 132* 131*  K 2.8* 3.2*  CL 95* 93*  CO2 26 25  GLUCOSE 161* 225*  BUN 13 11  CREATININE 0.50 0.50    Basename 03/31/12 0321 03/30/12 1829  TROPONINI <0.30 <0.30     Telemetry: NSR Personally viewed.   Cardiac Studies:  ECHO - Left ventricle: The cavity size was normal. Wall thickness was increased in a pattern of mild LVH. Systolic function was normal. The estimated ejection fraction was in the range of 60% to 65%. Wall motion was normal; there were no regional wall motion abnormalities. The study is not technically sufficient to allow evaluation of LV diastolic function. - Mitral valve: Mild regurgitation. - Left atrium: The atrium was mildly dilated. - Pulmonary arteries: PA peak pressure: 32mm Hg (S).    Assessment/Plan:  Active  Problems:  Breast cancer  Hyponatremia  Dehydration  Bacteriuria with pyuria  Atrial fibrillation  Pneumonia  AFIB  - now in NSR as her infection improves  - Will keep amiodarone at 1mg /min today with hopeful transition to PO tomorrow.   - Goal will be to discontinue amiodarone soon.   - ECHO reassuring, mild LA dil. Normal EF  - TSH is 0.015. Is she hyperthyroid? Needs further workup. Beta blocker would be helpful in this situation.   - BNP 3097 - atrial stretch. Keep fluid balance overall even. Likely a degree of diastolic dysfunction.   HYPONATREMIA  - replete per primary team  PNA  - ABX  HYPONATREMIA  - 132. No change.   Elevated troponin  - now normal.      Darlene Harrell 03/31/2012, 10:49 AM

## 2012-03-31 NOTE — Progress Notes (Signed)
Pt request something to help her sleep the rest of the night after lab drew blood. Orders received

## 2012-04-01 DIAGNOSIS — R946 Abnormal results of thyroid function studies: Secondary | ICD-10-CM

## 2012-04-01 DIAGNOSIS — E871 Hypo-osmolality and hyponatremia: Secondary | ICD-10-CM

## 2012-04-01 DIAGNOSIS — D696 Thrombocytopenia, unspecified: Secondary | ICD-10-CM

## 2012-04-01 LAB — DIFFERENTIAL
Basophils Absolute: 0 10*3/uL (ref 0.0–0.1)
Basophils Relative: 0 % (ref 0–1)
Monocytes Absolute: 1.2 10*3/uL — ABNORMAL HIGH (ref 0.1–1.0)
Neutro Abs: 7.6 10*3/uL (ref 1.7–7.7)
Neutrophils Relative %: 77 % (ref 43–77)

## 2012-04-01 LAB — BASIC METABOLIC PANEL
Chloride: 97 mEq/L (ref 96–112)
GFR calc Af Amer: >90 mL/min (ref >90)
Potassium: 3.7 mEq/L (ref 3.5–5.1)
Sodium: 131 mEq/L — ABNORMAL LOW (ref 135–145)

## 2012-04-01 LAB — CBC
HCT: 29.2 % — ABNORMAL LOW (ref 36.0–46.0)
MCHC: 33.6 g/dL (ref 30.0–36.0)
Platelets: 193 10*3/uL (ref 150–400)
RDW: 14.6 % (ref 11.5–15.5)

## 2012-04-01 LAB — T3, FREE: T3, Free: 2.3 pg/mL (ref 2.3–4.2)

## 2012-04-01 LAB — GLUCOSE, CAPILLARY: Glucose-Capillary: 180 mg/dL — ABNORMAL HIGH (ref 70–99)

## 2012-04-01 MED ORDER — INSULIN GLARGINE 100 UNIT/ML ~~LOC~~ SOLN
10.0000 [IU] | Freq: Every day | SUBCUTANEOUS | Status: DC
  Administered 2012-04-01 – 2012-04-02 (×2): 10 [IU] via SUBCUTANEOUS

## 2012-04-01 MED ORDER — LEVALBUTEROL HCL 0.63 MG/3ML IN NEBU
0.6300 mg | INHALATION_SOLUTION | Freq: Four times a day (QID) | RESPIRATORY_TRACT | Status: DC | PRN
  Filled 2012-04-01: qty 3

## 2012-04-01 MED ORDER — AMIODARONE HCL 200 MG PO TABS
400.0000 mg | ORAL_TABLET | Freq: Three times a day (TID) | ORAL | Status: DC
  Administered 2012-04-01 – 2012-04-02 (×6): 400 mg via ORAL
  Filled 2012-04-01 (×9): qty 2

## 2012-04-01 MED ORDER — LEVALBUTEROL HCL 0.63 MG/3ML IN NEBU
0.6300 mg | INHALATION_SOLUTION | Freq: Two times a day (BID) | RESPIRATORY_TRACT | Status: DC
  Administered 2012-04-01 – 2012-04-03 (×4): 0.63 mg via RESPIRATORY_TRACT
  Filled 2012-04-01 (×7): qty 3

## 2012-04-01 NOTE — Progress Notes (Signed)
04-01-12  NSG: I have just d/c'd q8 hour cardiac enzymes.  They were ordered q8 x3 but had an "until specified" end date with 44 more occurences.  Her last 5sets of enzymes were negative, pt denies cp, sob, has converted to nsr  With a rate in the 90's, both K+ and NA are improved.  Doctor if you want to continue q8 hour cardiac enzymes please re-order

## 2012-04-01 NOTE — Progress Notes (Signed)
Subjective: Patient looks well and states that she feels very well today.  Interval history: Noted TSH low at 0.015. Free T4 and free T3 pending  Objective: Filed Vitals:   04/01/12 0100 04/01/12 0603 04/01/12 1021 04/01/12 1330  BP: 155/88 158/96  123/76  Pulse: 91 91  74  Temp: 98 F (36.7 C) 98.3 F (36.8 C)  98.3 F (36.8 C)  TempSrc: Axillary Axillary  Oral  Resp: 18 18  18   Height:      Weight:  91.4 kg (201 lb 8 oz)    SpO2: 92% 91% 93% 94%   Weight change: 1.6 kg (3 lb 8.4 oz)  Intake/Output Summary (Last 24 hours) at 04/01/12 1528 Last data filed at 04/01/12 1200  Gross per 24 hour  Intake   1446 ml  Output   1501 ml  Net    -55 ml    General: Alert, awake, oriented x3, in no acute distress. HEENT: [place]/AT PEERL, EOMI Neck: Trachea midline,  no masses, no thyromegal,y no JVD, no carotid bruit OROPHARYNX: Mucosa moist. No exudate/ erythema/lesions.  Heart: S1-S2 normal, no murmurs rubs or gallops noted. PMI nondisplaced. Lungs: Clear to auscultation, no wheezing or rhonchi noted. No increased vocal fremitus resonant to percussion  Abdomen: Soft, nontender, nondistended, positive bowel sounds, no masses no hepatosplenomegaly noted..  Neuro: No focal neurological deficits noted cranial nerves II through XII grossly intact. DTRs 2+ bilaterally upper and lower extremities.  Lab Results:  Basename 04/01/12 0547 03/31/12 1810 03/31/12 0321  NA 131* -- 132*  K 3.7 3.4* --  CL 97 -- 95*  CO2 25 -- 26  GLUCOSE 194* -- 161*  BUN 9 -- 13  CREATININE 0.45* -- 0.50  CALCIUM 8.3* -- 8.8  MG -- -- 2.0  PHOS -- -- --   No results found for this basename: AST:2,ALT:2,ALKPHOS:2,BILITOT:2,PROT:2,ALBUMIN:2 in the last 72 hours No results found for this basename: LIPASE:2,AMYLASE:2 in the last 72 hours  Basename 04/01/12 0547 03/31/12 0321  WBC 9.9 10.6*  NEUTROABS 7.6 8.3*  HGB 9.8* 10.6*  HCT 29.2* 31.2*  MCV 85.1 83.9  PLT 193 240    Basename 03/31/12 1810  03/31/12 1047 03/31/12 0321  CKTOTAL 61 50 50  CKMB 1.3 1.3 1.0  CKMBINDEX -- -- --  TROPONINI <0.30 <0.30 <0.30   No components found with this basename: POCBNP:3 No results found for this basename: DDIMER:2 in the last 72 hours No results found for this basename: HGBA1C:2 in the last 72 hours No results found for this basename: CHOL:2,HDL:2,LDLCALC:2,TRIG:2,CHOLHDL:2,LDLDIRECT:2 in the last 72 hours  Basename 03/31/12 0321  TSH 0.015*  T4TOTAL --  T3FREE --  THYROIDAB --   No results found for this basename: VITAMINB12:2,FOLATE:2,FERRITIN:2,TIBC:2,IRON:2,RETICCTPCT:2 in the last 72 hours  Micro Results: Recent Results (from the past 240 hour(s))  CULTURE, BLOOD (ROUTINE X 2)     Status: Normal (Preliminary result)   Collection Time   03/28/12  9:20 AM      Component Value Range Status Comment   Specimen Description BLOOD LEFT HAND   Final    Special Requests BOTTLES DRAWN AEROBIC ONLY   Final    Culture  Setup Time [id number]   Final    Culture     Final    Value:        BLOOD CULTURE RECEIVED NO GROWTH TO DATE CULTURE WILL BE HELD FOR 5 DAYS BEFORE ISSUING A FINAL NEGATIVE REPORT   Report Status PENDING   Incomplete   CULTURE,  BLOOD (ROUTINE X 2)     Status: Normal (Preliminary result)   Collection Time   03/28/12  9:20 AM      Component Value Range Status Comment   Specimen Description BLOOD LEFT HAND   Final    Special Requests BOTTLES DRAWN AEROBIC AND ANAEROBIC   Final    Culture  Setup Time [id number]   Final    Culture     Final    Value:        BLOOD CULTURE RECEIVED NO GROWTH TO DATE CULTURE WILL BE HELD FOR 5 DAYS BEFORE ISSUING A FINAL NEGATIVE REPORT   Report Status PENDING   Incomplete   URINE CULTURE     Status: Normal   Collection Time   03/28/12 10:28 AM      Component Value Range Status Comment   Specimen Description URINE, CLEAN CATCH   Final    Special Requests NONE   Final    Culture  Setup Time [id number]   Final    Colony Count  >=100,000 COLONIES/ML   Final    Culture     Final    Value: Multiple bacterial morphotypes present, none predominant. Suggest appropriate recollection if clinically indicated.   Report Status 03/30/2012 FINAL   Final   MRSA PCR SCREENING     Status: Normal   Collection Time   03/30/12  2:29 PM      Component Value Range Status Comment   MRSA by PCR NEGATIVE  NEGATIVE  Final     Studies/Results: Dg Chest 2 View  03/29/2012  *RADIOLOGY REPORT*  Clinical Data: Difficulty breathing.  Fever and chest pain  CHEST - 2 VIEW  Comparison: 03/28/2012  Findings: There is a left chest wall porta-catheter with tip in the SVC.  The heart size appears normal.  Interval development of airspace opacity within the perihilar right lower lobe appears new from previous exam.  Mild diffuse interstitial edema is present.  IMPRESSION:  1.  New right base opacity which may represent pneumonia. 2.  Interstitial edema.  Original Report Authenticated By: Rosealee Albee, M.D.   Dg Chest 2 View  03/28/2012  *RADIOLOGY REPORT*  Clinical Data: Cough, shortness of breath, weakness and fever.  CHEST - 2 VIEW  Comparison: None.  Findings: Trachea is midline.  Left IJ Port-A-Cath tip projects over the SVC.  Heart size within normal limits.  Question very mild interstitial prominence and indistinctness.  No definite pleural fluid. Surgical clips in the right axilla.  IMPRESSION: Mild interstitial prominence may be due to emphysema.  Difficult to exclude an acute process such as mild edema or viral pneumonia.  Original Report Authenticated By: Reyes Ivan, M.D.    Medications: I have reviewed the patient's current medications. Scheduled Meds:    . amiodarone  400 mg Oral TID  . azithromycin  500 mg Intravenous Q24H  . enoxaparin  40 mg Subcutaneous Q24H  . glimepiride  2 mg Oral QAC supper  . insulin aspart  0-15 Units Subcutaneous TID WC  . insulin aspart  0-5 Units Subcutaneous QHS  . insulin glargine  10 Units  Subcutaneous QHS  . letrozole  2.5 mg Oral Daily  . levalbuterol  0.63 mg Nebulization BID  . lisinopril  20 mg Oral Daily  . pantoprazole  40 mg Oral Q1200  . piperacillin-tazobactam (ZOSYN)  IV  3.375 g Intravenous Q8H  . pravastatin  40 mg Oral q1800  . DISCONTD: levalbuterol  0.63 mg Nebulization Q6H  .  DISCONTD: levofloxacin (LEVAQUIN) IV  500 mg Intravenous Q24H   Continuous Infusions:    . DISCONTD: amiodarone (NEXTERONE PREMIX) 360 mg/200 mL dextrose 60 mg/hr (04/01/12 0918)   PRN Meds:.acetaminophen, clonazePAM, ibuprofen, levalbuterol Assessment/Plan: Patient Active Hospital Problem List:  A-fib with RVR (03/30/2012)   Assessment: Patient now converted back to sinus rhythm while on amiodarone drip. Cardiology has recommended that she be converted to oral amiodarone and possibly to a beta blocker if she maintains sinus rhythm.  Low TSH (03/31/2012)   Assessment: The patient has a significantly decreased TSH. Free T3 and free T4 are both pending at this time. Assessment patient may have a component of hyperthyroidism. The patient denies any previous history of thyroid disease.  Community-acquired pneumonia (03/28/2012)   Assessment: The patient's work of breathing much improved today. If the patient continues to do well today we'll transition her over to oral Levaquin starting on tomorrow.   Hyponatremia (03/28/2012)   Assessment: Improved.   Plan:will continue to monitor  Dehydration (03/28/2012)   Assessment: Resolved  Bacteriuria with pyuria (03/28/2012)   Assessment:Cultures negative.  Hypokalemia (03/29/2012)   Assessment: continue repletion and supplementation  Mildly Elevated Troponin (03/30/2012)   Assessment: Subsequent troponins normal. Likely secondary to demand state or atrial stretch.   LOS: 4 days

## 2012-04-01 NOTE — Progress Notes (Signed)
04-01-12 NSG:  Will pt be going home on Lantas insulling or ssi?  She states at home she normally takes PO diabetic medications.  Please address c pt and let RN know so we can provide proper education.

## 2012-04-01 NOTE — Progress Notes (Signed)
[place] CARDS Subjective:  Looking better today. Sitting up. No CP, no SOB. NSR with PAC on tele.    Objective:  Vital Signs in the last 24 hours: Temp:  [98 F (36.7 C)-100.1 F (37.8 C)] 98.3 F (36.8 C) (06/02 0603) Pulse Rate:  [91-101] 91  (06/02 0603) Resp:  [18-28] 18  (06/02 0603) BP: (132-158)/(65-96) 158/96 mmHg (06/02 0603) SpO2:  [91 %-95 %] 91 % (06/02 0603) FiO2 (%):  [30 %] 30 % (06/01 2000) Weight:  [91.4 kg (201 lb 8 oz)] 91.4 kg (201 lb 8 oz) (06/02 0603)  Intake/Output from previous day: 06/01 0701 - 06/02 0700 In: 2159.3 [P.O.:960; I.V.:699.3; IV Piggyback:500] Out: 1401 [Urine:1400; Stool:1]   Physical Exam: General: Well developed, well nourished, in no acute distress, sitting. Head:  Normocephalic and atraumatic. Lungs: Clear to auscultation and percussion. Heart: Normal S1 and S2.  No murmur, rubs or gallops. Port. Pulses: Pulses normal in all 4 extremities. Abdomen: soft, non-tender, positive bowel sounds.Obese Extremities: No clubbing or cyanosis. No edema. Thick extremities Neurologic: Alert and oriented x 3.    Lab Results:  Basename 04/01/12 0547 03/31/12 0321  WBC 9.9 10.6*  HGB 9.8* 10.6*  PLT 193 240    Basename 04/01/12 0547 03/31/12 1810 03/31/12 0321  NA 131* -- 132*  K 3.7 3.4* --  CL 97 -- 95*  CO2 25 -- 26  GLUCOSE 194* -- 161*  BUN 9 -- 13  CREATININE 0.45* -- 0.50    Basename 03/31/12 1810 03/31/12 1047  TROPONINI <0.30 <0.30  Telemetry: As above, NSR Personally viewed.  ECHO: EF 65%  Assessment/Plan:  Active Problems:  Breast cancer  Hyponatremia  Dehydration  Bacteriuria with pyuria  Atrial fibrillation  Pneumonia  71 year old with prior breast cancer with PNA, afib, obesity, hypokalemia.  AFIB  - TSH low. I ordered Free T4 and Free T3. Dr. Ashley Royalty - further work up  - Amiodarone - I will change IV to PO 400mg  TID to continue load. If HR increases, beta blocker may be more helpful than cardizem  (?hyperthyroid)  - Hopefully as illness improves, amiodarone will be short term medication and can be d/c'd soon.   - BNP was 3000- atrial stretch  Hyponatremia  - primary team, stable  Obesity  - encourage weight loss  PNA  - ABX-primary team-improved.   Hypokalemia  - resolved.  Elasha Tess 04/01/2012, 8:20 AM

## 2012-04-02 DIAGNOSIS — I4891 Unspecified atrial fibrillation: Secondary | ICD-10-CM

## 2012-04-02 DIAGNOSIS — D696 Thrombocytopenia, unspecified: Secondary | ICD-10-CM

## 2012-04-02 DIAGNOSIS — E871 Hypo-osmolality and hyponatremia: Secondary | ICD-10-CM

## 2012-04-02 DIAGNOSIS — R946 Abnormal results of thyroid function studies: Secondary | ICD-10-CM

## 2012-04-02 LAB — GLUCOSE, CAPILLARY: Glucose-Capillary: 242 mg/dL — ABNORMAL HIGH (ref 70–99)

## 2012-04-02 LAB — BASIC METABOLIC PANEL
BUN: 8 mg/dL (ref 6–23)
CO2: 27 mEq/L (ref 19–32)
Calcium: 8.6 mg/dL (ref 8.4–10.5)
Creatinine, Ser: 0.45 mg/dL — ABNORMAL LOW (ref 0.50–1.10)

## 2012-04-02 LAB — MAGNESIUM: Magnesium: 2 mg/dL (ref 1.5–2.5)

## 2012-04-02 LAB — PROTIME-INR
INR: 1.12 (ref 0.00–1.49)
Prothrombin Time: 14.6 seconds (ref 11.6–15.2)

## 2012-04-02 MED ORDER — WARFARIN - PHARMACIST DOSING INPATIENT: Freq: Every day | Status: DC

## 2012-04-02 MED ORDER — POTASSIUM CHLORIDE CRYS ER 20 MEQ PO TBCR
40.0000 meq | EXTENDED_RELEASE_TABLET | Freq: Every day | ORAL | Status: DC
  Administered 2012-04-03: 40 meq via ORAL
  Filled 2012-04-02: qty 2

## 2012-04-02 MED ORDER — POTASSIUM CHLORIDE CRYS ER 20 MEQ PO TBCR
40.0000 meq | EXTENDED_RELEASE_TABLET | ORAL | Status: AC
  Administered 2012-04-02 (×2): 40 meq via ORAL
  Filled 2012-04-02 (×2): qty 2

## 2012-04-02 MED ORDER — SODIUM CHLORIDE 0.9 % IJ SOLN
10.0000 mL | INTRAMUSCULAR | Status: DC | PRN
  Administered 2012-04-03: 10 mL

## 2012-04-02 MED ORDER — COUMADIN BOOK
Freq: Once | Status: AC
  Administered 2012-04-02: 15:00:00
  Filled 2012-04-02: qty 1

## 2012-04-02 MED ORDER — METOPROLOL TARTRATE 12.5 MG HALF TABLET
12.5000 mg | ORAL_TABLET | Freq: Two times a day (BID) | ORAL | Status: DC
  Administered 2012-04-02 (×2): 12.5 mg via ORAL
  Filled 2012-04-02 (×4): qty 1

## 2012-04-02 MED ORDER — WARFARIN VIDEO: Freq: Once | Status: DC

## 2012-04-02 MED ORDER — LEVOFLOXACIN 750 MG PO TABS
750.0000 mg | ORAL_TABLET | ORAL | Status: DC
  Administered 2012-04-02 – 2012-04-03 (×2): 750 mg via ORAL
  Filled 2012-04-02 (×2): qty 1

## 2012-04-02 MED ORDER — WARFARIN SODIUM 7.5 MG PO TABS
7.5000 mg | ORAL_TABLET | Freq: Once | ORAL | Status: AC
  Administered 2012-04-02: 7.5 mg via ORAL
  Filled 2012-04-02: qty 1

## 2012-04-02 NOTE — Progress Notes (Signed)
Subjective: Patient looks well and states that she feels very well today.  Interval history: Free T3 and T4 noted to be normal.   Objective: Filed Vitals:   04/01/12 1330 04/01/12 2153 04/02/12 0413 04/02/12 0812  BP: 123/76 133/77 143/81 143/83  Pulse: 74 78 83 78  Temp: 98.3 F (36.8 C) 98.2 F (36.8 C) 98.3 F (36.8 C) 98 F (36.7 C)  TempSrc: Oral Oral Oral Oral  Resp: 18 18 18 19   Height:      Weight:   91.763 kg (202 lb 4.8 oz)   SpO2: 94% 95% 94% 94%   Weight change: 0.363 kg (12.8 oz)  Intake/Output Summary (Last 24 hours) at 04/02/12 1010 Last data filed at 04/02/12 0818  Gross per 24 hour  Intake 2163.1 ml  Output   2400 ml  Net -236.9 ml    General: Alert, awake, oriented x3, in no acute distress. HEENT: [place]/AT PEERL, EOMI Neck: Trachea midline,  no masses, no thyromegal,y no JVD, no carotid bruit OROPHARYNX: Mucosa moist. No exudate/ erythema/lesions.  Heart: S1-S2 normal, no murmurs rubs or gallops noted. PMI nondisplaced. Lungs: Clear to auscultation, no wheezing or rhonchi noted. No increased vocal fremitus resonant to percussion  Abdomen: Soft, nontender, nondistended, positive bowel sounds, no masses no hepatosplenomegaly noted..  Neuro: No focal neurological deficits noted cranial nerves II through XII grossly intact. DTRs 2+ bilaterally upper and lower extremities.  Lab Results:  Basename 04/02/12 0645 04/01/12 0547 03/31/12 0321  NA 135 131* --  K 3.3* 3.7 --  CL 100 97 --  CO2 27 25 --  GLUCOSE 137* 194* --  BUN 8 9 --  CREATININE 0.45* 0.45* --  CALCIUM 8.6 8.3* --  MG -- -- 2.0  PHOS -- -- --   No results found for this basename: AST:2,ALT:2,ALKPHOS:2,BILITOT:2,PROT:2,ALBUMIN:2 in the last 72 hours No results found for this basename: LIPASE:2,AMYLASE:2 in the last 72 hours  Basename 04/01/12 0547 03/31/12 0321  WBC 9.9 10.6*  NEUTROABS 7.6 8.3*  HGB 9.8* 10.6*  HCT 29.2* 31.2*  MCV 85.1 83.9  PLT 193 240    Basename 03/31/12 1810  03/31/12 1047 03/31/12 0321  CKTOTAL 61 50 50  CKMB 1.3 1.3 1.0  CKMBINDEX -- -- --  TROPONINI <0.30 <0.30 <0.30   No components found with this basename: POCBNP:3 No results found for this basename: DDIMER:2 in the last 72 hours No results found for this basename: HGBA1C:2 in the last 72 hours No results found for this basename: CHOL:2,HDL:2,LDLCALC:2,TRIG:2,CHOLHDL:2,LDLDIRECT:2 in the last 72 hours  Basename 04/01/12 0900 03/31/12 0321  TSH -- 0.015*  T4TOTAL -- --  T3FREE 2.3 --  THYROIDAB -- --   No results found for this basename: VITAMINB12:2,FOLATE:2,FERRITIN:2,TIBC:2,IRON:2,RETICCTPCT:2 in the last 72 hours  Micro Results: Recent Results (from the past 240 hour(s))  CULTURE, BLOOD (ROUTINE X 2)     Status: Normal (Preliminary result)   Collection Time   03/28/12  9:20 AM      Component Value Range Status Comment   Specimen Description BLOOD LEFT HAND   Final    Special Requests BOTTLES DRAWN AEROBIC ONLY   Final    Culture  Setup Time [id number]   Final    Culture     Final    Value:        BLOOD CULTURE RECEIVED NO GROWTH TO DATE CULTURE WILL BE HELD FOR 5 DAYS BEFORE ISSUING A FINAL NEGATIVE REPORT   Report Status PENDING   Incomplete   CULTURE,  BLOOD (ROUTINE X 2)     Status: Normal (Preliminary result)   Collection Time   03/28/12  9:20 AM      Component Value Range Status Comment   Specimen Description BLOOD LEFT HAND   Final    Special Requests BOTTLES DRAWN AEROBIC AND ANAEROBIC   Final    Culture  Setup Time [id number]   Final    Culture     Final    Value:        BLOOD CULTURE RECEIVED NO GROWTH TO DATE CULTURE WILL BE HELD FOR 5 DAYS BEFORE ISSUING A FINAL NEGATIVE REPORT   Report Status PENDING   Incomplete   URINE CULTURE     Status: Normal   Collection Time   03/28/12 10:28 AM      Component Value Range Status Comment   Specimen Description URINE, CLEAN CATCH   Final    Special Requests NONE   Final    Culture  Setup Time [id number]    Final    Colony Count >=100,000 COLONIES/ML   Final    Culture     Final    Value: Multiple bacterial morphotypes present, none predominant. Suggest appropriate recollection if clinically indicated.   Report Status 03/30/2012 FINAL   Final   MRSA PCR SCREENING     Status: Normal   Collection Time   03/30/12  2:29 PM      Component Value Range Status Comment   MRSA by PCR NEGATIVE  NEGATIVE  Final     Studies/Results: Dg Chest 2 View  03/29/2012  *RADIOLOGY REPORT*  Clinical Data: Difficulty breathing.  Fever and chest pain  CHEST - 2 VIEW  Comparison: 03/28/2012  Findings: There is a left chest wall porta-catheter with tip in the SVC.  The heart size appears normal.  Interval development of airspace opacity within the perihilar right lower lobe appears new from previous exam.  Mild diffuse interstitial edema is present.  IMPRESSION:  1.  New right base opacity which may represent pneumonia. 2.  Interstitial edema.  Original Report Authenticated By: Rosealee Albee, M.D.   Dg Chest 2 View  03/28/2012  *RADIOLOGY REPORT*  Clinical Data: Cough, shortness of breath, weakness and fever.  CHEST - 2 VIEW  Comparison: None.  Findings: Trachea is midline.  Left IJ Port-A-Cath tip projects over the SVC.  Heart size within normal limits.  Question very mild interstitial prominence and indistinctness.  No definite pleural fluid. Surgical clips in the right axilla.  IMPRESSION: Mild interstitial prominence may be due to emphysema.  Difficult to exclude an acute process such as mild edema or viral pneumonia.  Original Report Authenticated By: Reyes Ivan, M.D.    Medications: I have reviewed the patient's current medications. Scheduled Meds:    . amiodarone  400 mg Oral TID  . enoxaparin  40 mg Subcutaneous Q24H  . glimepiride  2 mg Oral QAC supper  . insulin aspart  0-15 Units Subcutaneous TID WC  . insulin aspart  0-5 Units Subcutaneous QHS  . insulin glargine  10 Units Subcutaneous QHS  .  letrozole  2.5 mg Oral Daily  . levalbuterol  0.63 mg Nebulization BID  . levofloxacin  750 mg Oral Daily  . lisinopril  20 mg Oral Daily  . pantoprazole  40 mg Oral Q1200  . potassium chloride  40 mEq Oral Q2H  . potassium chloride  40 mEq Oral Daily  . pravastatin  40 mg Oral q1800  .  DISCONTD: azithromycin  500 mg Intravenous Q24H  . DISCONTD: piperacillin-tazobactam (ZOSYN)  IV  3.375 g Intravenous Q8H   Continuous Infusions:    . DISCONTD: amiodarone (NEXTERONE PREMIX) 360 mg/200 mL dextrose 60 mg/hr (04/01/12 0918)   PRN Meds:.acetaminophen, clonazePAM, ibuprofen, levalbuterol Assessment/Plan: Patient Active Hospital Problem List:  A-fib with RVR (03/30/2012)   Assessment: Patient now converted back to sinus rhythm while on amiodarone drip. Cardiology has recommended that she be converted to oral amiodarone and possibly to a beta blocker if she maintains sinus rhythm.  Low TSH (03/31/2012)   Assessment: Free T4 and T3 normal. This is likely sick euthyroid. Recommend rechecking when patient in convalescent state.  Community-acquired pneumonia (03/28/2012)   Assessment: The patient's work of breathing much improved today. Will change to Levaquin to complete a total of 8 days total antibiotic therapy.  Hyponatremia (03/28/2012)   Assessment: resolved   Plan:will continue to monitor  Dehydration (03/28/2012)   Assessment: Resolved  Bacteriuria with pyuria (03/28/2012)   Assessment:Cultures negative.  Hypokalemia (03/29/2012)   Assessment: continue repletion and supplementation  Mildly Elevated Troponin (03/30/2012)   Assessment: Subsequent troponins normal. Likely secondary to demand state or atrial stretch.   LOS: 5 days

## 2012-04-02 NOTE — Progress Notes (Signed)
    Subjective:  No chest pain or dyspnea. The patient is feeling a lot better.  Objective:  Vital Signs in the last 24 hours: Temp:  [98 F (36.7 C)-98.8 F (37.1 C)] 98.8 F (37.1 C) (06/03 1331) Pulse Rate:  [78-83] 82  (06/03 1331) Resp:  [18-19] 18  (06/03 1331) BP: (129-143)/(77-83) 129/79 mmHg (06/03 1331) SpO2:  [94 %-96 %] 96 % (06/03 1331) Weight:  [91.763 kg (202 lb 4.8 oz)] 91.763 kg (202 lb 4.8 oz) (06/03 0413)  Intake/Output from previous day: 06/02 0701 - 06/03 0700 In: 2043.1 [P.O.:1560; I.V.:233.1; IV Piggyback:250] Out: 2400 [Urine:2400]  Physical Exam: Pt is alert and oriented, NAD HEENT: normal Neck: JVP - normal, carotids 2+= without bruits Lungs: CTA bilaterally CV: RRR without murmur or gallop Abd: soft, NT, Positive BS, obese Ext: no C/C/E, distal pulses intact and equal Skin: warm/dry no rash   Lab Results:  Basename 04/01/12 0547 03/31/12 0321  WBC 9.9 10.6*  HGB 9.8* 10.6*  PLT 193 240    Basename 04/02/12 0645 04/01/12 0547  NA 135 131*  K 3.3* 3.7  CL 100 97  CO2 27 25  GLUCOSE 137* 194*  BUN 8 9  CREATININE 0.45* 0.45*    Basename 03/31/12 1810 03/31/12 1047  TROPONINI <0.30 <0.30   Tele: Sinus rhythm  Assessment/Plan:  Atrial fibrillation. The patient is on oral amiodarone. I think this should be limited to the short-term, especially considering her thyroid disease. The patient has multiple risk factors for thromboembolism and should be started on warfarin as long as there is no contraindication. Would start metoprolol 25 mg twice daily. Also will check an EKG in the morning considering the potential for QT prolongation with concomitant amiodarone and levofloxacin. Will continue to follow with you.  Tonny Bollman, M.D. 04/02/2012, 1:41 PM

## 2012-04-02 NOTE — Progress Notes (Addendum)
ANTICOAGULATION CONSULT NOTE - Initial Consult  Pharmacy Consult for warfarin Indication: atrial fibrillation  No Known Allergies  Patient Measurements: Height: 5\' 3"  (160 cm) Weight: 202 lb 4.8 oz (91.763 kg) IBW/kg (Calculated) : 52.4   Vital Signs: Temp: 98.8 F (37.1 C) (06/03 1331) Temp src: Oral (06/03 1331) BP: 129/79 mmHg (06/03 1331) Pulse Rate: 82  (06/03 1331)  Labs:  Basename 04/02/12 0645 04/01/12 0547 03/31/12 1810 03/31/12 1047 03/31/12 0321  HGB -- 9.8* -- -- 10.6*  HCT -- 29.2* -- -- 31.2*  PLT -- 193 -- -- 240  APTT -- -- -- -- --  LABPROT -- -- -- -- --  INR -- -- -- -- --  HEPARINUNFRC -- -- -- -- --  CREATININE 0.45* 0.45* -- -- 0.50  CKTOTAL -- -- 61 50 50  CKMB -- -- 1.3 1.3 1.0  TROPONINI -- -- <0.30 <0.30 <0.30    Estimated Creatinine Clearance: 72.5 ml/min (by C-G formula based on Cr of 0.45).   Medical History: Past Medical History  Diagnosis Date  . Cancer   . Diabetes mellitus   . Hypertension   . Depression   . Syncopal episodes   . Arthritis   . High cholesterol   . Blood in stool     occasional  . Chicken pox   . Fainting     Medications:  Scheduled:    . amiodarone  400 mg Oral TID  . enoxaparin  40 mg Subcutaneous Q24H  . glimepiride  2 mg Oral QAC supper  . insulin aspart  0-15 Units Subcutaneous TID WC  . insulin aspart  0-5 Units Subcutaneous QHS  . insulin glargine  10 Units Subcutaneous QHS  . letrozole  2.5 mg Oral Daily  . levalbuterol  0.63 mg Nebulization BID  . levofloxacin  750 mg Oral Q24H  . lisinopril  20 mg Oral Daily  . metoprolol tartrate  12.5 mg Oral BID  . pantoprazole  40 mg Oral Q1200  . potassium chloride  40 mEq Oral Q2H  . potassium chloride  40 mEq Oral Daily  . pravastatin  40 mg Oral q1800  . DISCONTD: azithromycin  500 mg Intravenous Q24H  . DISCONTD: piperacillin-tazobactam (ZOSYN)  IV  3.375 g Intravenous Q8H   Infusions:    Assessment:  71 yo female with no onset Afib on  amiodarone currently to begin warfarin per pharmacy dosing per cardiology orders  No current baseline INR  Patient also currently on Lovenox 40mg  q24  Goal of Therapy:  INR 2-3    Plan:  1)Coumadin 7.5mg  po x 1 today 2) Daily PT/INR 3) D/C Lovenox when INR > 2   Hessie Knows, PharmD, BCPS Pager [phone] 04/02/2012 2:13 PM

## 2012-04-02 NOTE — Progress Notes (Addendum)
Inpatient Diabetes Program Recommendations  AACE/ADA: New Consensus Statement on Inpatient Glycemic Control (2009)  Target Ranges:  Prepandial:   less than 140 mg/dL      Peak postprandial:   less than 180 mg/dL (1-2 hours)      Critically ill patients:  140 - 180 mg/dL   Reason for Visit: Note patient's questions regarding home diabetes medication regimen.  Consider checking A1C to determine pre-hospitalization glycemic control.  Will talk with patient regarding home diabetes regimen.  Addendum 1430:  Briefly spoke to patient regarding home diabetes control.  She is unsure of last A1c and states she has "been out sorts since her son died in 01-08-23".  She states that she spoke with Dr. Ashley Royalty and the plan is for her to go home on her pills and not insulin.  Stressed importance of follow-up with PCP.

## 2012-04-02 NOTE — Progress Notes (Signed)
Coumadin book given to pt, Coumadin video watched and teaching provided. Julio Sicks RN

## 2012-04-03 DIAGNOSIS — R946 Abnormal results of thyroid function studies: Secondary | ICD-10-CM

## 2012-04-03 DIAGNOSIS — I4891 Unspecified atrial fibrillation: Secondary | ICD-10-CM

## 2012-04-03 DIAGNOSIS — D696 Thrombocytopenia, unspecified: Secondary | ICD-10-CM

## 2012-04-03 DIAGNOSIS — E871 Hypo-osmolality and hyponatremia: Secondary | ICD-10-CM

## 2012-04-03 LAB — CULTURE, BLOOD (ROUTINE X 2)
Culture  Setup Time: 201305291334
Culture: NO GROWTH

## 2012-04-03 LAB — PROTIME-INR: Prothrombin Time: 14.1 seconds (ref 11.6–15.2)

## 2012-04-03 LAB — GLUCOSE, CAPILLARY: Glucose-Capillary: 133 mg/dL — ABNORMAL HIGH (ref 70–99)

## 2012-04-03 MED ORDER — POTASSIUM CHLORIDE CRYS ER 20 MEQ PO TBCR: 40.0000 meq | EXTENDED_RELEASE_TABLET | Freq: Every day | ORAL | Status: DC

## 2012-04-03 MED ORDER — ASPIRIN EC 325 MG PO TBEC
325.0000 mg | DELAYED_RELEASE_TABLET | Freq: Every day | ORAL | Status: DC
  Administered 2012-04-03: 325 mg via ORAL
  Filled 2012-04-03: qty 4

## 2012-04-03 MED ORDER — METOPROLOL TARTRATE 25 MG PO TABS: 25.0000 mg | ORAL_TABLET | Freq: Two times a day (BID) | ORAL | Status: DC

## 2012-04-03 MED ORDER — HEPARIN SOD (PORK) LOCK FLUSH 100 UNIT/ML IV SOLN
500.0000 [IU] | INTRAVENOUS | Status: AC | PRN
  Administered 2012-04-03: 500 [IU]

## 2012-04-03 MED ORDER — LISINOPRIL 20 MG PO TABS: 20.0000 mg | ORAL_TABLET | Freq: Every day | ORAL | Status: DC

## 2012-04-03 MED ORDER — ASPIRIN 325 MG PO TBEC: 325.0000 mg | DELAYED_RELEASE_TABLET | Freq: Every day | ORAL | Status: AC

## 2012-04-03 MED ORDER — METOPROLOL TARTRATE 25 MG PO TABS
25.0000 mg | ORAL_TABLET | Freq: Two times a day (BID) | ORAL | Status: DC
  Administered 2012-04-03: 25 mg via ORAL
  Filled 2012-04-03 (×2): qty 1

## 2012-04-03 NOTE — Progress Notes (Addendum)
    Subjective:  Continues to feel better. No chest pain or dyspnea. No palpitations.  Objective:  Vital Signs in the last 24 hours: Temp:  [97.8 F (36.6 C)-98.8 F (37.1 C)] 97.8 F (36.6 C) (06/04 0515) Pulse Rate:  [76-83] 83  (06/04 0515) Resp:  [18-19] 18  (06/04 0515) BP: (124-152)/(71-83) 152/78 mmHg (06/04 0515) SpO2:  [94 %-96 %] 96 % (06/04 0515) Weight:  [92.307 kg (203 lb 8 oz)] 92.307 kg (203 lb 8 oz) (06/04 0515)  Intake/Output from previous day: 06/03 0701 - 06/04 0700 In: 790 [P.O.:790] Out: 2550 [Urine:2550]  Physical Exam: Pt is alert and oriented, NAD HEENT: normal Neck: JVP - normal, carotids 2+= without bruits Lungs: CTA bilaterally CV: RRR without murmur or gallop Abd: soft, NT, Positive BS, no hepatomegaly Ext: no C/C/E, distal pulses intact and equal Skin: warm/dry no rash   Lab Results:  [facility] 04/01/12 0547  WBC 9.9  HGB 9.8*  PLT 193    Basename 04/02/12 0645 04/01/12 0547  NA 135 131*  K 3.3* 3.7  CL 100 97  CO2 27 25  GLUCOSE 137* 194*  BUN 8 9  CREATININE 0.45* 0.45*    Basename 03/31/12 1810 03/31/12 1047  TROPONINI <0.30 <0.30   Tele: sinus rhythm without arrhythmia  Assessment/Plan:  1. Atrial fibrillation with RVR, converted to sinus rhythm 2. Community-acquired pneumonia 3. Hyponatremia - resolved 4. Normal LV function  Reviewed case with Dr Ashley Royalty and agree that it may be best to avoid oral anticoagulation in this patient who has a strong desire not to take these medications. She has had a single episode of AF in the setting of pneumonia and is maintaining sinus rhythm. I would recommend discontinuing amiodarone and discharging her on metoprolol 25 mg BID. She should be on ASA 325 mg daily. She is appropriately on an ACE and statin. Will arrange 2 week follow-up with the PA in our office. If recurrence of AF anticoag will need to be started as she has HTN and DM. EKG needs to be checked this am to rule out  prononged QT in setting of amiodarone and quinolone.  Please call if any questions, thx.  Tonny Bollman, M.D. 04/03/2012, 6:56 AM

## 2012-04-03 NOTE — Discharge Summary (Signed)
Darlene Harrell MRN: [id number] DOB/AGE: [birth year] 71 y.o.  Admit date: 03/28/2012 Discharge date: 04/03/2012  Primary Care Physician:  Neena Rhymes, MD, MD   Discharge Diagnoses:   Patient Active Problem List  Diagnoses  . HTN (hypertension)  . Hyperlipidemia  . DM (diabetes mellitus)  . Adjustment disorder with mixed anxiety and depressed mood  . Breast cancer  . Hyponatremia  . Dehydration  . Bacteriuria with pyuria  . Atrial fibrillation  . Pneumonia    DISCHARGE MEDICATION: Medication List  As of 04/03/2012  9:44 AM   STOP taking these medications         lisinopril-hydrochlorothiazide 20-12.5 MG per tablet         TAKE these medications         aspirin 325 MG EC tablet   Take 1 tablet (325 mg total) by mouth daily.      clonazePAM 0.5 MG tablet   Commonly known as: KLONOPIN   Take 1 tablet (0.5 mg total) by mouth at bedtime as needed.      glimepiride 2 MG tablet   Commonly known as: AMARYL   Take 1 tablet (2 mg total) by mouth at bedtime.      letrozole 2.5 MG tablet   Commonly known as: FEMARA   Take 2.5 mg by mouth daily.      lisinopril 20 MG tablet   Commonly known as: PRINIVIL,ZESTRIL   Take 1 tablet (20 mg total) by mouth daily.      metFORMIN 500 MG tablet   Commonly known as: GLUCOPHAGE   Take 1 tablet (500 mg total) by mouth 2 (two) times daily with a meal.      metoprolol tartrate 25 MG tablet   Commonly known as: LOPRESSOR   Take 1 tablet (25 mg total) by mouth 2 (two) times daily.      potassium chloride SA 20 MEQ tablet   Commonly known as: K-DUR,KLOR-CON   Take 2 tablets (40 mEq total) by mouth daily.      pravastatin 40 MG tablet   Commonly known as: PRAVACHOL   Take 1 tablet (40 mg total) by mouth daily.              Consults: Treatment Team:  Rounding Lbcardiology, MD   SIGNIFICANT DIAGNOSTIC STUDIES:  Dg Chest 2 View  03/29/2012  *RADIOLOGY REPORT*  Clinical Data: Difficulty breathing.  Fever and chest pain   CHEST - 2 VIEW  Comparison: 03/28/2012  Findings: There is a left chest wall porta-catheter with tip in the SVC.  The heart size appears normal.  Interval development of airspace opacity within the perihilar right lower lobe appears new from previous exam.  Mild diffuse interstitial edema is present.  IMPRESSION:  1.  New right base opacity which may represent pneumonia. 2.  Interstitial edema.  Original Report Authenticated By: Rosealee Albee, M.D.   Dg Chest 2 View  03/28/2012  *RADIOLOGY REPORT*  Clinical Data: Cough, shortness of breath, weakness and fever.  CHEST - 2 VIEW  Comparison: None.  Findings: Trachea is midline.  Left IJ Port-A-Cath tip projects over the SVC.  Heart size within normal limits.  Question very mild interstitial prominence and indistinctness.  No definite pleural fluid. Surgical clips in the right axilla.  IMPRESSION: Mild interstitial prominence may be due to emphysema.  Difficult to exclude an acute process such as mild edema or viral pneumonia.  Original Report Authenticated By: Reyes Ivan, M.D.     ECHO:  Left ventricle: The cavity size was normal. Wall thickness was increased in a pattern of mild LVH. Systolic function was normal. The estimated ejection fraction was in the range of 60% to 65%. Wall motion was normal; there were no regional wall motion abnormalities. The study is not technically sufficient to allow evaluation of LV diastolic function. - Mitral valve: Mild regurgitation. - Left atrium: The atrium was mildly dilated   Recent Results (from the past 240 hour(s))  CULTURE, BLOOD (ROUTINE X 2)     Status: Normal   Collection Time   03/28/12  9:20 AM      Component Value Range Status Comment   Specimen Description BLOOD LEFT HAND   Final    Special Requests BOTTLES DRAWN AEROBIC ONLY   Final    Culture  Setup Time [id number]   Final    Culture NO GROWTH 5 DAYS   Final    Report Status 04/03/2012 FINAL   Final   CULTURE, BLOOD  (ROUTINE X 2)     Status: Normal   Collection Time   03/28/12  9:20 AM      Component Value Range Status Comment   Specimen Description BLOOD LEFT HAND   Final    Special Requests BOTTLES DRAWN AEROBIC AND ANAEROBIC   Final    Culture  Setup Time [id number]   Final    Culture NO GROWTH 5 DAYS   Final    Report Status 04/03/2012 FINAL   Final   URINE CULTURE     Status: Normal   Collection Time   03/28/12 10:28 AM      Component Value Range Status Comment   Specimen Description URINE, CLEAN CATCH   Final    Special Requests NONE   Final    Culture  Setup Time [id number]   Final    Colony Count >=100,000 COLONIES/ML   Final    Culture     Final    Value: Multiple bacterial morphotypes present, none predominant. Suggest appropriate recollection if clinically indicated.   Report Status 03/30/2012 FINAL   Final   MRSA PCR SCREENING     Status: Normal   Collection Time   03/30/12  2:29 PM      Component Value Range Status Comment   MRSA by PCR NEGATIVE  NEGATIVE  Final     BRIEF ADMITTING H & P: Patient is a 71 year old female who presents to the emergency room with a three-day history of tactile fever, general myalgias over her entire body, chills, generalized weakness and nonproductive cough. The patient states that she has had decreased oral intake and had one episode of vomiting. She denies any neck stiffness, photophobia but does admit to an intermittent headache. She denied and chest pain, LOC or dizziness.   Hospital Course:  Present on Admission: .Atypical pneumonia: Patient presented to the hospital with clinical signs and symptoms of pneumonia. Radiologic studies showed what appeared to be an atypical pneumonia. However after the patient was hydrated a chest x-ray showed findings consistent with a bacterial pneumonia. The patient was treated with antibiotics for broad-spectrum coverage. She defervesced during her hospitalization and has completed 7 days of antibiotics. Eyes  she has completed her course on today. The she will not be discharged on any antibiotics.  . Atrial fibrillation with rapid ventricular response: During the course of her hospitalization the patient went into atrial fibrillation with rapid ventricular response. She was treated initially with Cardizem drip and had very poor response. She  was then changed to amiodarone drip. She was weaned off of the drip and she converted to sinus rhythm. She was seen by cardiology who recommended the patient should be discharged on metoprolol 25 mg by mouth twice a day. There was some discussion as to whether or not the patient should be on anticoagulation. The patient is very resistant to any anticoagulation at this time. Cardiologist had a lengthy discussion with the patient regarding her risk of stroke off an anticoagulant and the decision has been made by the patient to defer any anticoagulation at this time. However I instructed the patient if she has any episodes of palpitation that she should inform her primary care physician immediately so that she can be evaluated for her rhythm and further decisions about anticoagulation be maybe she has a recurrent episode of atrial fibrillation.   .Abnormal Thyroid Function Tests: Patient was found to have a TSH significantly low at 0.015. Free T3 and free T4 were evaluated and found to be within normal range. I suspect this to be sick euthyroid. However it would be advantageous for a thyroid panel to be repeated when the patient is in a convalescent state.   .Hyponatremia: The patient was hyponatremic as well as hypokalemic with a sodium of 129 and a potassium of 3.2 on admission. It was felt this was secondary to the hydrochlorothiazide. This was discontinued and the patient was given IV fluids for rehydration. At this time the patient's sodium levels are normal. The hydrochlorothiazide has been discontinued as a component of her blood pressure regimen.   . Hypokalemia: The  patient was hypokalemic here in the hospital and was repleted by IV and orally. At the time of discharge she has a normal potassium level. She is being discharged on potassium supplementation. I would recommend the patient has a repeat of her electrolytes in approximately one to 2 weeks to evaluate for the need for ongoing supplementation.   . Elevated BNP: The patient had an elevated BNP however this was felt to be secondary to atrial stretch as her 2-D echocardiogram showed normal left ventricular function.   . Elevated troponin: The patient had an elevated troponin at the time that she limits her rapid ventricular response. Her subsequent troponins were normal and this was felt to be secondary to demand versus atrial stretch.   .Dehydration: The patient was clinically dehydrated on arrival. Was felt to be multifactorial owing to poor oral intake, diuretics, and acute infectious process. The patient was given IV hydration and at the time of discharge she is fully hydrated and maintaining her hydration without artificial means.   .Bacteriuria with pyuria: Patient had a urinalysis which showed bacteriuria with pyuria. However she had no clinical symptoms consistent with urinary tract infection. Urine culture showed multiple bacterial morphotypes and this was dismissed as a urinary tract infection. However the patient had been on antibiotics which would cover pathogens NA community acquired urinary tract infection.  Condition at time of discharge stable.  Disposition and Follow-up:  Patient is to follow up with her primary care physician Dr. Beverely Low in one week. Discharge Orders    Future Appointments: Provider: Department: Dept Phone: Center:   04/10/2012 11:15 AM [facility] Financial Counselor [facility] [phone] None   04/10/2012 11:30 AM Rachael Fee [facility] [phone] None   04/10/2012 12:00 PM Josph Macho, MD [facility] [phone] None   04/10/2012 1:00 PM [facility] Inj Nurse  [facility] [phone] None      DISCHARGE EXAM:  General: Alert, awake, oriented x3, in no acute distress.  Vital Signs:Blood pressure 134/77, pulse 71, temperature 98 F (36.7 C), temperature source Oral, resp. rate 19, height 5\' 3"  (1.6 m), weight 92.307 kg (203 lb 8 oz), SpO2 96.00%. HEENT: [place]/AT PEERL, EOMI  Neck: Trachea midline, no masses, no thyromegal,y no JVD, no carotid bruit  OROPHARYNX: Mucosa moist. No exudate/ erythema/lesions.  Heart: S1-S2 normal, no murmurs rubs or gallops noted. PMI nondisplaced.  Lungs: Clear to auscultation, no wheezing or rhonchi noted. No increased vocal fremitus resonant to percussion  Abdomen: Soft, nontender, nondistended, positive bowel sounds, no masses no hepatosplenomegaly noted..  Neuro: No focal neurological deficits noted cranial nerves II through XII grossly intact. DTRs 2+ bilaterally upper and lower extremities.      Basename 04/02/12 1030 04/02/12 0645 04/01/12 0547  NA -- 135 131*  K -- 3.3* 3.7  CL -- 100 97  CO2 -- 27 25  GLUCOSE -- 137* 194*  BUN -- 8 9  CREATININE -- 0.45* 0.45*  CALCIUM -- 8.6 8.3*  MG 2.0 -- --  PHOS -- -- --   No results found for this basename: AST:2,ALT:2,ALKPHOS:2,BILITOT:2,PROT:2,ALBUMIN:2 in the last 72 hours No results found for this basename: LIPASE:2,AMYLASE:2 in the last 72 hours  Basename 04/01/12 0547  WBC 9.9  NEUTROABS 7.6  HGB 9.8*  HCT 29.2*  MCV 85.1  PLT 193   Total time for discharge process including face-to-face time approximately 35 minutes Signed: Kailen Name A. 04/03/2012, 9:44 AM

## 2012-04-03 NOTE — Telephone Encounter (Signed)
Spoke to referral coordinator to update if any records have been noted per no records noted on MD Tabori desk, was advised by referral rept aht the pt has an apt set up for next Tuesday for the referral in question per records must have came to the referral apt office, noted [facility] office apt 04-10-12 flush on the same day.

## 2012-04-10 ENCOUNTER — Other Ambulatory Visit: Payer: Self-pay | Admitting: *Deleted

## 2012-04-10 ENCOUNTER — Ambulatory Visit: Payer: Medicare Other

## 2012-04-10 ENCOUNTER — Other Ambulatory Visit (HOSPITAL_BASED_OUTPATIENT_CLINIC_OR_DEPARTMENT_OTHER): Payer: Medicare Other | Admitting: Lab

## 2012-04-10 ENCOUNTER — Ambulatory Visit (HOSPITAL_BASED_OUTPATIENT_CLINIC_OR_DEPARTMENT_OTHER): Payer: Medicare Other | Admitting: Hematology & Oncology

## 2012-04-10 VITALS — BP 152/75 | HR 81 | Temp 97.1°F | Ht 63.0 in | Wt 201.0 lb

## 2012-04-10 DIAGNOSIS — C773 Secondary and unspecified malignant neoplasm of axilla and upper limb lymph nodes: Secondary | ICD-10-CM | POA: Diagnosis not present

## 2012-04-10 DIAGNOSIS — Z9010 Acquired absence of unspecified breast and nipple: Secondary | ICD-10-CM | POA: Diagnosis not present

## 2012-04-10 DIAGNOSIS — C50919 Malignant neoplasm of unspecified site of unspecified female breast: Secondary | ICD-10-CM

## 2012-04-10 DIAGNOSIS — M810 Age-related osteoporosis without current pathological fracture: Secondary | ICD-10-CM

## 2012-04-10 DIAGNOSIS — D689 Coagulation defect, unspecified: Secondary | ICD-10-CM | POA: Diagnosis not present

## 2012-04-10 DIAGNOSIS — Z170 Estrogen receptor positive status [ER+]: Secondary | ICD-10-CM

## 2012-04-10 LAB — CBC WITH DIFFERENTIAL (CANCER CENTER ONLY)
BASO#: 0 10*3/uL (ref 0.0–0.2)
BASO%: 0.2 % (ref 0.0–2.0)
EOS%: 2.8 % (ref 0.0–7.0)
HGB: 11.8 g/dL (ref 11.6–15.9)
LYMPH#: 1.2 10*3/uL (ref 0.9–3.3)
MCHC: 33 g/dL (ref 32.0–36.0)
NEUT#: 6.2 10*3/uL (ref 1.5–6.5)
WBC: 8.5 10*3/uL (ref 3.9–10.0)

## 2012-04-10 NOTE — Telephone Encounter (Signed)
Spoke to pt son and was advised the pharmacy needs to be changed to [facility] on [address], pt son noted he is currently at [facility] on [address] and will advise them to transfer the RX over to be filled, he will call my personal extension if he needs further assistance, changed pharmacy in the chart for the pt.

## 2012-04-10 NOTE — Progress Notes (Signed)
CC:   Neena Rhymes, M.D.  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  HISTORY OF PRESENT ILLNESS:  Ms. Mousseau is a very charming 71 year old white female from [place].  She moved down here back in February.  She was diagnosed with stage IIIC ductal carcinoma of the right breast I think back in 2011.  She had a 5.8 cm tumor.  She underwent mastectomy. She had 11 positive lymph nodes.  Her tumor was ER positive, PR positive and HER2 negative.  She was treated up in [place] with adria/Cytoxan and followed by Taxol. She got 11 weeks of Taxol.  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 She then received chest wall radiation.  This was then followed by Femara.  She did well on Femara.  She has had followup scans up in [place].  CT scan last done shows these I think subcentimeter pulmonary nodules.  These have been too small to biopsy.  These have not shown any evidence of progression.  She actually was hospitalized recently because of pneumonia.  She was over at [facility].  She had a chest x-ray done.  The chest x-ray showed right base opacity which was felt to be the pneumonia.  She had interstitial edema.  She was given IV and oral antibiotics.  She now comes to the [facility] for continued followup for her stage IIIC breast cancer.  She feels okay.  Again, she is getting over pneumonia.  She does have other health issues, particularly diabetes.  She has a good appetite.  She has had no cough.  There is no pain. There is no nausea or vomiting.  There has been no change in bowel or bladder habits.  She has had no rashes.  PAST MEDICAL HISTORY:  Remarkable for: 1. Non-insulin-dependent diabetes. 2. Hypertension. 3. Hyperlipidemia.  ALLERGIES:  None.  MEDICATIONS: 1. Aspirin 325 mg p.o. daily. 2. Klonopin 0.5 mg p.o. q.h.s. p.r.n. 3. Amaryl 2 mg p.o. q.h.s. 4. Femara 2.5 mg p.o. daily. 5. Zestril 20 mg p.o.  daily. 6. Glucophage 500 mg p.o. b.i.d. 7. Lopressor 25 mg p.o. b.i.d. 8. Potassium chloride 40 mEq p.o. daily. 9. Pravastatin 40 mg p.o. daily.  SOCIAL HISTORY:  Remarkable for past tobacco use.  She stopped when she had this pneumonia recently.  She probably has about a 40 pack year history of tobacco use.  There is no significant alcohol use.  FAMILY HISTORY:  Noncontributory.  There is history of diabetes in the family.  Her gynecologic history shows menarche at age 11.  Menopause in her late 35s.  There is no history of estrogen replacement therapy.  REVIEW OF SYSTEMS:  As stated in history of present illness.  No additional findings noted on a 12 system review.  PHYSICAL EXAMINATION:  General:  This is a well-developed, well- nourished white female in no obvious distress.  Vital signs: Temperature of 97.1, pulse 81, respiratory rate 20, blood pressure 152/75.  Weight is 201.  Head and neck:  Exam shows a normocephalic, atraumatic skull.  There are no ocular or oral lesions.  There are no palpable cervical or supraclavicular lymph nodes.  Lungs:  Clear bilaterally.  There are no rales, wheezes or rhonchi.  Cardiac:  Regular rate and rhythm with a normal S1 and S2.  There are no murmurs, rubs or bruits.  Abdomen:  Soft with good bowel sounds.  There is no fluid wave. There is no palpable abdominal mass.  There  is no palpable hepatosplenomegaly.  Breasts:  Shows left breast no masses, edema or erythema.  There is no left axillary adenopathy.  Right chest wall shows well-healed mastectomy.  She has no right chest wall nodules.  She has no erythema of the right anterior chest wall.  There is no right axillary adenopathy.  Back:  Exam shows no tenderness over the spine, ribs or hips.  She may have some slight kyphosis.  Extremities:  There is no clubbing, cyanosis or edema.  She has no lymphedema of the right arm.  She has good range of motion of her joints.  Skin:  No  rashes, ecchymoses or petechiae.  Neurological:  Shows no focal neurological deficits.  LABORATORY STUDIES:  Show a white cell count of 8.5, hemoglobin 11.8, hematocrit 35.0, platelet count 359.  IMPRESSION:  Ms. Holcomb is a very nice 71 year old white female with history of stage IIIC ductal carcinoma of the right breast.  She had 11 lymph nodes positive.  She underwent mastectomy followed by aggressive adjuvant chemotherapy.  I think 1 issue is whether or not she does have recurrent disease.  She had a CT scan done earlier in the year which was felt to be suspicious for metastatic disease.  However, going back through the records, the x- ray findings showed abnormalities back over a year ago.  One would have to think that if she did have recurrent disease, that it would clearly show up by now.  I think that we can hold on doing any kind of scans on her.  I think that we will have to follow her clinically.  She wanted to have a mammogram done.  We will get this set up for the left breast.  She has a Port-A-Cath in.  We are going to have to make sure this gets flushed on a routine basis.  We will probably follow her up every 3 months.  I would keep her on Femara for longer than 5 years.  I really believe that Femara is going to be very helpful for her.  Again, she had 11 lymph nodes that were positive.  She is awfully nice.  I spent a good hour or more with her today.    ______________________________ Josph Macho, M.D. PRE/MEDQ  D:  04/10/2012  T:  04/10/2012  Job:  [id number]

## 2012-04-10 NOTE — Telephone Encounter (Signed)
Pt son called in to advise that pt medication refills could not be filled by the pharmacy [facility] at [address] per pharmacy advised they coyuld see where MD Beverely Low has sent the medications but unable to send to the pt, noted medications in need of refilling are glimepiride, metformin and pravastatin, noted in chart that pt medications have been sent to [facility] on [address], not [address] as well as noted the pt has the medications sent to [facility] on [address] in the past, left vm for pt to clarify which pharmacy the pt wants the medications sent to.

## 2012-04-10 NOTE — Progress Notes (Signed)
This office note has been dictated.

## 2012-04-11 ENCOUNTER — Encounter: Payer: Self-pay | Admitting: Physician Assistant

## 2012-04-11 ENCOUNTER — Ambulatory Visit (INDEPENDENT_AMBULATORY_CARE_PROVIDER_SITE_OTHER): Payer: Medicare Other | Admitting: Physician Assistant

## 2012-04-11 ENCOUNTER — Telehealth: Payer: Self-pay | Admitting: Hematology & Oncology

## 2012-04-11 VITALS — BP 144/80 | HR 79 | Ht 63.0 in | Wt 201.0 lb

## 2012-04-11 DIAGNOSIS — R7989 Other specified abnormal findings of blood chemistry: Secondary | ICD-10-CM

## 2012-04-11 DIAGNOSIS — R946 Abnormal results of thyroid function studies: Secondary | ICD-10-CM

## 2012-04-11 DIAGNOSIS — I4891 Unspecified atrial fibrillation: Secondary | ICD-10-CM

## 2012-04-11 DIAGNOSIS — I10 Essential (primary) hypertension: Secondary | ICD-10-CM

## 2012-04-11 LAB — COMPREHENSIVE METABOLIC PANEL
AST: 11 U/L (ref 0–37)
Albumin: 3.5 g/dL (ref 3.5–5.2)
Alkaline Phosphatase: 75 U/L (ref 39–117)
Potassium: 4.9 mEq/L (ref 3.5–5.3)
Sodium: 137 mEq/L (ref 135–145)
Total Protein: 6.2 g/dL (ref 6.0–8.3)

## 2012-04-11 NOTE — Patient Instructions (Addendum)
Your physician recommends that you schedule a follow-up appointment in: 4-6 MONTHS WITH DR. Excell Seltzer  NO CHANGES WERE MADE TODAY

## 2012-04-11 NOTE — Telephone Encounter (Signed)
Mailed mammogram for 05-08-12 ,july flush and September MD schedule

## 2012-04-11 NOTE — Progress Notes (Signed)
[address] [place], [place]  [address] Phone: [phone] Fax:  [phone]  Date:  04/11/2012   Name:  Darlene Harrell   DOB:  [birth year]   MRN:  [id number]  PCP:  Neena Rhymes, MD  Primary Cardiologist:  Dr. Tonny Bollman  Primary Electrophysiologist:  None    History of Present Illness: Darlene Harrell is a 71 y.o. female who returns for post hospital follow up.  She has a h/o DM2, HTN, HL, depression, breast CA, s/p mastectomy and chemoTx (Dr. Myna Hidalgo).  She was admitted 5/29-6/4 with pneumonia.  She had assoc hypoNa and hypoK from dehydration.  During her admission, she developed atrial fibrillation.  She was seen by Dr. Myrtis Ser.  Her rate was rapid.  She was placed on Cardizem and IV amiodarone.  Echocardiogram 03/30/12: Mild LVH, normal LV function, EF 60-65%, no wall motion abnormalities, mild MR, mild LAE, PASP 32.  The patient has a high thromboembolic risk factor profile.  She was resistant to starting anticoagulation.  Therefore, this was not started.  She did revert to normal sinus rhythm.  Her amiodarone was stopped.  It was felt that her A. Fib was likely related to her acute illness.  She has recurrent A. Fib in the future, anticoagulation will need to be started.  TSH was low in the hospital, but Free T4 was ok.  Sick euthyroid was questioned and she is to have f/u with her PCP.    Since d/c, doing well.  The patient denies chest pain, syncope, orthopnea, PND or significant pedal edema.  She does have dyspnea with more extreme activities but this is improving.  No palpitations.    Wt Readings from Last 3 Encounters:  04/11/12 201 lb (91.173 kg)  04/10/12 201 lb (91.173 kg)  04/03/12 203 lb 8 oz (92.307 kg)     Potassium  Date/Time Value Range Status  04/10/2012 10:45 AM 4.9  3.5 - 5.3 mEq/L Final     Creatinine, Ser  Date/Time Value Range Status  04/10/2012 10:45 AM 0.76  0.50 - 1.10 mg/dL Final     ALT  Date/Time Value Range Status    04/10/2012 10:45 AM 13  0 - 35 U/L Final     TSH  Date/Time Value Range Status  03/31/2012  3:21 AM 0.015* 0.350 - 4.500 uIU/mL Final     Past Medical History  Diagnosis Date  . Breast cancer     Dr. Myna Hidalgo  . Diabetes mellitus   . Hypertension   . Depression   . Syncopal episodes   . Arthritis   . High cholesterol   . Blood in stool     occasional  . Chicken pox   . Fainting   . Atrial fibrillation     in setting of Pneumonia 6/13 - Echocardiogram 03/30/12: Mild LVH, normal LV function, EF 60-65%, no wall motion abnormalities, mild MR, mild LAE, PASP 32.    Current Outpatient Prescriptions  Medication Sig Dispense Refill  . aspirin EC 325 MG EC tablet Take 1 tablet (325 mg total) by mouth daily.  30 tablet  0  . clonazePAM (KLONOPIN) 0.5 MG tablet Take 1 tablet (0.5 mg total) by mouth at bedtime as needed.  30 tablet  1  . glimepiride (AMARYL) 2 MG tablet Take 1 tablet (2 mg total) by mouth at bedtime.  30 tablet  1  . letrozole (FEMARA) 2.5 MG tablet Take 2.5 mg by mouth daily.      Marland Kitchen lisinopril (  PRINIVIL,ZESTRIL) 20 MG tablet Take 1 tablet (20 mg total) by mouth daily.  30 tablet  0  . metFORMIN (GLUCOPHAGE) 500 MG tablet Take 1 tablet (500 mg total) by mouth 2 (two) times daily with a meal.  60 tablet  1  . metoprolol tartrate (LOPRESSOR) 25 MG tablet Take 1 tablet (25 mg total) by mouth 2 (two) times daily.  60 tablet  0  . potassium chloride SA (K-DUR,KLOR-CON) 20 MEQ tablet Take 2 tablets (40 mEq total) by mouth daily.  60 tablet  0  . pravastatin (PRAVACHOL) 40 MG tablet Take 1 tablet (40 mg total) by mouth daily.  30 tablet  1    Allergies: No Known Allergies  History  Substance Use Topics  . Smoking status: Former Smoker -- 1.0 packs/day for 48 years    Types: Cigarettes    Quit date: 03/24/2012  . Smokeless tobacco: Never Used  . Alcohol Use: Yes     4 drinks a year     PHYSICAL EXAM: VS:  BP 144/80  Pulse 79  Ht 5\' 3"  (1.6 m)  Wt 201 lb (40.981 kg)   BMI 35.61 kg/m2 Well nourished, well developed, in no acute distress HEENT: normal Neck: no JVD Cardiac:  normal S1, S2; RRR; no murmur Lungs:  clear to auscultation bilaterally, no wheezing, rhonchi or rales Abd: soft, nontender, no hepatomegaly Ext: no edema Skin: warm and dry Neuro:  CNs 2-12 intact, no focal abnormalities noted  EKG:  NSR, HR 74, normal axis, no acute changes   ASSESSMENT AND PLAN:  1.  Paroxysmal Atrial Fibrillation Maintaining NSR. AF was likely related to e-lyte abnormalities and acute illness. Continue metoprolol and ASA. CHADS2 = 2.  If AF recurs, she will need anti-coag Rx.  D/w patient today. Follow up with me or Dr. Tonny Bollman in 4-6 mos, or sooner if AF recurs.  2.  Hypertension Elevated today. Continue to monitor.  3.  Pneumonia Resolved.  4.  Abnormal TSH Likely sick euthyroid. She has follow up with her PCP.   SignedTereso Newcomer, PA-C  10:06 AM 04/11/2012

## 2012-04-12 ENCOUNTER — Ambulatory Visit (INDEPENDENT_AMBULATORY_CARE_PROVIDER_SITE_OTHER): Payer: Medicare Other | Admitting: Family Medicine

## 2012-04-12 ENCOUNTER — Encounter: Payer: Self-pay | Admitting: Family Medicine

## 2012-04-12 VITALS — BP 132/78 | HR 78 | Temp 98.3°F | Ht 63.5 in | Wt 202.0 lb

## 2012-04-12 DIAGNOSIS — E559 Vitamin D deficiency, unspecified: Secondary | ICD-10-CM | POA: Insufficient documentation

## 2012-04-12 DIAGNOSIS — E876 Hypokalemia: Secondary | ICD-10-CM | POA: Diagnosis not present

## 2012-04-12 DIAGNOSIS — E119 Type 2 diabetes mellitus without complications: Secondary | ICD-10-CM | POA: Diagnosis not present

## 2012-04-12 DIAGNOSIS — E0781 Sick-euthyroid syndrome: Secondary | ICD-10-CM | POA: Insufficient documentation

## 2012-04-12 DIAGNOSIS — R21 Rash and other nonspecific skin eruption: Secondary | ICD-10-CM | POA: Diagnosis not present

## 2012-04-12 DIAGNOSIS — R6889 Other general symptoms and signs: Secondary | ICD-10-CM

## 2012-04-12 DIAGNOSIS — R7989 Other specified abnormal findings of blood chemistry: Secondary | ICD-10-CM

## 2012-04-12 DIAGNOSIS — J189 Pneumonia, unspecified organism: Secondary | ICD-10-CM | POA: Diagnosis not present

## 2012-04-12 NOTE — Patient Instructions (Addendum)
We'll notify you of your lab results and make any changes if needed STOP the potassium pills STOP the Vitamin D and see if the rash improves- if not, call me.  If so, we'll send a prescription for a different kind Call with any questions or concerns I'm so glad you're feeling better!!!

## 2012-04-12 NOTE — Progress Notes (Signed)
  Subjective:    Patient ID: Darlene Harrell, female    DOB: 1941/08/24, 71 y.o.   MRN: [id number]  HPI Hospital f/u- was admitted on 5/29 w/ PNA.  Developed Afib while in the hospital.  Refused anti-coag.  On ASA.  Saw Tereso Newcomer yesterday and felt that Afib was due to strain of PNA and hypokalemia.  Was found during hospitalization to have low TSH but normal T3/T4.  Due to have labs rechecked.  K+ was normal 2 days ago w/ Dr Myna Hidalgo.  Wants to stop K+ due to size of pills.  Rash- broke out this AM w/ 'it looks like prickly heat' on chest.  Mildly itchy.  No other areas.  Started Vit D 2 days ago.    Review of Systems For ROS see HPI     Objective:   Physical Exam  Constitutional: She is oriented to person, place, and time. She appears well-developed and well-nourished. No distress.  HENT:  Head: Normocephalic and atraumatic.  Eyes: Conjunctivae and EOM are normal. Pupils are equal, round, and reactive to light.  Neck: Normal range of motion. Neck supple. No thyromegaly present.  Cardiovascular: Normal rate, regular rhythm, normal heart sounds and intact distal pulses.   No murmur heard. Pulmonary/Chest: Effort normal and breath sounds normal. No respiratory distress.  Abdominal: Soft. She exhibits no distension. There is no tenderness.  Musculoskeletal: She exhibits no edema.  Lymphadenopathy:    She has no cervical adenopathy.  Neurological: She is alert and oriented to person, place, and time.  Skin: Skin is warm and dry. Rash (diffuse maculopapular rash) noted.  Psychiatric: She has a normal mood and affect. Her behavior is normal.          Assessment & Plan:

## 2012-04-13 ENCOUNTER — Other Ambulatory Visit: Payer: Self-pay

## 2012-04-27 ENCOUNTER — Telehealth: Payer: Self-pay | Admitting: *Deleted

## 2012-04-27 NOTE — Telephone Encounter (Signed)
Called patient to let her know that her vitamin d levels were low and needs to start on Vitamin D 2000 u daily.  Patient states she tried taking it over the counter and broke out in prickly heat type rash.  Patient states her daughter in law has Vitamin D gel that she is going to try.

## 2012-04-27 NOTE — Telephone Encounter (Signed)
Message copied by Anselm Jungling on Fri Apr 27, 2012 12:06 PM ------      Message from: Josph Macho      Created: Fri Apr 13, 2012  7:45 AM       Call - Vit D is low!!!  Need to make sure she is on 2000u a day!!!  pete

## 2012-04-29 NOTE — Assessment & Plan Note (Signed)
New.  Pt developed drug rash from OTC brand of Vit D.  Hold for time being to see if rash improves after stopping med.  Will follow.

## 2012-04-29 NOTE — Assessment & Plan Note (Signed)
New to provider.  Pt was hospitalized for this.  Recovering well.  Lungs CTA today.  Pt reports feeling 'much better'.  Will continue to follow.

## 2012-04-29 NOTE — Assessment & Plan Note (Signed)
New.  Discovered during hospitalization for PNA.  Repeat labs to ensure now normal.

## 2012-04-29 NOTE — Assessment & Plan Note (Signed)
New.  Discovered during hospitalization and thought to be the cause of her Afib.  Repeat labs 2 days ago w/ Dr Myna Hidalgo showed normal K+.  Pt would like to stop meds due to size of pills.  Ok w/ this- discussed dietary sources of K+.  Pt agrees to increase this.

## 2012-04-29 NOTE — Assessment & Plan Note (Signed)
Chronic problem for pt.  Does not have recent A1C on file.  Due for labs.  Adjust meds prn.

## 2012-04-29 NOTE — Assessment & Plan Note (Signed)
New.  Consistent w/ drug rash.  Only new med is Vit D.  Discussed that she is likely not allergic to the actual vitamin but rather one of the fillers or pill binders used in that particular brand.  Pt to stop med and see if sxs improve.  Pt expressed understanding and is in agreement w/ plan.

## 2012-04-30 ENCOUNTER — Other Ambulatory Visit (HOSPITAL_COMMUNITY): Payer: Self-pay | Admitting: Internal Medicine

## 2012-05-01 ENCOUNTER — Telehealth: Payer: Self-pay | Admitting: Family Medicine

## 2012-05-01 MED ORDER — METFORMIN HCL 500 MG PO TABS: 500.0000 mg | ORAL_TABLET | Freq: Two times a day (BID) | ORAL | Status: DC

## 2012-05-01 MED ORDER — CLONAZEPAM 0.5 MG PO TABS: 0.5000 mg | ORAL_TABLET | Freq: Every evening | ORAL | Status: DC | PRN

## 2012-05-01 NOTE — Telephone Encounter (Signed)
Refill: Metformin 500mg  tab. Take one tablet by mouth twice daily with a meal. Qty 60. Last fill 04-23-12. Note to doctor: Refill 7/1

## 2012-05-01 NOTE — Telephone Encounter (Signed)
Refill: Klonopin 0.5mg  tab. Take one tablt by mouth at bedtime as needed. Qty 30. Last fill 04-07-12

## 2012-05-01 NOTE — Telephone Encounter (Signed)
Ok for #30, 1 refill for [employer]

## 2012-05-01 NOTE — Telephone Encounter (Signed)
.  rx faxed to pharmacy, manually for Klonopin, metformin via escribe

## 2012-05-01 NOTE — Telephone Encounter (Signed)
Last OV 04-12-12, last refill 03-15-12 #30 with 1 refill please advise per Klonopin request

## 2012-05-02 MED ORDER — LISINOPRIL 20 MG PO TABS: 20.0000 mg | ORAL_TABLET | Freq: Every day | ORAL | Status: DC

## 2012-05-02 MED ORDER — METOPROLOL TARTRATE 25 MG PO TABS: 25.0000 mg | ORAL_TABLET | Freq: Two times a day (BID) | ORAL | Status: DC

## 2012-05-02 MED ORDER — METFORMIN HCL 1000 MG PO TABS: 1000.0000 mg | ORAL_TABLET | Freq: Two times a day (BID) | ORAL | Status: DC

## 2012-05-02 NOTE — Telephone Encounter (Addendum)
Pt is requesting refill on lisinopril and metoprolol . Marland KitchenPlease advise ok to fill      Per Labs done 04-12-12 med changed to Metformin to 1000 mg bid med list not updated to reflect changes. Rx and med list both updated. Rx sent for correct med.

## 2012-05-02 NOTE — Addendum Note (Signed)
Addended by: Candie Echevaria L on: 05/02/2012 01:11 PM   Modules accepted: Orders

## 2012-05-02 NOTE — Telephone Encounter (Signed)
Ok for Metformin 1000mg  BID, #60, 6 refills Ok for for Metoprolol x6

## 2012-05-02 NOTE — Addendum Note (Signed)
Addended by: Derry Lory A on: 05/02/2012 01:29 PM   Modules accepted: Orders

## 2012-05-02 NOTE — Telephone Encounter (Signed)
rx sent to pharmacy by e-script for metoprolol and lisinopril per noted metformin sent earlier today

## 2012-05-08 ENCOUNTER — Ambulatory Visit
Admission: RE | Admit: 2012-05-08 | Discharge: 2012-05-08 | Disposition: A | Discharge status: Home / Self Care | Payer: Medicare Other | Source: Ambulatory Visit | Attending: Hematology & Oncology | Admitting: Hematology & Oncology

## 2012-05-08 DIAGNOSIS — M810 Age-related osteoporosis without current pathological fracture: Secondary | ICD-10-CM

## 2012-05-08 DIAGNOSIS — Z1231 Encounter for screening mammogram for malignant neoplasm of breast: Secondary | ICD-10-CM | POA: Diagnosis not present

## 2012-05-08 DIAGNOSIS — C50919 Malignant neoplasm of unspecified site of unspecified female breast: Secondary | ICD-10-CM

## 2012-05-17 ENCOUNTER — Telehealth: Payer: Self-pay | Admitting: Family Medicine

## 2012-05-17 NOTE — Telephone Encounter (Signed)
Refill: Lisinopril 20mg  tab. Take one tablet by mouth every day. Qty 30. Last fill 04-03-12

## 2012-05-17 NOTE — Telephone Encounter (Signed)
Spoke with pharmacy & they confirmed pt still has refills left on file. Request sent in error.

## 2012-05-23 ENCOUNTER — Ambulatory Visit (HOSPITAL_BASED_OUTPATIENT_CLINIC_OR_DEPARTMENT_OTHER): Payer: Medicare Other

## 2012-05-23 VITALS — BP 167/83 | HR 72 | Temp 97.0°F

## 2012-05-23 DIAGNOSIS — C50919 Malignant neoplasm of unspecified site of unspecified female breast: Secondary | ICD-10-CM

## 2012-05-23 DIAGNOSIS — Z452 Encounter for adjustment and management of vascular access device: Secondary | ICD-10-CM

## 2012-05-23 DIAGNOSIS — C773 Secondary and unspecified malignant neoplasm of axilla and upper limb lymph nodes: Secondary | ICD-10-CM

## 2012-05-23 MED ORDER — HEPARIN SOD (PORK) LOCK FLUSH 100 UNIT/ML IV SOLN
500.0000 [IU] | Freq: Once | INTRAVENOUS | Status: AC
  Administered 2012-05-23: 500 [IU] via INTRAVENOUS
  Filled 2012-05-23: qty 5

## 2012-05-23 MED ORDER — SODIUM CHLORIDE 0.9 % IJ SOLN
10.0000 mL | INTRAMUSCULAR | Status: DC | PRN
  Administered 2012-05-23: 10 mL via INTRAVENOUS
  Filled 2012-05-23: qty 10

## 2012-05-23 NOTE — Patient Instructions (Signed)

## 2012-06-10 ENCOUNTER — Other Ambulatory Visit: Payer: Self-pay | Admitting: Family Medicine

## 2012-06-11 NOTE — Telephone Encounter (Signed)
rx sent to pharmacy by e-script  

## 2012-06-25 ENCOUNTER — Other Ambulatory Visit: Payer: Self-pay | Admitting: Family Medicine

## 2012-06-26 NOTE — Telephone Encounter (Signed)
rx sent to pharmacy by e-script  

## 2012-07-11 ENCOUNTER — Ambulatory Visit (HOSPITAL_BASED_OUTPATIENT_CLINIC_OR_DEPARTMENT_OTHER): Payer: Medicare Other | Admitting: Hematology & Oncology

## 2012-07-11 ENCOUNTER — Other Ambulatory Visit (HOSPITAL_BASED_OUTPATIENT_CLINIC_OR_DEPARTMENT_OTHER): Payer: Medicare Other | Admitting: Lab

## 2012-07-11 ENCOUNTER — Ambulatory Visit: Payer: Medicare Other

## 2012-07-11 VITALS — BP 145/73 | HR 81 | Temp 98.3°F | Resp 20 | Ht 63.0 in | Wt 201.0 lb

## 2012-07-11 DIAGNOSIS — Z170 Estrogen receptor positive status [ER+]: Secondary | ICD-10-CM

## 2012-07-11 DIAGNOSIS — C50919 Malignant neoplasm of unspecified site of unspecified female breast: Secondary | ICD-10-CM | POA: Diagnosis not present

## 2012-07-11 DIAGNOSIS — M810 Age-related osteoporosis without current pathological fracture: Secondary | ICD-10-CM | POA: Diagnosis not present

## 2012-07-11 DIAGNOSIS — C773 Secondary and unspecified malignant neoplasm of axilla and upper limb lymph nodes: Secondary | ICD-10-CM

## 2012-07-11 LAB — CBC WITH DIFFERENTIAL (CANCER CENTER ONLY)
BASO#: 0 10*3/uL (ref 0.0–0.2)
Eosinophils Absolute: 0.3 10*3/uL (ref 0.0–0.5)
LYMPH%: 19.7 % (ref 14.0–48.0)
MCH: 29.2 pg (ref 26.0–34.0)
MCV: 87 fL (ref 81–101)
MONO%: 14.6 % — ABNORMAL HIGH (ref 0.0–13.0)
NEUT%: 61.4 % (ref 39.6–80.0)
Platelets: 275 10*3/uL (ref 145–400)
RBC: 4.31 10*6/uL (ref 3.70–5.32)

## 2012-07-11 NOTE — Progress Notes (Signed)
This office note has been dictated.

## 2012-07-12 LAB — COMPREHENSIVE METABOLIC PANEL
Alkaline Phosphatase: 77 U/L (ref 39–117)
BUN: 12 mg/dL (ref 6–23)
Creatinine, Ser: 0.6 mg/dL (ref 0.50–1.10)
Glucose, Bld: 123 mg/dL — ABNORMAL HIGH (ref 70–99)
Sodium: 139 mEq/L (ref 135–145)
Total Bilirubin: 0.3 mg/dL (ref 0.3–1.2)

## 2012-07-12 LAB — VITAMIN D 25 HYDROXY (VIT D DEFICIENCY, FRACTURES): Vit D, 25-Hydroxy: 51 ng/mL (ref 30–89)

## 2012-07-12 NOTE — Progress Notes (Signed)
CC:   Darlene Harrell, M.D.  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  CURRENT THERAPY:  Femara 2.5 mg p.o. daily.  INTERIM HISTORY:  Darlene Harrell comes in for followup.  We first saw her back in June.  She has been on Femara.  She was started on Femara back when she is up in [place].  This was back in 2011.  She has had no problems with the Femara.  She has had no arthralgias or myalgias.  She has had no cough or shortness breath.  She has had no nausea or vomiting.  There has been no change in bowel or bladder habits.  She is thinking about moving back to [place].  She has a lot of family up in [place].  She thinks that she would be more comfortable back home.  She is not sure when she would move.  She has had no headaches.  There has been no dysphagia or odynophagia.  She does have diabetes.  She said her blood sugars have been fairly well controlled.  PHYSICAL EXAMINATION:  This is a well-developed, well-nourished white female in no obvious distress.  Vital signs:  98.3, pulse 81, respiratory rate 18, blood pressure 145/73.  Weight is 201.  Head and neck:  Normocephalic, atraumatic skull.  There are no ocular or oral lesions.  There are no palpable cervical or supraclavicular lymph nodes. Lungs:  Clear bilaterally.  Cardiac:  Regular rate and rhythm with a normal S1 and S2.  There are no murmurs, rubs or bruits.  Breasts:  Left breast with no masses, edema or erythema.  There is no left axillary adenopathy.  Right chest wall shows a well-healed mastectomy.  No right chest wall nodules were noted.  She has no right axillary adenopathy. Abdomen:  Soft with good bowel sounds.  There is no palpable abdominal mass.  There is no fluid wave.  There is no palpable hepatosplenomegaly. Back:  No tenderness over the spine, ribs, or hips.  Extremities:  No clubbing, cyanosis or edema.  No lymphedema is noted in the right arm. Skin:  Does show a sebaceous  cyst on the right medial anterior chest wall.  We tried to drain this in the office and were successful.  LABORATORY STUDIES:  White cell count 7.5, hemoglobin 12.6, hematocrit 37.4, platelet count is 275.  IMPRESSION:  Darlene Harrell is a 71 year old white female with IIIC (T3 N2 M0) ductal carcinoma of the right breast.  She had 11 lymph nodes positive.  Her tumor is estrogen receptor positive and HER2 negative. She is on Femara.  We will continue the Femara.  I think that 5 years will not be adequate given the number of positive lymph nodes.  I would almost treat Darlene Harrell as if this was metastatic and just keep her on Femara long-term.  Again, we will see if she stays down here.  If not, she will just follow up with her doctors up in [place].  I do not see that we have to do any scans on her right now.  She is pretty much asymptomatic.  We will see her back in 3 more months.    ______________________________ Josph Macho, M.D. PRE/MEDQ  D:  07/11/2012  T:  07/12/2012  Job:  [id number]

## 2012-07-13 ENCOUNTER — Ambulatory Visit: Payer: Medicare Other | Admitting: Family Medicine

## 2012-07-19 ENCOUNTER — Ambulatory Visit (INDEPENDENT_AMBULATORY_CARE_PROVIDER_SITE_OTHER): Payer: Medicare Other | Admitting: Family Medicine

## 2012-07-19 ENCOUNTER — Encounter: Payer: Self-pay | Admitting: Family Medicine

## 2012-07-19 ENCOUNTER — Encounter: Payer: Self-pay | Admitting: *Deleted

## 2012-07-19 VITALS — BP 137/85 | HR 90 | Temp 98.5°F | Ht 62.5 in | Wt 203.4 lb

## 2012-07-19 DIAGNOSIS — I10 Essential (primary) hypertension: Secondary | ICD-10-CM

## 2012-07-19 DIAGNOSIS — E119 Type 2 diabetes mellitus without complications: Secondary | ICD-10-CM | POA: Diagnosis not present

## 2012-07-19 DIAGNOSIS — Z23 Encounter for immunization: Secondary | ICD-10-CM | POA: Diagnosis not present

## 2012-07-19 DIAGNOSIS — E785 Hyperlipidemia, unspecified: Secondary | ICD-10-CM

## 2012-07-19 LAB — HEMOGLOBIN A1C: Hgb A1c MFr Bld: 6.9 % — ABNORMAL HIGH (ref 4.6–6.5)

## 2012-07-19 LAB — TSH: TSH: 0.11 u[IU]/mL — ABNORMAL LOW (ref 0.35–5.50)

## 2012-07-19 LAB — LIPID PANEL
Cholesterol: 161 mg/dL (ref 0–200)
HDL: 39.3 mg/dL (ref >39.00)
Triglycerides: 130 mg/dL (ref 0.0–149.0)

## 2012-07-19 MED ORDER — METOPROLOL TARTRATE 25 MG PO TABS: 25.0000 mg | ORAL_TABLET | Freq: Two times a day (BID) | ORAL | Status: DC

## 2012-07-19 MED ORDER — GLIMEPIRIDE 2 MG PO TABS: 2.0000 mg | ORAL_TABLET | Freq: Every day | ORAL | Status: DC

## 2012-07-19 MED ORDER — PRAVASTATIN SODIUM 40 MG PO TABS: 40.0000 mg | ORAL_TABLET | Freq: Every day | ORAL | Status: DC

## 2012-07-19 MED ORDER — LISINOPRIL 20 MG PO TABS: 20.0000 mg | ORAL_TABLET | Freq: Every day | ORAL | Status: DC

## 2012-07-19 MED ORDER — METFORMIN HCL 1000 MG PO TABS: 1000.0000 mg | ORAL_TABLET | Freq: Two times a day (BID) | ORAL | Status: DC

## 2012-07-19 NOTE — Assessment & Plan Note (Signed)
Chronic problem, overdue on eye exam.  Encouraged her to schedule this.  Currently asymptomatic.  Not checking CBGs.  Check labs.  Adjust meds prn.

## 2012-07-19 NOTE — Patient Instructions (Addendum)
Follow up in 3 months to recheck diabetes We'll notify you of your lab results and make any changes if needed Schedule your eye exam Call with any questions or concerns Happy Early Birthday!!!

## 2012-07-19 NOTE — Assessment & Plan Note (Signed)
Chronic problem.  Adequate control.  Asymptomatic.  No changes. 

## 2012-07-19 NOTE — Assessment & Plan Note (Signed)
Chronic problem.  Tolerating statin w/out difficulty.  No lipid profile on file.  Due for labs.  Adjust meds prn.

## 2012-07-19 NOTE — Progress Notes (Signed)
  Subjective:    Patient ID: Darlene Harrell, female    DOB: [birth year], 71 y.o.   MRN: [id number]  HPI DM- chronic problem.  Last A1C 7.7.  Metformin was increased.  Not checking CBGs.  Denies symptomatic lows.  Overdue on eye exam.  No numbness/tingling hands/feet.  Intermittent diarrhea.  Hyperlipidemia- chronic problem, overdue on labs.  On Pravastatin.  Denies abd pain, N/V, myalgias.  HTN- chronic problem, on Lisinopril and metoprolol.  Adequate control.  No CP, SOB, HAs, visual changes, edema.   Review of Systems For ROS see HPI     Objective:   Physical Exam  Vitals reviewed. Constitutional: She is oriented to person, place, and time. She appears well-developed and well-nourished. No distress.  HENT:  Head: Normocephalic and atraumatic.  Eyes: Conjunctivae normal and EOM are normal. Pupils are equal, round, and reactive to light.  Neck: Normal range of motion. Neck supple. No thyromegaly present.  Cardiovascular: Normal rate, regular rhythm, normal heart sounds and intact distal pulses.   No murmur heard. Pulmonary/Chest: Effort normal and breath sounds normal. No respiratory distress.  Abdominal: Soft. She exhibits no distension. There is no tenderness.  Musculoskeletal: She exhibits no edema.  Lymphadenopathy:    She has no cervical adenopathy.  Neurological: She is alert and oriented to person, place, and time.  Skin: Skin is warm and dry.  Psychiatric: She has a normal mood and affect. Her behavior is normal.          Assessment & Plan:

## 2012-07-26 ENCOUNTER — Other Ambulatory Visit (INDEPENDENT_AMBULATORY_CARE_PROVIDER_SITE_OTHER): Payer: Medicare Other

## 2012-07-26 DIAGNOSIS — E785 Hyperlipidemia, unspecified: Secondary | ICD-10-CM

## 2012-07-26 DIAGNOSIS — R7989 Other specified abnormal findings of blood chemistry: Secondary | ICD-10-CM

## 2012-07-26 DIAGNOSIS — R6889 Other general symptoms and signs: Secondary | ICD-10-CM

## 2012-07-26 LAB — TSH: TSH: 0.02 u[IU]/mL — ABNORMAL LOW (ref 0.35–5.50)

## 2012-07-27 ENCOUNTER — Encounter: Payer: Self-pay | Admitting: *Deleted

## 2012-08-21 ENCOUNTER — Telehealth: Payer: Self-pay | Admitting: Hematology & Oncology

## 2012-08-21 NOTE — Telephone Encounter (Signed)
Patient called and cx 08/22/12 appt and stated she will call back later to resch

## 2012-09-13 DIAGNOSIS — C50919 Malignant neoplasm of unspecified site of unspecified female breast: Secondary | ICD-10-CM | POA: Diagnosis not present

## 2012-09-13 DIAGNOSIS — Z4682 Encounter for fitting and adjustment of non-vascular catheter: Secondary | ICD-10-CM | POA: Diagnosis not present

## 2012-09-26 DIAGNOSIS — C50919 Malignant neoplasm of unspecified site of unspecified female breast: Secondary | ICD-10-CM | POA: Diagnosis not present

## 2012-10-03 ENCOUNTER — Ambulatory Visit: Payer: Medicare Other | Admitting: Medical

## 2012-10-03 ENCOUNTER — Other Ambulatory Visit: Payer: Medicare Other | Admitting: Lab

## 2012-10-08 ENCOUNTER — Other Ambulatory Visit: Payer: Self-pay | Admitting: *Deleted

## 2012-10-08 MED ORDER — GLIMEPIRIDE 2 MG PO TABS: 2.0000 mg | ORAL_TABLET | Freq: Every day | ORAL | Status: DC

## 2012-10-08 NOTE — Telephone Encounter (Signed)
Rx verbally given over the phone for #90. Per pharmacy unable to find Rx that was sent in on 07-19-12 for med.

## 2012-11-07 DIAGNOSIS — Z452 Encounter for adjustment and management of vascular access device: Secondary | ICD-10-CM | POA: Diagnosis not present

## 2012-11-07 DIAGNOSIS — C50919 Malignant neoplasm of unspecified site of unspecified female breast: Secondary | ICD-10-CM | POA: Diagnosis not present

## 2012-12-27 DIAGNOSIS — C50919 Malignant neoplasm of unspecified site of unspecified female breast: Secondary | ICD-10-CM | POA: Diagnosis not present

## 2012-12-27 DIAGNOSIS — Z452 Encounter for adjustment and management of vascular access device: Secondary | ICD-10-CM | POA: Diagnosis not present

## 2013-01-10 DIAGNOSIS — E785 Hyperlipidemia, unspecified: Secondary | ICD-10-CM | POA: Diagnosis not present

## 2013-01-10 DIAGNOSIS — I10 Essential (primary) hypertension: Secondary | ICD-10-CM | POA: Diagnosis not present

## 2013-02-07 DIAGNOSIS — Z452 Encounter for adjustment and management of vascular access device: Secondary | ICD-10-CM | POA: Diagnosis not present

## 2013-02-07 DIAGNOSIS — C50919 Malignant neoplasm of unspecified site of unspecified female breast: Secondary | ICD-10-CM | POA: Diagnosis not present

## 2013-03-26 DIAGNOSIS — C50919 Malignant neoplasm of unspecified site of unspecified female breast: Secondary | ICD-10-CM | POA: Diagnosis not present

## 2013-04-05 DIAGNOSIS — F17200 Nicotine dependence, unspecified, uncomplicated: Secondary | ICD-10-CM | POA: Diagnosis not present

## 2013-04-05 DIAGNOSIS — R911 Solitary pulmonary nodule: Secondary | ICD-10-CM | POA: Diagnosis not present

## 2013-04-05 DIAGNOSIS — Z452 Encounter for adjustment and management of vascular access device: Secondary | ICD-10-CM | POA: Diagnosis not present

## 2013-04-05 DIAGNOSIS — Z853 Personal history of malignant neoplasm of breast: Secondary | ICD-10-CM | POA: Diagnosis not present

## 2013-07-08 ENCOUNTER — Other Ambulatory Visit: Payer: Self-pay | Admitting: Family Medicine

## 2013-07-09 ENCOUNTER — Other Ambulatory Visit: Payer: Self-pay | Admitting: *Deleted

## 2013-07-09 MED ORDER — METOPROLOL TARTRATE 25 MG PO TABS: 25.0000 mg | ORAL_TABLET | Freq: Two times a day (BID) | ORAL | Status: DC

## 2013-07-09 NOTE — Telephone Encounter (Signed)
Rx was refilled for metoprol 25mg .  Ag cma

## 2013-07-23 ENCOUNTER — Encounter: Payer: Self-pay | Admitting: Family Medicine

## 2013-07-23 ENCOUNTER — Ambulatory Visit: Payer: Medicare Other | Admitting: Family Medicine

## 2013-07-23 ENCOUNTER — Ambulatory Visit (INDEPENDENT_AMBULATORY_CARE_PROVIDER_SITE_OTHER): Payer: Medicare Other | Admitting: Family Medicine

## 2013-07-23 VITALS — BP 140/84 | HR 89 | Temp 98.3°F | Ht 62.5 in | Wt 204.0 lb

## 2013-07-23 DIAGNOSIS — Z79899 Other long term (current) drug therapy: Secondary | ICD-10-CM | POA: Diagnosis not present

## 2013-07-23 DIAGNOSIS — E119 Type 2 diabetes mellitus without complications: Secondary | ICD-10-CM

## 2013-07-23 DIAGNOSIS — E785 Hyperlipidemia, unspecified: Secondary | ICD-10-CM | POA: Diagnosis not present

## 2013-07-23 DIAGNOSIS — I10 Essential (primary) hypertension: Secondary | ICD-10-CM | POA: Diagnosis not present

## 2013-07-23 DIAGNOSIS — Z23 Encounter for immunization: Secondary | ICD-10-CM | POA: Diagnosis not present

## 2013-07-23 LAB — CBC WITH DIFFERENTIAL/PLATELET
Basophils Absolute: 0 10*3/uL (ref 0.0–0.1)
Basophils Relative: 0.3 % (ref 0.0–3.0)
Eosinophils Relative: 2.6 % (ref 0.0–5.0)
HCT: 37.3 % (ref 36.0–46.0)
Hemoglobin: 12.6 g/dL (ref 12.0–15.0)
Lymphs Abs: 1.1 10*3/uL (ref 0.7–4.0)
MCV: 84.7 fl (ref 78.0–100.0)
Monocytes Absolute: 0.8 10*3/uL (ref 0.1–1.0)
Neutro Abs: 5.8 10*3/uL (ref 1.4–7.7)
Neutrophils Relative %: 73 % (ref 43.0–77.0)
RBC: 4.4 Mil/uL (ref 3.87–5.11)
RDW: 14.1 % (ref 11.5–14.6)
WBC: 7.9 10*3/uL (ref 4.5–10.5)

## 2013-07-23 LAB — HEPATIC FUNCTION PANEL
ALT: 17 U/L (ref 0–35)
AST: 16 U/L (ref 0–37)
Albumin: 3.9 g/dL (ref 3.5–5.2)
Total Bilirubin: 0.5 mg/dL (ref 0.3–1.2)

## 2013-07-23 LAB — POCT URINALYSIS DIPSTICK
Bilirubin, UA: NEGATIVE
Blood, UA: NEGATIVE
Glucose, UA: NEGATIVE
Ketones, UA: NEGATIVE
Leukocytes, UA: NEGATIVE

## 2013-07-23 LAB — TSH: TSH: 0.28 u[IU]/mL — ABNORMAL LOW (ref 0.35–5.50)

## 2013-07-23 LAB — BASIC METABOLIC PANEL
BUN: 10 mg/dL (ref 6–23)
Calcium: 9.3 mg/dL (ref 8.4–10.5)
Chloride: 105 mEq/L (ref 96–112)
Creatinine, Ser: 0.6 mg/dL (ref 0.4–1.2)
GFR: 103.08 mL/min (ref >60.00)
Glucose, Bld: 155 mg/dL — ABNORMAL HIGH (ref 70–99)
Potassium: 4.3 mEq/L (ref 3.5–5.1)

## 2013-07-23 LAB — MICROALBUMIN / CREATININE URINE RATIO
Microalb Creat Ratio: 1.6 mg/g (ref 0.0–30.0)
Microalb, Ur: 1.5 mg/dL (ref 0.0–1.9)

## 2013-07-23 LAB — LIPID PANEL: Triglycerides: 129 mg/dL (ref 0.0–149.0)

## 2013-07-23 MED ORDER — PRAVASTATIN SODIUM 40 MG PO TABS: 40.0000 mg | ORAL_TABLET | Freq: Every day | ORAL | Status: DC

## 2013-07-23 MED ORDER — LISINOPRIL-HYDROCHLOROTHIAZIDE 20-12.5 MG PO TABS: 1.0000 | ORAL_TABLET | Freq: Every day | ORAL | Status: DC

## 2013-07-23 MED ORDER — GLIMEPIRIDE 2 MG PO TABS: ORAL_TABLET | ORAL | Status: DC

## 2013-07-23 MED ORDER — METOPROLOL TARTRATE 25 MG PO TABS: 25.0000 mg | ORAL_TABLET | Freq: Two times a day (BID) | ORAL | Status: DC

## 2013-07-23 MED ORDER — METFORMIN HCL 1000 MG PO TABS: 1000.0000 mg | ORAL_TABLET | Freq: Two times a day (BID) | ORAL | Status: DC

## 2013-07-23 MED ORDER — CLONAZEPAM 0.5 MG PO TABS: 0.5000 mg | ORAL_TABLET | Freq: Every evening | ORAL | Status: DC | PRN

## 2013-07-23 NOTE — Patient Instructions (Addendum)
Schedule your complete physical in 3 months We'll notify you of your lab results and make any changes if needed Continue all meds for now- no changes Call with any questions or concerns Welcome Back! Happy Early [place] Ouch!!

## 2013-07-23 NOTE — Assessment & Plan Note (Signed)
Chronic problem.  Has been seeing MD in [place].  States she has relocated here permanently.  Due for eye exam- encouraged her to schedule.  Check labs.  Adjust meds prn.

## 2013-07-23 NOTE — Assessment & Plan Note (Signed)
Chronic problem.  Tolerating statin w/out difficulty.  Check labs.  Adjust meds prn  

## 2013-07-23 NOTE — Assessment & Plan Note (Signed)
Chronic problem.  Adequate control.  Asymptomatic.  Check labs.  No anticipated med changes 

## 2013-07-23 NOTE — Progress Notes (Signed)
  Subjective:    Patient ID: Darlene Harrell, female    DOB: [birth year], 72 y.o.   MRN: [id number]  HPI HTN- chronic problem, on Lisinopril HCTZ.  No CP, SOB, HAs, edema.  Hyperlipidemia- chronic problem, on Pravastatin.  No abd pain, N/V, myalgias.  DM- chronic problem, on Metformin and Glimepiride.  Last A1C ~6 months ago.  Overdue on eye exam but plans to schedule.  Denies symptomatic lows.  No numbness/tingling hands/feet.     Review of Systems For ROS see HPI     Objective:   Physical Exam  Vitals reviewed. Constitutional: She is oriented to person, place, and time. She appears well-developed and well-nourished. No distress.  HENT:  Head: Normocephalic and atraumatic.  Eyes: Conjunctivae and EOM are normal. Pupils are equal, round, and reactive to light.  Neck: Normal range of motion. Neck supple. No thyromegaly present.  Cardiovascular: Normal rate, regular rhythm, normal heart sounds and intact distal pulses.   No murmur heard. Pulmonary/Chest: Effort normal and breath sounds normal. No respiratory distress.  Abdominal: Soft. She exhibits no distension. There is no tenderness.  Musculoskeletal: She exhibits no edema.  Lymphadenopathy:    She has no cervical adenopathy.  Neurological: She is alert and oriented to person, place, and time.  Skin: Skin is warm and dry.  Psychiatric: She has a normal mood and affect. Her behavior is normal.          Assessment & Plan:

## 2013-07-24 ENCOUNTER — Ambulatory Visit: Payer: Medicare Other

## 2013-07-24 DIAGNOSIS — R6889 Other general symptoms and signs: Secondary | ICD-10-CM

## 2013-07-24 DIAGNOSIS — R946 Abnormal results of thyroid function studies: Secondary | ICD-10-CM

## 2013-07-24 DIAGNOSIS — R933 Abnormal findings on diagnostic imaging of other parts of digestive tract: Secondary | ICD-10-CM

## 2013-07-24 LAB — T3, FREE: T3, Free: 3.1 pg/mL (ref 2.3–4.2)

## 2013-07-24 LAB — T4, FREE: Free T4: 0.9 ng/dL (ref 0.60–1.60)

## 2013-07-26 ENCOUNTER — Encounter: Payer: Self-pay | Admitting: *Deleted

## 2013-08-07 ENCOUNTER — Encounter: Payer: Self-pay | Admitting: Family Medicine

## 2013-08-07 ENCOUNTER — Ambulatory Visit (INDEPENDENT_AMBULATORY_CARE_PROVIDER_SITE_OTHER): Payer: Medicare Other | Admitting: Family Medicine

## 2013-08-07 ENCOUNTER — Ambulatory Visit (HOSPITAL_BASED_OUTPATIENT_CLINIC_OR_DEPARTMENT_OTHER)
Admission: RE | Admit: 2013-08-07 | Discharge: 2013-08-07 | Disposition: A | Discharge status: Home / Self Care | Payer: Medicare Other | Source: Ambulatory Visit | Attending: Family Medicine | Admitting: Family Medicine

## 2013-08-07 VITALS — BP 156/88 | HR 82 | Temp 99.5°F | Resp 16 | Wt 208.0 lb

## 2013-08-07 DIAGNOSIS — M7989 Other specified soft tissue disorders: Secondary | ICD-10-CM | POA: Insufficient documentation

## 2013-08-07 DIAGNOSIS — M79609 Pain in unspecified limb: Secondary | ICD-10-CM | POA: Diagnosis not present

## 2013-08-07 DIAGNOSIS — M79661 Pain in right lower leg: Secondary | ICD-10-CM

## 2013-08-07 DIAGNOSIS — Z853 Personal history of malignant neoplasm of breast: Secondary | ICD-10-CM | POA: Diagnosis not present

## 2013-08-07 MED ORDER — TRAMADOL HCL 50 MG PO TABS: 50.0000 mg | ORAL_TABLET | Freq: Three times a day (TID) | ORAL | Status: DC | PRN

## 2013-08-07 NOTE — Progress Notes (Signed)
  Subjective:    Patient ID: Darlene Harrell, female    DOB: 06/26/1941, 72 y.o.   MRN: [id number]  HPI R lower leg pain- sxs started ~1 week ago.  Very painful, intermittently swollen.  'it feels so tight'.  No recent travel or prolonged immobility.  Pt is a current smoker.  Took ibuprofen 800mg  w/ some relief.  Pt doesn't typically complain and has high pain tolerance- this is causing her to limp.  No SOB.   Review of Systems For ROS see HPI     Objective:   Physical Exam  Vitals reviewed. Constitutional: She is oriented to person, place, and time. She appears well-developed and well-nourished.  Clearly uncomfortable  Cardiovascular: Normal rate, regular rhythm, normal heart sounds and intact distal pulses.   Pulmonary/Chest: Effort normal and breath sounds normal. No respiratory distress. She has no wheezes. She has no rales.  Musculoskeletal: She exhibits edema (R LE edema to mid calf, + TTP) and tenderness.  Neurological: She is alert and oriented to person, place, and time.  Skin: Skin is warm and dry. There is erythema.          Assessment & Plan:

## 2013-08-07 NOTE — Patient Instructions (Signed)
Elevate the leg as much as possible HEAT! Use the tramadol as needed for severe pain We'll notify you of your ultrasound results Call with any questions or concerns Hang in there!

## 2013-08-11 NOTE — Assessment & Plan Note (Signed)
New.  Must r/o DVT given pain and swelling of 1 leg.  Get stat doppler.  Tramadol for pain.  Will determine next steps based on [place] results.

## 2013-08-14 ENCOUNTER — Telehealth: Payer: Self-pay | Admitting: *Deleted

## 2013-08-14 NOTE — Telephone Encounter (Signed)
Pt called and stated that she was instructed to call back and let   us know she was feeling. Pt states she is getter better slowly. That she is doing much better than she was. If anything changes she will call at let us know.

## 2013-09-09 ENCOUNTER — Encounter: Payer: Self-pay | Admitting: Family Medicine

## 2013-10-30 ENCOUNTER — Telehealth: Payer: Self-pay

## 2013-10-30 NOTE — Telephone Encounter (Signed)
Medication and allergies:  Reviewed and updated  90 day supply/mail order: na Local pharmacy: [facility]   Immunizations due: Shingles (would like but unsure that her insurance will cover)   A/P:   No changes to FH or PSH or personal hx MMG--03/2012 Bone Density--03/2012 CCS--never had Cscope  To Discuss with Provider: Not at this time

## 2013-10-30 NOTE — Telephone Encounter (Signed)
Left message for call back with relative

## 2013-11-04 ENCOUNTER — Ambulatory Visit (INDEPENDENT_AMBULATORY_CARE_PROVIDER_SITE_OTHER): Payer: Medicare Other | Admitting: Family Medicine

## 2013-11-04 VITALS — BP 132/82 | HR 88 | Temp 98.1°F | Ht 63.5 in | Wt 207.0 lb

## 2013-11-04 DIAGNOSIS — E119 Type 2 diabetes mellitus without complications: Secondary | ICD-10-CM

## 2013-11-04 DIAGNOSIS — E559 Vitamin D deficiency, unspecified: Secondary | ICD-10-CM | POA: Diagnosis not present

## 2013-11-04 DIAGNOSIS — I10 Essential (primary) hypertension: Secondary | ICD-10-CM

## 2013-11-04 DIAGNOSIS — E785 Hyperlipidemia, unspecified: Secondary | ICD-10-CM

## 2013-11-04 DIAGNOSIS — L02419 Cutaneous abscess of limb, unspecified: Secondary | ICD-10-CM

## 2013-11-04 DIAGNOSIS — E669 Obesity, unspecified: Secondary | ICD-10-CM | POA: Insufficient documentation

## 2013-11-04 DIAGNOSIS — L03119 Cellulitis of unspecified part of limb: Secondary | ICD-10-CM

## 2013-11-04 DIAGNOSIS — Z0000 Encounter for general adult medical examination without abnormal findings: Secondary | ICD-10-CM

## 2013-11-04 LAB — HEPATIC FUNCTION PANEL
ALK PHOS: 69 U/L (ref 39–117)
ALT: 16 U/L (ref 0–35)
AST: 14 U/L (ref 0–37)
Albumin: 4.2 g/dL (ref 3.5–5.2)
BILIRUBIN DIRECT: 0 mg/dL (ref 0.0–0.3)
BILIRUBIN TOTAL: 0.6 mg/dL (ref 0.3–1.2)
TOTAL PROTEIN: 7.5 g/dL (ref 6.0–8.3)

## 2013-11-04 LAB — CBC WITH DIFFERENTIAL/PLATELET
BASOS PCT: 0.5 % (ref 0.0–3.0)
Basophils Absolute: 0 10*3/uL (ref 0.0–0.1)
EOS ABS: 0.2 10*3/uL (ref 0.0–0.7)
EOS PCT: 2.6 % (ref 0.0–5.0)
HEMATOCRIT: 37.9 % (ref 36.0–46.0)
Hemoglobin: 12.7 g/dL (ref 12.0–15.0)
LYMPHS ABS: 1 10*3/uL (ref 0.7–4.0)
Lymphocytes Relative: 13.7 % (ref 12.0–46.0)
MCHC: 33.6 g/dL (ref 30.0–36.0)
MCV: 85.4 fl (ref 78.0–100.0)
MONO ABS: 0.7 10*3/uL (ref 0.1–1.0)
Monocytes Relative: 9.9 % (ref 3.0–12.0)
NEUTROS ABS: 5.5 10*3/uL (ref 1.4–7.7)
Neutrophils Relative %: 73.3 % (ref 43.0–77.0)
Platelets: 309 10*3/uL (ref 150.0–400.0)
RBC: 4.44 Mil/uL (ref 3.87–5.11)
RDW: 14.9 % — ABNORMAL HIGH (ref 11.5–14.6)
WBC: 7.5 10*3/uL (ref 4.5–10.5)

## 2013-11-04 LAB — BASIC METABOLIC PANEL
BUN: 9 mg/dL (ref 6–23)
CHLORIDE: 100 meq/L (ref 96–112)
CO2: 26 meq/L (ref 19–32)
CREATININE: 0.6 mg/dL (ref 0.4–1.2)
Calcium: 9.2 mg/dL (ref 8.4–10.5)
GFR: 111.38 mL/min (ref >60.00)
Glucose, Bld: 160 mg/dL — ABNORMAL HIGH (ref 70–99)
Potassium: 3.8 mEq/L (ref 3.5–5.1)
SODIUM: 135 meq/L (ref 135–145)

## 2013-11-04 LAB — LIPID PANEL
CHOL/HDL RATIO: 4
CHOLESTEROL: 157 mg/dL (ref 0–200)
HDL: 41 mg/dL (ref >39.00)
LDL CALC: 95 mg/dL (ref 0–99)
Triglycerides: 105 mg/dL (ref 0.0–149.0)
VLDL: 21 mg/dL (ref 0.0–40.0)

## 2013-11-04 LAB — TSH: TSH: 0.21 u[IU]/mL — ABNORMAL LOW (ref 0.35–5.50)

## 2013-11-04 LAB — HEMOGLOBIN A1C: Hgb A1c MFr Bld: 7.8 % — ABNORMAL HIGH (ref 4.6–6.5)

## 2013-11-04 MED ORDER — CEPHALEXIN 500 MG PO CAPS
500.0000 mg | ORAL_CAPSULE | Freq: Two times a day (BID) | ORAL | Status: AC
Start: 2013-11-04 — End: 2013-11-14

## 2013-11-04 MED ORDER — CLONAZEPAM 0.5 MG PO TABS: 0.5000 mg | ORAL_TABLET | Freq: Every evening | ORAL | Status: DC | PRN

## 2013-11-04 NOTE — Patient Instructions (Signed)
Follow up in 3-4 months to recheck diabetes Call and schedule your mammo at your convenience: [phone] [facility] notify you of your lab results and make any changes if needed Call and schedule follow up w/ oncology Call with any questions or concerns Happy New Year!

## 2013-11-04 NOTE — Progress Notes (Signed)
   Subjective:    Patient ID: Marguerette Sheller, female    DOB: [birth year], 73 y.o.   MRN: [id number]  HPI Here today for CPE.  Risk Factors: HTN- chronic problem, on Lisinopril HCTZ, metoprolol.  Denies CP, SOB, HAs, visual changes. Hyperlipidemia- chronic problem, on Pravastatin.  Denies abd pain, N/V, myalgias DM- chronic problem, on Amaryl and Metformin.  Not checking CBGs.  Rare symptomatic lows.  No N/V/D.  No numbness/tingling of hands/feet.  UTD on eye exam- Oct 2014.   L ring finger- triggering, not interested in treatment at this time Vit D deficiency- chronic problem, check labs.  Replete prn. Physical Activity: very limited Fall Risk: low Depression: no current sxs Hearing: normal to conversational tones and whispered voice at 6 ft ADL's: independent Cognitive: normal linear thought process, memory and attention intact Home Safety: safe at home Height, Weight, BMI, Visual Acuity: see vitals, vision corrected to 20/20 w/ glasses Counseling: colonoscopy- 'i don't really want to get one', has never had one.  Due for mammo.  UTD on bone density.  No need for pap. Labs Ordered: See A&P Care Plan: See A&P    Review of Systems Patient reports no vision/ hearing changes, adenopathy,fever, weight change,  persistant/recurrent hoarseness , swallowing issues, chest pain, palpitations, edema, persistant/recurrent cough, hemoptysis, dyspnea (rest/exertional/paroxysmal nocturnal), gastrointestinal bleeding (melena, rectal bleeding), abdominal pain, significant heartburn, bowel changes, GU symptoms (dysuria, hematuria, incontinence), Gyn symptoms (abnormal  bleeding, pain),  syncope, focal weakness, memory loss, numbness & tingling, hair/nail changes, abnormal bruising or bleeding, anxiety, or depression.  L lower leg cellulitis w/ central bite     Objective:   Physical Exam General Appearance:    Alert, cooperative, no distress, appears stated age  Head:    Normocephalic, without obvious  abnormality, atraumatic  Eyes:    PERRL, conjunctiva/corneas clear, EOM's intact, fundi    benign, both eyes  Ears:    Normal TM's and external ear canals, both ears  Nose:   Nares normal, septum midline, mucosa normal, no drainage    or sinus tenderness  Throat:   Lips, mucosa, and tongue normal; teeth and gums normal  Neck:   Supple, symmetrical, trachea midline, no adenopathy;    Thyroid: no enlargement/tenderness/nodules  Back:     Symmetric, no curvature, ROM normal, no CVA tenderness  Lungs:     Clear to auscultation bilaterally, respirations unlabored  Chest Wall:    No tenderness or deformity   Heart:    Regular rate and rhythm, S1 and S2 normal, no murmur, rub   or gallop  Breast Exam:    Deferred to upcoming mammo at pt's request  Abdomen:     Soft, non-tender, bowel sounds active all four quadrants,    no masses, no organomegaly  Genitalia:    Deferred  Rectal:    Extremities:   Extremities normal, atraumatic, no cyanosis or edema  Pulses:   2+ and symmetric all extremities  Skin:   L lower leg w/ bug bite and surrounding cellulitis  Lymph nodes:   Cervical, supraclavicular, and axillary nodes normal  Neurologic:   CNII-XII intact, normal strength, sensation and reflexes    throughout          Assessment & Plan:

## 2013-11-06 ENCOUNTER — Ambulatory Visit: Payer: Medicare Other

## 2013-11-06 DIAGNOSIS — R6889 Other general symptoms and signs: Secondary | ICD-10-CM

## 2013-11-06 DIAGNOSIS — E039 Hypothyroidism, unspecified: Secondary | ICD-10-CM

## 2013-11-06 LAB — VITAMIN D 1,25 DIHYDROXY
VITAMIN D3 1, 25 (OH): 56 pg/mL
Vitamin D 1, 25 (OH)2 Total: 56 pg/mL (ref 18–72)
Vitamin D2 1, 25 (OH)2: <8 pg/mL

## 2013-11-07 ENCOUNTER — Encounter: Payer: Self-pay | Admitting: General Practice

## 2013-11-07 LAB — T3, FREE: T3, Free: 3.2 pg/mL (ref 2.3–4.2)

## 2013-11-07 LAB — T4, FREE: Free T4: 1.06 ng/dL (ref 0.60–1.60)

## 2013-11-21 ENCOUNTER — Telehealth: Payer: Self-pay | Admitting: Hematology & Oncology

## 2013-11-21 ENCOUNTER — Other Ambulatory Visit: Payer: Self-pay

## 2013-11-21 ENCOUNTER — Other Ambulatory Visit: Payer: Self-pay | Admitting: *Deleted

## 2013-11-21 DIAGNOSIS — Z1231 Encounter for screening mammogram for malignant neoplasm of breast: Secondary | ICD-10-CM

## 2013-11-21 DIAGNOSIS — Z9011 Acquired absence of right breast and nipple: Secondary | ICD-10-CM

## 2013-11-21 MED ORDER — LETROZOLE 2.5 MG PO TABS: 2.5000 mg | ORAL_TABLET | Freq: Every day | ORAL | Status: DC

## 2013-11-21 NOTE — Telephone Encounter (Signed)
Patient called wanting her Femara prescription refilled. Patient cancelled her last appt and hasnt been seen in this office for about a year.  Told patient she would need to make appt with dr. Marin Olp and keep it for her benefit.  Prescription sent to pharmacy.

## 2013-11-21 NOTE — Telephone Encounter (Signed)
Per RN schedule first available. Pt aware of 2-3 appointment

## 2013-11-21 NOTE — Telephone Encounter (Signed)
Son called pt is in the background talking also. They want a prescription refill "for anti cancer drug" and make MD appointment. Pt was here in 2013. I transferred call to RN to triage.

## 2013-11-24 NOTE — Assessment & Plan Note (Signed)
Chronic problem.  Again stressed importance of healthy diet due to her limited ability to exercise.  Will follow.

## 2013-11-24 NOTE — Assessment & Plan Note (Signed)
Chronic problem.  Not checking CBGs regularly.  UTD on eye exam.  Currently asymptomatic.  On ACE for renal protection.  Check labs.  Adjust meds prn

## 2013-11-24 NOTE — Assessment & Plan Note (Signed)
Chronic problem.  Tolerating statin w/o difficulty.  Check labs.  Adjust meds prn  

## 2013-11-24 NOTE — Assessment & Plan Note (Signed)
Check labs.  Replete prn. 

## 2013-11-24 NOTE — Assessment & Plan Note (Signed)
New.  Start abx.  Reviewed supportive care and red flags that should prompt return.  Pt expressed understanding and is in agreement w/ plan.  

## 2013-11-24 NOTE — Assessment & Plan Note (Signed)
Chronic problem.  Adequate control, asymptomatic.  Check labs.  No anticipated changes.

## 2013-11-24 NOTE — Assessment & Plan Note (Signed)
Pt's PE WNL w/ exception of L lower leg cellulitis and obesity.  Check labs.  Refer for mammo.  Pt refusing colonoscopy.

## 2013-12-02 ENCOUNTER — Telehealth: Payer: Self-pay | Admitting: Hematology & Oncology

## 2013-12-02 ENCOUNTER — Other Ambulatory Visit: Payer: Self-pay | Admitting: Oncology

## 2013-12-02 NOTE — Telephone Encounter (Signed)
Pt moved 2-3 to 3-4

## 2013-12-03 ENCOUNTER — Other Ambulatory Visit: Payer: Medicare Other | Admitting: Lab

## 2013-12-03 ENCOUNTER — Ambulatory Visit: Payer: Medicare Other | Admitting: Hematology & Oncology

## 2013-12-17 ENCOUNTER — Ambulatory Visit: Payer: Medicare Other

## 2013-12-27 ENCOUNTER — Ambulatory Visit
Admission: RE | Admit: 2013-12-27 | Discharge: 2013-12-27 | Disposition: A | Discharge status: Home / Self Care | Payer: Medicare Other | Source: Ambulatory Visit

## 2013-12-27 DIAGNOSIS — Z1231 Encounter for screening mammogram for malignant neoplasm of breast: Secondary | ICD-10-CM

## 2013-12-27 DIAGNOSIS — Z9011 Acquired absence of right breast and nipple: Secondary | ICD-10-CM

## 2014-01-01 ENCOUNTER — Other Ambulatory Visit: Payer: Medicare Other | Admitting: Lab

## 2014-01-01 ENCOUNTER — Encounter: Payer: Self-pay | Admitting: Hematology & Oncology

## 2014-01-01 ENCOUNTER — Ambulatory Visit (HOSPITAL_BASED_OUTPATIENT_CLINIC_OR_DEPARTMENT_OTHER): Payer: Medicare Other | Admitting: Hematology & Oncology

## 2014-01-01 VITALS — BP 175/50 | HR 79 | Temp 98.1°F | Resp 14 | Ht 64.0 in | Wt 209.0 lb

## 2014-01-01 DIAGNOSIS — C50919 Malignant neoplasm of unspecified site of unspecified female breast: Secondary | ICD-10-CM | POA: Diagnosis not present

## 2014-01-01 DIAGNOSIS — F17200 Nicotine dependence, unspecified, uncomplicated: Secondary | ICD-10-CM | POA: Diagnosis not present

## 2014-01-01 DIAGNOSIS — C773 Secondary and unspecified malignant neoplasm of axilla and upper limb lymph nodes: Secondary | ICD-10-CM

## 2014-01-01 DIAGNOSIS — M810 Age-related osteoporosis without current pathological fracture: Secondary | ICD-10-CM

## 2014-01-01 DIAGNOSIS — Z87891 Personal history of nicotine dependence: Secondary | ICD-10-CM

## 2014-01-01 NOTE — Patient Instructions (Signed)
You Can Quit Smoking If you are ready to quit smoking or are thinking about it, congratulations! You have chosen to help yourself be healthier and live longer! There are lots of different ways to quit smoking. Nicotine gum, nicotine patches, a nicotine inhaler, or nicotine nasal spray can help with physical craving. Hypnosis, support groups, and medicines help break the habit of smoking. TIPS TO GET OFF AND STAY OFF CIGARETTES  Learn to predict your moods. Do not let a bad situation be your excuse to have a cigarette. Some situations in your life might tempt you to have a cigarette.  Ask friends and co-workers not to smoke around you.  Make your home smoke-free.  Never have "just one" cigarette. It leads to wanting another and another. Remind yourself of your decision to quit.  On a card, make a list of your reasons for not smoking. Read it at least the same number of times a day as you have a cigarette. Tell yourself everyday, "I do not want to smoke. I choose not to smoke."  Ask someone at home or work to help you with your plan to quit smoking.  Have something planned after you eat or have a cup of coffee. Take a walk or get other exercise to perk you up. This will help to keep you from overeating.  Try a relaxation exercise to calm you down and decrease your stress. Remember, you may be tense and nervous the first two weeks after you quit. This will pass.  Find new activities to keep your hands busy. Play with a pen, coin, or rubber band. Doodle or draw things on paper.  Brush your teeth right after eating. This will help cut down the craving for the taste of tobacco after meals. You can try mouthwash too.  Try gum, breath mints, or diet candy to keep something in your mouth. IF YOU SMOKE AND WANT TO QUIT:  Do not stock up on cigarettes. Never buy a carton. Wait until one pack is finished before you buy another.  Never carry cigarettes with you at work or at home.  Keep cigarettes  as far away from you as possible. Leave them with someone else.  Never carry matches or a lighter with you.  Ask yourself, "Do I need this cigarette or is this just a reflex?"  Bet with someone that you can quit. Put cigarette money in a piggy bank every morning. If you smoke, you give up the money. If you do not smoke, by the end of the week, you keep the money.  Keep trying. It takes 21 days to change a habit!  Talk to your doctor about using medicines to help you quit. These include nicotine replacement gum, lozenges, or skin patches. Document Released: 08/13/2009 Document Revised: 01/09/2012 Document Reviewed: 08/13/2009 [employer] Patient Information 2014 [employer].  

## 2014-01-02 NOTE — Progress Notes (Signed)
  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 We last saw her back in September of 2013. Since then, she didn't come back up to [place] where her family is from. She now is back in [place]. She needs followup. She is doing okay. Is on Femara. There's no bony pain.  She had a mammogram recently this was okay.  Rapped site is okay. No change in bowel bladder habits. No rashes. No leg swelling. No headache. No rotative medications. No bleeding. No cough.  PHYSICAL EXAMINATION:  Well-developed and well-nourished white female Vital signs show a temperature of 98.1. Blood pressure 175/50. Temperature 98.1. Weight 209 pounds. Pulse 79. No adenopathy in the neck. Lungs clear. Cardiac exam regular in rhythm. Was exam the left breast no masses or edema or erythema. No left axillary adenopathy. Right chest wall shows well healed mastectomy. The right chest wall nodules noted. Back exam no tenderness. No kyphosis. Extremities no lymphedema. Skin no rashes.   LABORATORY STUDIES:  Labs were done earlier   IMPRESSION:  73 year old white female with stage IIIc breast cancer of a fibrosed. She had 11 positive lymph nodes. The tumor was ER positive and HER-2 negative. She is on Femara. She got chemotherapy. This was a thin [place]. She finished chemotherapy in and radiation therapy in 2011.    She needs Femara long-term in my mind. She is a high-risk of recurrence.    She is still smoking. She did a chest x-ray.        We'll see her back in 6 months.   Volanda Napoleon, MD 01/02/2014

## 2014-02-09 ENCOUNTER — Other Ambulatory Visit: Payer: Self-pay | Admitting: Family Medicine

## 2014-02-10 NOTE — Telephone Encounter (Signed)
Med filled.  

## 2014-02-13 ENCOUNTER — Ambulatory Visit (INDEPENDENT_AMBULATORY_CARE_PROVIDER_SITE_OTHER): Payer: Medicare Other | Admitting: Family Medicine

## 2014-02-13 ENCOUNTER — Encounter: Payer: Self-pay | Admitting: Family Medicine

## 2014-02-13 ENCOUNTER — Encounter: Payer: Self-pay | Admitting: General Practice

## 2014-02-13 ENCOUNTER — Other Ambulatory Visit: Payer: Self-pay | Admitting: General Practice

## 2014-02-13 VITALS — BP 142/76 | HR 67 | Temp 98.4°F | Resp 16 | Wt 209.0 lb

## 2014-02-13 DIAGNOSIS — E785 Hyperlipidemia, unspecified: Secondary | ICD-10-CM | POA: Diagnosis not present

## 2014-02-13 DIAGNOSIS — E119 Type 2 diabetes mellitus without complications: Secondary | ICD-10-CM

## 2014-02-13 LAB — BASIC METABOLIC PANEL
BUN: 10 mg/dL (ref 6–23)
CALCIUM: 9.2 mg/dL (ref 8.4–10.5)
CO2: 23 mEq/L (ref 19–32)
Chloride: 99 mEq/L (ref 96–112)
Creatinine, Ser: 0.7 mg/dL (ref 0.4–1.2)
GFR: 87.8 mL/min (ref >60.00)
Glucose, Bld: 206 mg/dL — ABNORMAL HIGH (ref 70–99)
Potassium: 4.7 mEq/L (ref 3.5–5.1)
Sodium: 131 mEq/L — ABNORMAL LOW (ref 135–145)

## 2014-02-13 LAB — HEMOGLOBIN A1C: HEMOGLOBIN A1C: 8.3 % — AB (ref 4.6–6.5)

## 2014-02-13 MED ORDER — SIMVASTATIN 40 MG PO TABS
40.0000 mg | ORAL_TABLET | Freq: Every day | ORAL | Status: DC
Start: 2014-02-13 — End: 2014-09-09

## 2014-02-13 MED ORDER — GLIMEPIRIDE 4 MG PO TABS
4.0000 mg | ORAL_TABLET | Freq: Every day | ORAL | Status: DC
Start: 2014-02-13 — End: 2014-09-23

## 2014-02-13 MED ORDER — CLONAZEPAM 0.5 MG PO TABS: 0.5000 mg | ORAL_TABLET | Freq: Every evening | ORAL | Status: DC | PRN

## 2014-02-13 NOTE — Progress Notes (Signed)
   Subjective:    Patient ID: Darlene Harrell, female    DOB: 1940/11/08, 73 y.o.   MRN: [id number]  HPI DM- chronic problem, on Metformin, Amaryl, on ACE for renal protection.  UTD on eye exam (Oct).  Not checking CBGs.  Denies symptomatic lows.  No N/V/D, CP, SOB, HAs, visual changes, edema, neuropathy.  Hyperlipidemia- chronic problem.  Pt reports Pravastatin is $40.  Asking to switch to Simvastatin b/c it's cheaper   Review of Systems For ROS see HPI     Objective:   Physical Exam  Vitals reviewed. Constitutional: She is oriented to person, place, and time. She appears well-developed and well-nourished. No distress.  HENT:  Head: Normocephalic and atraumatic.  Eyes: Conjunctivae and EOM are normal. Pupils are equal, round, and reactive to light.  Neck: Normal range of motion. Neck supple. No thyromegaly present.  Cardiovascular: Normal rate, regular rhythm, normal heart sounds and intact distal pulses.   No murmur heard. Pulmonary/Chest: Effort normal and breath sounds normal. No respiratory distress.  Abdominal: Soft. She exhibits no distension. There is no tenderness.  Musculoskeletal: She exhibits no edema.  Lymphadenopathy:    She has no cervical adenopathy.  Neurological: She is alert and oriented to person, place, and time.  Skin: Skin is warm and dry.  Psychiatric: She has a normal mood and affect. Her behavior is normal.          Assessment & Plan:

## 2014-02-13 NOTE — Patient Instructions (Signed)
Follow up in 3-4 months to recheck sugar and cholesterol We'll notify you of your lab results and make any changes if needed Keep up the good work! Happy Spring!!!

## 2014-02-13 NOTE — Assessment & Plan Note (Signed)
Chronic problem.  Hx of adequate control.  Not checking sugars.  UTD on eye exam.  Asymptomatic.  Check labs.  Adjust meds prn

## 2014-02-13 NOTE — Progress Notes (Signed)
Pre visit review using our clinic review tool, if applicable. No additional management support is needed unless otherwise documented below in the visit note. 

## 2014-02-13 NOTE — Assessment & Plan Note (Signed)
Chronic problem.  Was tolerating statin but needs to switch due to cost.  Script for Simvastatin sent.

## 2014-02-26 ENCOUNTER — Telehealth: Payer: Self-pay

## 2014-02-26 NOTE — Telephone Encounter (Signed)
Relevant patient education mailed to patient.  

## 2014-03-04 ENCOUNTER — Other Ambulatory Visit (INDEPENDENT_AMBULATORY_CARE_PROVIDER_SITE_OTHER): Payer: Medicare Other

## 2014-03-04 DIAGNOSIS — E871 Hypo-osmolality and hyponatremia: Secondary | ICD-10-CM

## 2014-03-04 LAB — BASIC METABOLIC PANEL
BUN: 14 mg/dL (ref 6–23)
CALCIUM: 9.4 mg/dL (ref 8.4–10.5)
CO2: 23 meq/L (ref 19–32)
Chloride: 100 mEq/L (ref 96–112)
Creatinine, Ser: 0.8 mg/dL (ref 0.4–1.2)
GFR: 81.07 mL/min (ref >60.00)
GLUCOSE: 306 mg/dL — AB (ref 70–99)
Potassium: 3.9 mEq/L (ref 3.5–5.1)
Sodium: 134 mEq/L — ABNORMAL LOW (ref 135–145)

## 2014-03-05 ENCOUNTER — Encounter: Payer: Self-pay | Admitting: General Practice

## 2014-03-05 DIAGNOSIS — Z79899 Other long term (current) drug therapy: Secondary | ICD-10-CM | POA: Diagnosis not present

## 2014-03-22 IMAGING — CR DG CHEST 2V
2 series · 2 of 2 positions shown · non-contrast
Comparison: None.

CLINICAL DATA: Cough, shortness of breath, weakness and fever.

CHEST - 2 VIEW

[w chest lat]
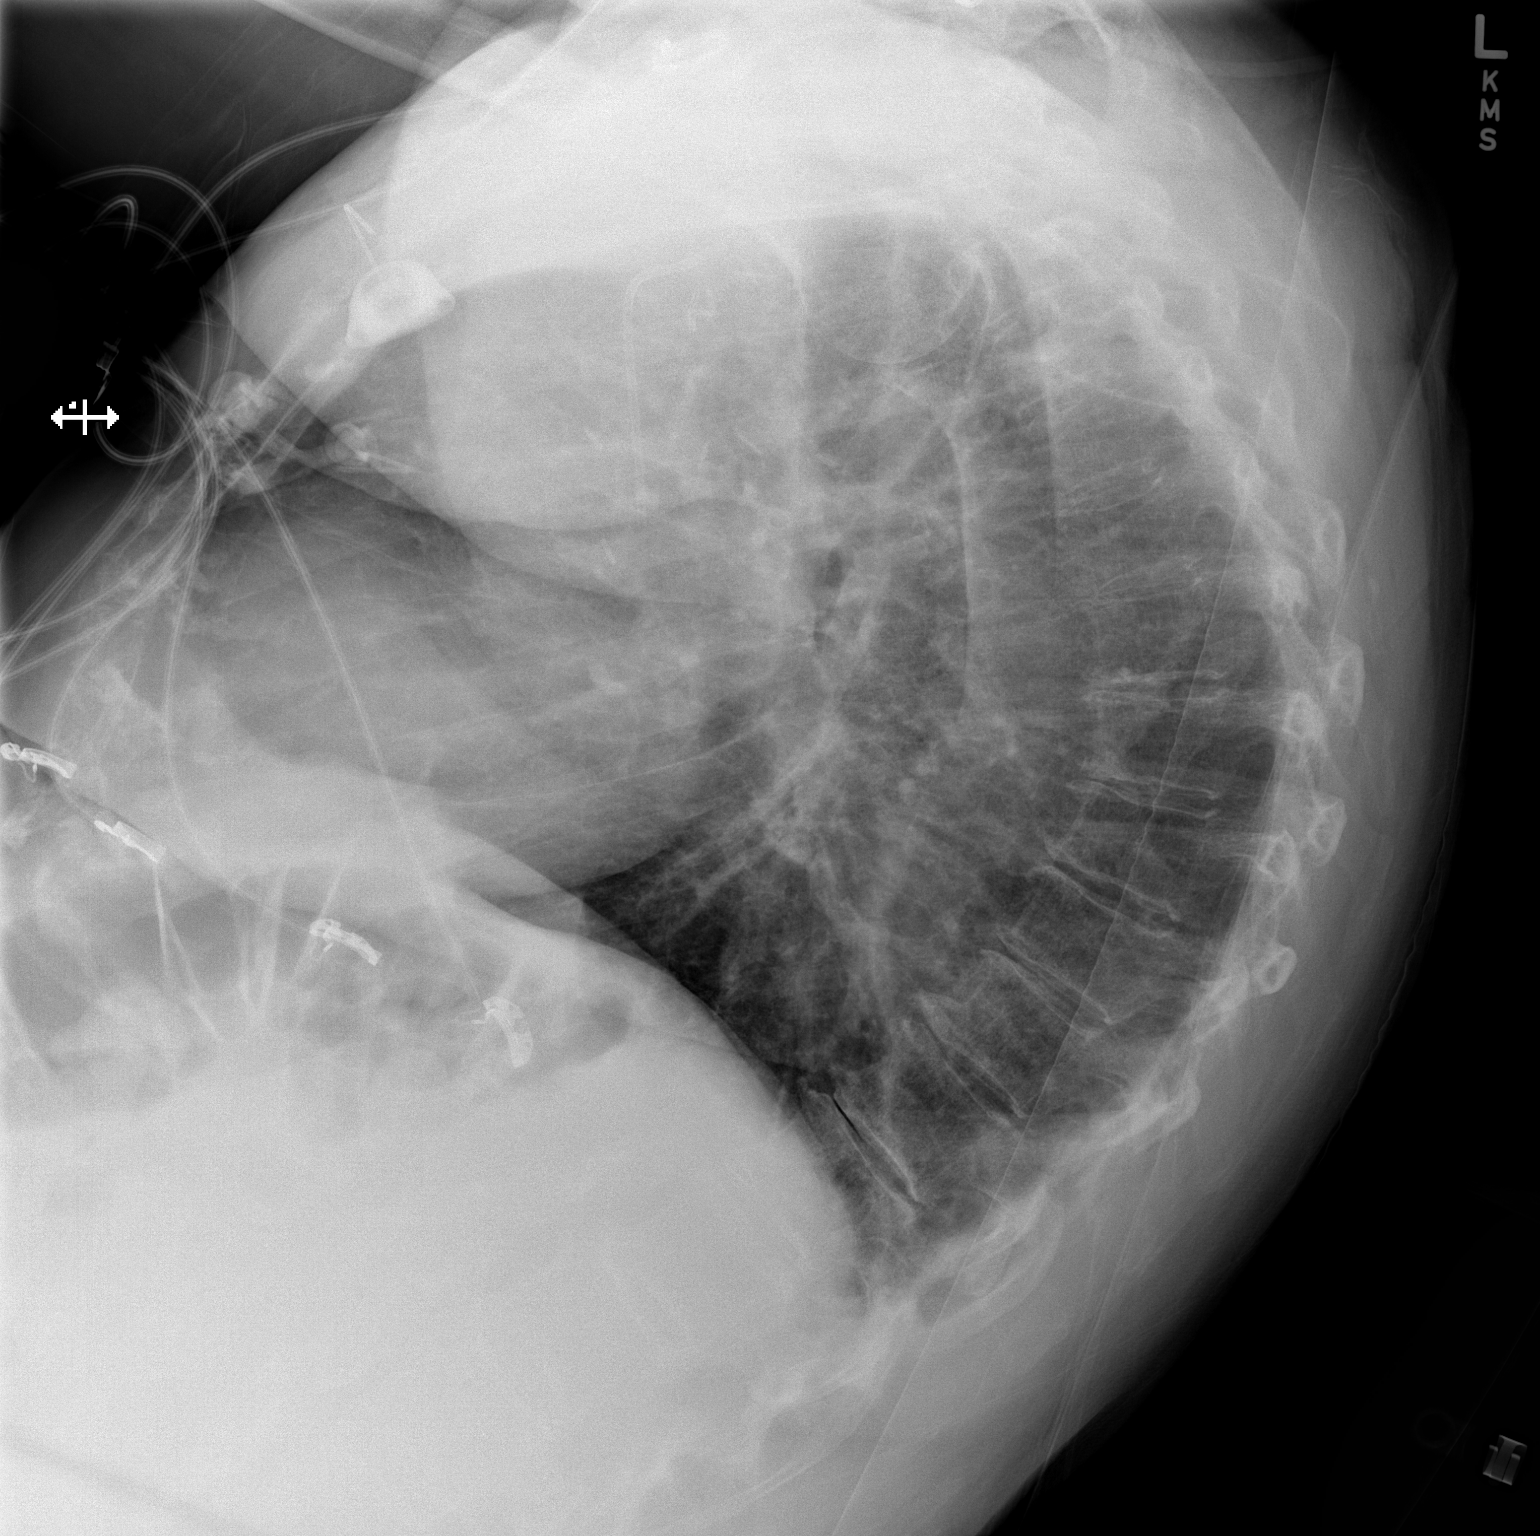

[x chest ap]
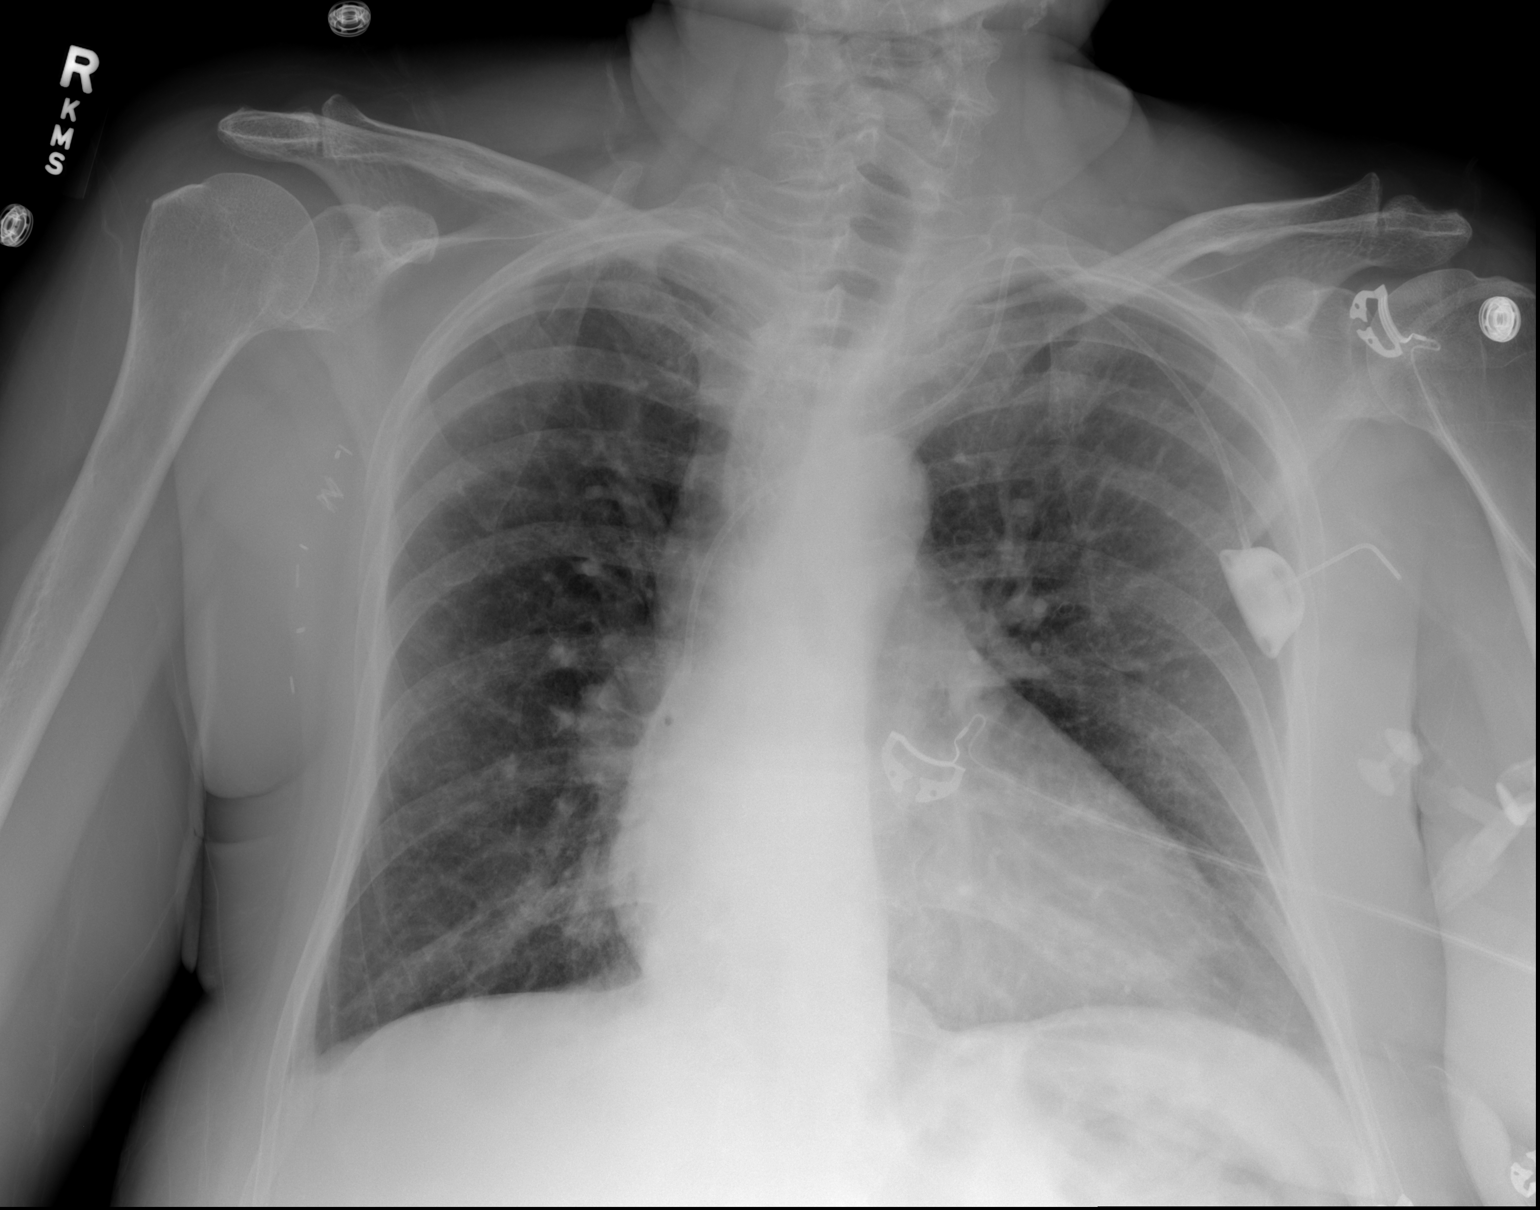

[2 of 2 positions shown; findings below may reference images not displayed]

FINDINGS: Trachea is midline.  Left IJ Port-A-Cath tip projects
over the SVC.  Heart size within normal limits.  Question very mild
interstitial prominence and indistinctness.  No definite pleural
fluid. Surgical clips in the right axilla.
IMPRESSION: Mild interstitial prominence may be due to emphysema.  Difficult to
exclude an acute process such as mild edema or viral pneumonia.

## 2014-03-27 ENCOUNTER — Encounter: Payer: Self-pay | Admitting: Family Medicine

## 2014-05-12 ENCOUNTER — Other Ambulatory Visit: Payer: Self-pay | Admitting: General Practice

## 2014-05-12 MED ORDER — METFORMIN HCL 1000 MG PO TABS: ORAL_TABLET | ORAL | Status: DC

## 2014-05-21 ENCOUNTER — Telehealth: Payer: Self-pay

## 2014-05-21 DIAGNOSIS — E119 Type 2 diabetes mellitus without complications: Secondary | ICD-10-CM

## 2014-05-21 NOTE — Telephone Encounter (Signed)
Diabetic bundle  Patient has an appt with Dr Birdie Riddle on 05-27-14. Will get BP rechecked with vitals and A1C can be rechecked.  Lab ordered

## 2014-05-22 ENCOUNTER — Other Ambulatory Visit: Payer: Self-pay | Admitting: *Deleted

## 2014-05-22 DIAGNOSIS — C50911 Malignant neoplasm of unspecified site of right female breast: Secondary | ICD-10-CM

## 2014-05-22 MED ORDER — LETROZOLE 2.5 MG PO TABS: 2.5000 mg | ORAL_TABLET | Freq: Every day | ORAL | Status: DC

## 2014-05-27 ENCOUNTER — Ambulatory Visit (INDEPENDENT_AMBULATORY_CARE_PROVIDER_SITE_OTHER): Payer: Medicare Other | Admitting: Family Medicine

## 2014-05-27 ENCOUNTER — Encounter: Payer: Self-pay | Admitting: Family Medicine

## 2014-05-27 VITALS — BP 132/84 | HR 76 | Temp 98.2°F | Resp 16 | Wt 206.2 lb

## 2014-05-27 DIAGNOSIS — B070 Plantar wart: Secondary | ICD-10-CM | POA: Diagnosis not present

## 2014-05-27 DIAGNOSIS — E785 Hyperlipidemia, unspecified: Secondary | ICD-10-CM

## 2014-05-27 DIAGNOSIS — I10 Essential (primary) hypertension: Secondary | ICD-10-CM

## 2014-05-27 DIAGNOSIS — E119 Type 2 diabetes mellitus without complications: Secondary | ICD-10-CM | POA: Diagnosis not present

## 2014-05-27 LAB — HEPATIC FUNCTION PANEL
ALK PHOS: 63 U/L (ref 39–117)
ALT: 17 U/L (ref 0–35)
AST: 17 U/L (ref 0–37)
Albumin: 3.8 g/dL (ref 3.5–5.2)
BILIRUBIN TOTAL: 0.4 mg/dL (ref 0.2–1.2)
Bilirubin, Direct: 0.1 mg/dL (ref 0.0–0.3)
Total Protein: 7.1 g/dL (ref 6.0–8.3)

## 2014-05-27 LAB — BASIC METABOLIC PANEL
BUN: 9 mg/dL (ref 6–23)
CO2: 26 mEq/L (ref 19–32)
Calcium: 9.2 mg/dL (ref 8.4–10.5)
Chloride: 101 mEq/L (ref 96–112)
Creatinine, Ser: 0.6 mg/dL (ref 0.4–1.2)
GFR: 111.2 mL/min (ref >60.00)
Glucose, Bld: 166 mg/dL — ABNORMAL HIGH (ref 70–99)
POTASSIUM: 3.6 meq/L (ref 3.5–5.1)
SODIUM: 133 meq/L — AB (ref 135–145)

## 2014-05-27 LAB — HEMOGLOBIN A1C: Hgb A1c MFr Bld: 8.1 % — ABNORMAL HIGH (ref 4.6–6.5)

## 2014-05-27 LAB — LIPID PANEL
CHOL/HDL RATIO: 3
Cholesterol: 117 mg/dL (ref 0–200)
HDL: 34.7 mg/dL — ABNORMAL LOW (ref >39.00)
LDL CALC: 57 mg/dL (ref 0–99)
NonHDL: 82.3
Triglycerides: 125 mg/dL (ref 0.0–149.0)
VLDL: 25 mg/dL (ref 0.0–40.0)

## 2014-05-27 NOTE — Assessment & Plan Note (Signed)
Chronic problem.  Adequate control.  Asymptomatic.  Check labs.  Adjust meds prn. 

## 2014-05-27 NOTE — Progress Notes (Signed)
   Subjective:    Patient ID: Darlene Harrell, female    DOB: 09/23/1941, 73 y.o.   MRN: [id number]  HPI DM- chronic problem, on Metformin and Glimepiride.  On ACE for renal protection.  UTD on eye exam (due in Oct).  Denies symptomatic lows w/ exception of last night.  Has lost 3 lbs.  Not checking CBGs.  No N/V/D.  No weakness/numbness hands/feet.  HTN- chronic problem, on Lisinopril HCTZ, metoprolol.  Well controlled today.  Denies CP, SOB, HAs, visual changes, edema.  Hyperlipidemia- chronic problem, on Simvastatin.  Denies abd pain, N/V, myalgias.   Review of Systems For ROS see HPI     Objective:   Physical Exam  Vitals reviewed. Constitutional: She is oriented to person, place, and time. She appears well-developed and well-nourished. No distress.  HENT:  Head: Normocephalic and atraumatic.  Eyes: Conjunctivae and EOM are normal. Pupils are equal, round, and reactive to light.  Neck: Normal range of motion. Neck supple. No thyromegaly present.  Cardiovascular: Normal rate, regular rhythm, normal heart sounds and intact distal pulses.   No murmur heard. Pulmonary/Chest: Effort normal and breath sounds normal. No respiratory distress.  Abdominal: Soft. She exhibits no distension. There is no tenderness.  Musculoskeletal: She exhibits no edema.  Lymphadenopathy:    She has no cervical adenopathy.  Neurological: She is alert and oriented to person, place, and time.  Skin: Skin is warm and dry.  Corn/plantar's wart on L foot  Psychiatric: She has a normal mood and affect. Her behavior is normal.          Assessment & Plan:

## 2014-05-27 NOTE — Assessment & Plan Note (Signed)
Chronic problem.  Tolerating statin w/o difficulty.  Check labs.  Adjust meds prn  

## 2014-05-27 NOTE — Patient Instructions (Signed)
Follow up in 3-4 months to recheck sugars We'll notify you of your lab results and make any changes if needed Keep up the good work!  You look great! We'll call you with your podiatry appt for the foot Call with any questions or concerns Enjoy the rest of your summer!!!

## 2014-05-27 NOTE — Progress Notes (Signed)
Pre visit review using our clinic review tool, if applicable. No additional management support is needed unless otherwise documented below in the visit note. 

## 2014-05-27 NOTE — Assessment & Plan Note (Signed)
New.  Refer to podiatry for management.

## 2014-05-27 NOTE — Assessment & Plan Note (Signed)
Chronic problem.  Taking medication as directed.  UTD on eye exam.  On ACE.  Refer to podiatry for wart/corn management on L foot.  Check labs.  Adjust meds prn

## 2014-05-28 ENCOUNTER — Encounter: Payer: Self-pay | Admitting: General Practice

## 2014-06-04 ENCOUNTER — Ambulatory Visit: Payer: Self-pay | Admitting: Podiatry

## 2014-07-04 ENCOUNTER — Ambulatory Visit (HOSPITAL_BASED_OUTPATIENT_CLINIC_OR_DEPARTMENT_OTHER)
Admission: RE | Admit: 2014-07-04 | Discharge: 2014-07-04 | Disposition: A | Discharge status: Home / Self Care | Payer: Medicare Other | Source: Ambulatory Visit | Attending: Hematology & Oncology | Admitting: Hematology & Oncology

## 2014-07-04 ENCOUNTER — Ambulatory Visit (HOSPITAL_BASED_OUTPATIENT_CLINIC_OR_DEPARTMENT_OTHER): Payer: Medicare Other | Admitting: Hematology & Oncology

## 2014-07-04 ENCOUNTER — Encounter: Payer: Self-pay | Admitting: Hematology & Oncology

## 2014-07-04 ENCOUNTER — Other Ambulatory Visit (HOSPITAL_BASED_OUTPATIENT_CLINIC_OR_DEPARTMENT_OTHER): Payer: Medicare Other | Admitting: Lab

## 2014-07-04 VITALS — BP 116/98 | HR 86 | Temp 98.2°F | Resp 16 | Ht 64.0 in | Wt 202.0 lb

## 2014-07-04 DIAGNOSIS — I498 Other specified cardiac arrhythmias: Secondary | ICD-10-CM

## 2014-07-04 DIAGNOSIS — Z170 Estrogen receptor positive status [ER+]: Secondary | ICD-10-CM | POA: Diagnosis not present

## 2014-07-04 DIAGNOSIS — Z853 Personal history of malignant neoplasm of breast: Secondary | ICD-10-CM | POA: Diagnosis not present

## 2014-07-04 DIAGNOSIS — Z87891 Personal history of nicotine dependence: Secondary | ICD-10-CM | POA: Diagnosis not present

## 2014-07-04 DIAGNOSIS — F17200 Nicotine dependence, unspecified, uncomplicated: Secondary | ICD-10-CM | POA: Diagnosis not present

## 2014-07-04 DIAGNOSIS — C50919 Malignant neoplasm of unspecified site of unspecified female breast: Secondary | ICD-10-CM

## 2014-07-04 DIAGNOSIS — Z79811 Long term (current) use of aromatase inhibitors: Secondary | ICD-10-CM

## 2014-07-04 DIAGNOSIS — C50911 Malignant neoplasm of unspecified site of right female breast: Secondary | ICD-10-CM

## 2014-07-04 LAB — CBC WITH DIFFERENTIAL (CANCER CENTER ONLY)
BASO#: 0 10*3/uL (ref 0.0–0.2)
BASO%: 0.2 % (ref 0.0–2.0)
EOS%: 1.5 % (ref 0.0–7.0)
Eosinophils Absolute: 0.2 10*3/uL (ref 0.0–0.5)
HEMATOCRIT: 39.8 % (ref 34.8–46.6)
HEMOGLOBIN: 13.2 g/dL (ref 11.6–15.9)
LYMPH#: 1.3 10*3/uL (ref 0.9–3.3)
LYMPH%: 13 % — AB (ref 14.0–48.0)
MCH: 28.9 pg (ref 26.0–34.0)
MCHC: 33.2 g/dL (ref 32.0–36.0)
MCV: 87 fL (ref 81–101)
MONO#: 1 10*3/uL — AB (ref 0.1–0.9)
MONO%: 9.4 % (ref 0.0–13.0)
NEUT#: 7.6 10*3/uL — ABNORMAL HIGH (ref 1.5–6.5)
NEUT%: 75.9 % (ref 39.6–80.0)
Platelets: 359 10*3/uL (ref 145–400)
RBC: 4.57 10*6/uL (ref 3.70–5.32)
RDW: 14.6 % (ref 11.1–15.7)
WBC: 10.1 10*3/uL — ABNORMAL HIGH (ref 3.9–10.0)

## 2014-07-04 LAB — CMP (CANCER CENTER ONLY)
ALT(SGPT): 17 U/L (ref 10–47)
AST: 13 U/L (ref 11–38)
Albumin: 3.5 g/dL (ref 3.3–5.5)
Alkaline Phosphatase: 63 U/L (ref 26–84)
BUN: 11 mg/dL (ref 7–22)
CALCIUM: 8.9 mg/dL (ref 8.0–10.3)
CHLORIDE: 98 meq/L (ref 98–108)
CO2: 24 mEq/L (ref 18–33)
Creat: 0.5 mg/dl — ABNORMAL LOW (ref 0.6–1.2)
GLUCOSE: 213 mg/dL — AB (ref 73–118)
Potassium: 3.8 mEq/L (ref 3.3–4.7)
Sodium: 136 mEq/L (ref 128–145)
Total Bilirubin: 0.7 mg/dl (ref 0.20–1.60)
Total Protein: 6.9 g/dL (ref 6.4–8.1)

## 2014-07-04 LAB — LACTATE DEHYDROGENASE: LDH: 140 U/L (ref 94–250)

## 2014-07-04 NOTE — Patient Instructions (Signed)
Smoking Cessation Quitting smoking is important to your health and has many advantages. However, it is not always easy to quit since nicotine is a very addictive drug. Oftentimes, people try 3 times or more before being able to quit. This document explains the best ways for you to prepare to quit smoking. Quitting takes hard work and a lot of effort, but you can do it. ADVANTAGES OF QUITTING SMOKING  You will live longer, feel better, and live better.  Your body will feel the impact of quitting smoking almost immediately.  Within 20 minutes, blood pressure decreases. Your pulse returns to its normal level.  After 8 hours, carbon monoxide levels in the blood return to normal. Your oxygen level increases.  After 24 hours, the chance of having a heart attack starts to decrease. Your breath, hair, and body stop smelling like smoke.  After 48 hours, damaged nerve endings begin to recover. Your sense of taste and smell improve.  After 72 hours, the body is virtually free of nicotine. Your bronchial tubes relax and breathing becomes easier.  After 2 to 12 weeks, lungs can hold more air. Exercise becomes easier and circulation improves.  The risk of having a heart attack, stroke, cancer, or lung disease is greatly reduced.  After 1 year, the risk of coronary heart disease is cut in half.  After 5 years, the risk of stroke falls to the same as a nonsmoker.  After 10 years, the risk of lung cancer is cut in half and the risk of other cancers decreases significantly.  After 15 years, the risk of coronary heart disease drops, usually to the level of a nonsmoker.  If you are pregnant, quitting smoking will improve your chances of having a healthy baby.  The people you live with, especially any children, will be healthier.  You will have extra money to spend on things other than cigarettes. QUESTIONS TO THINK ABOUT BEFORE ATTEMPTING TO QUIT You may want to talk about your answers with your  health care provider.  Why do you want to quit?  If you tried to quit in the past, what helped and what did not?  What will be the most difficult situations for you after you quit? How will you plan to handle them?  Who can help you through the tough times? Your family? Friends? A health care provider?  What pleasures do you get from smoking? What ways can you still get pleasure if you quit? Here are some questions to ask your health care provider:  How can you help me to be successful at quitting?  What medicine do you think would be best for me and how should I take it?  What should I do if I need more help?  What is smoking withdrawal like? How can I get information on withdrawal? GET READY  Set a quit date.  Change your environment by getting rid of all cigarettes, ashtrays, matches, and lighters in your home, car, or work. Do not let people smoke in your home.  Review your past attempts to quit. Think about what worked and what did not. GET SUPPORT AND ENCOURAGEMENT You have a better chance of being successful if you have help. You can get support in many ways.  Tell your family, friends, and coworkers that you are going to quit and need their support. Ask them not to smoke around you.  Get individual, group, or telephone counseling and support. Programs are available at local hospitals and health centers. Call   your local health department for information about programs in your area.  Spiritual beliefs and practices may help some smokers quit.  Download a "quit meter" on your computer to keep track of quit statistics, such as how long you have gone without smoking, cigarettes not smoked, and money saved.  Get a self-help book about quitting smoking and staying off tobacco. LEARN NEW SKILLS AND BEHAVIORS  Distract yourself from urges to smoke. Talk to someone, go for a walk, or occupy your time with a task.  Change your normal routine. Take a different route to work.  Drink tea instead of coffee. Eat breakfast in a different place.  Reduce your stress. Take a hot bath, exercise, or read a book.  Plan something enjoyable to do every day. Reward yourself for not smoking.  Explore interactive web-based programs that specialize in helping you quit. GET MEDICINE AND USE IT CORRECTLY Medicines can help you stop smoking and decrease the urge to smoke. Combining medicine with the above behavioral methods and support can greatly increase your chances of successfully quitting smoking.  Nicotine replacement therapy helps deliver nicotine to your body without the negative effects and risks of smoking. Nicotine replacement therapy includes nicotine gum, lozenges, inhalers, nasal sprays, and skin patches. Some may be available over-the-counter and others require a prescription.  Antidepressant medicine helps people abstain from smoking, but how this works is unknown. This medicine is available by prescription.  Nicotinic receptor partial agonist medicine simulates the effect of nicotine in your brain. This medicine is available by prescription. Ask your health care provider for advice about which medicines to use and how to use them based on your health history. Your health care provider will tell you what side effects to look out for if you choose to be on a medicine or therapy. Carefully read the information on the package. Do not use any other product containing nicotine while using a nicotine replacement product.  RELAPSE OR DIFFICULT SITUATIONS Most relapses occur within the first 3 months after quitting. Do not be discouraged if you start smoking again. Remember, most people try several times before finally quitting. You may have symptoms of withdrawal because your body is used to nicotine. You may crave cigarettes, be irritable, feel very hungry, cough often, get headaches, or have difficulty concentrating. The withdrawal symptoms are only temporary. They are strongest  when you first quit, but they will go away within 10-14 days. To reduce the chances of relapse, try to:  Avoid drinking alcohol. Drinking lowers your chances of successfully quitting.  Reduce the amount of caffeine you consume. Once you quit smoking, the amount of caffeine in your body increases and can give you symptoms, such as a rapid heartbeat, sweating, and anxiety.  Avoid smokers because they can make you want to smoke.  Do not let weight gain distract you. Many smokers will gain weight when they quit, usually less than 10 pounds. Eat a healthy diet and stay active. You can always lose the weight gained after you quit.  Find ways to improve your mood other than smoking. FOR MORE INFORMATION  www.smokefree.gov  Document Released: 10/11/2001 Document Revised: 03/03/2014 Document Reviewed: 01/26/2012 [employer] Patient Information 2015 [employer]. This information is not intended to replace advice given to you by your health care provider. Make sure you discuss any questions you have with your health care provider.  

## 2014-07-09 ENCOUNTER — Telehealth: Payer: Self-pay | Admitting: Hematology & Oncology

## 2014-07-09 ENCOUNTER — Other Ambulatory Visit: Payer: Self-pay | Admitting: Family Medicine

## 2014-07-09 DIAGNOSIS — R9431 Abnormal electrocardiogram [ECG] [EKG]: Secondary | ICD-10-CM

## 2014-07-09 DIAGNOSIS — I4891 Unspecified atrial fibrillation: Secondary | ICD-10-CM

## 2014-07-09 NOTE — Progress Notes (Signed)
Hematology and Oncology Follow Up Visit  Darlene Harrell [id number] 17-Sep-1941 73 y.o. 07/09/2014   Principle Diagnosis:   Stage IIIc (T3 N2M0) ductal carcinoma of the right breast-ER positive/HER-2 negative  Current Therapy:    Femara 2.5 mg by mouth daily     Interim History:  Ms.  Harrell is in for followup. Last saw her back in March. Since then, she doing pretty well. She was in [place] for vacation. She is from [place]. She really wants to move back. She is thinking about this seriously. Para she's had some shortness of breath. There's been some fatigue. She's had some chest discomfort.  She's had no problems with her diabetes. She says her diabetes has been under pretty good control. She's had no bleeding. He's had no diarrhea. She's had no change in bowel bladder habits. She's had no rashes.  Her last mammogram was done back in February. There is no ocular K. on the mammogram.  Her appetite has been okay.  Overall, her performance status is ECOG 1  Medications: Current outpatient prescriptions:aspirin 325 MG tablet, Take 325 mg by mouth daily., Disp: , Rfl: ;  Cholecalciferol (VITAMIN D) 2000 UNITS tablet, Take 2,000 Units by mouth daily., Disp: , Rfl: ;  clonazePAM (KLONOPIN) 0.5 MG tablet, Take 1 tablet (0.5 mg total) by mouth at bedtime as needed., Disp: 90 tablet, Rfl: 1;  glimepiride (AMARYL) 4 MG tablet, Take 1 tablet (4 mg total) by mouth daily before breakfast., Disp: 90 tablet, Rfl: 1 letrozole (FEMARA) 2.5 MG tablet, Take 1 tablet (2.5 mg total) by mouth daily., Disp: 180 tablet, Rfl: 0;  lisinopril-hydrochlorothiazide (ZESTORETIC) 20-12.5 MG per tablet, Take 1 tablet by mouth daily., Disp: 90 tablet, Rfl: 3;  metFORMIN (GLUCOPHAGE) 1000 MG tablet, TAKE ONE TABLET BY MOUTH TWICE DAILY WITH  A  MEAL, Disp: 180 tablet, Rfl: 0 metoprolol tartrate (LOPRESSOR) 25 MG tablet, Take 1 tablet (25 mg total) by mouth 2 (two) times daily., Disp: 180 tablet, Rfl: 3;  NON FORMULARY, PT  STATES NO MEDICATIONS HAVE CHANGED SINCE [phone], Disp: , Rfl: ;  simvastatin (ZOCOR) 40 MG tablet, Take 1 tablet (40 mg total) by mouth at bedtime., Disp: 30 tablet, Rfl: 6  Allergies: No Known Allergies  Past Medical History, Surgical history, Social history, and Family History were reviewed and updated.  Review of Systems: As above  Physical Exam:  height is _0  (1.626 m) and weight is 202 lb (91.627 kg). Her oral temperature is 98.2 F (36.8 C). Her blood pressure is 116/98 and her pulse is 86. Her respiration is 16.   Well-developed well-nourished white female. Head and neck exam shows no ocular or oral lesions. She has no scleral icterus. She is no oral lesion. There is no adenopathy in the neck. Lungs are clear bilaterally. Cardiac exam is tachycardic and irregular. She has no murmurs rubs or bruits. Abdomen is soft. She has good bowel sounds. There is no fluid wave. There is no palpable liver or spleen tip. Exam shows no tenderness over the spine, ribs or hips. Her breast exam shows left breast with no masses, or erythema. There is no left axillary adenopathy. Right chest wall shows well-healed mastectomy. No right chest wall nodules are noted. There is no right axillary adenopathy. Back exam no tenderness over the spine, ribs or hips. Extremities shows no clubbing, cyanosis or edema. Neurological exam shows no focal neurological deficits. Skin exam no rashes, ecchymosis or petechia. Lab Results  Component Value Date   WBC 10.1*  07/04/2014   HGB 13.2 07/04/2014   HCT 39.8 07/04/2014   MCV 87 07/04/2014   PLT 359 07/04/2014     Chemistry      Component Value Date/Time   NA 136 07/04/2014 1034   NA 133* 05/27/2014 0955   K 3.8 07/04/2014 1034   K 3.6 05/27/2014 0955   CL 98 07/04/2014 1034   CL 101 05/27/2014 0955   CO2 24 07/04/2014 1034   CO2 26 05/27/2014 0955   BUN 11 07/04/2014 1034   BUN 9 05/27/2014 0955   CREATININE 0.5* 07/04/2014 1034   CREATININE 0.6 05/27/2014 0955      Component Value  Date/Time   CALCIUM 8.9 07/04/2014 1034   CALCIUM 9.2 05/27/2014 0955   ALKPHOS 63 07/04/2014 1034   ALKPHOS 63 05/27/2014 0955   AST 13 07/04/2014 1034   AST 17 05/27/2014 0955   ALT 17 07/04/2014 1034   ALT 17 05/27/2014 0955   BILITOT 0.70 07/04/2014 1034   BILITOT 0.4 05/27/2014 0955       We did an EKG on her. This showed sinus tachycardia with junctional complexes. She had a left bundle branch block.  She has seen cardiology before. This was probably a couple years ago.    Impression and Plan: Darlene Harrell is a 73 year old female. She has a history of stage IIIc ductal carcinoma the right breast. She had 11 positive lymph nodes. She is ER positive. She is on Femara. I would keep her on Femara for life.  I mushrooms go with her heart. She would not that symptomatic. Again she has had atrial fibrillation in the past.  We will have to let her  primarydoctor know what is going on. She may need to be referred back to cardiology.  Again, she wants to move back to [place]. If she wants to move up there, I certainly would have no problems with this.  We will plan to get back to see [place] in another 6 months.  Volanda Napoleon, MD 9/9/20157:10 AM

## 2014-07-09 NOTE — Telephone Encounter (Signed)
Per Md order for patient to be sch in 77months.  Patient's apt was sch for 01/01/14 and calendar was mailed to patient's home

## 2014-08-08 ENCOUNTER — Ambulatory Visit: Payer: Medicare Other | Admitting: Cardiovascular Disease

## 2014-08-13 ENCOUNTER — Telehealth: Payer: Self-pay | Admitting: Family Medicine

## 2014-08-13 ENCOUNTER — Telehealth: Payer: Self-pay | Admitting: *Deleted

## 2014-08-13 ENCOUNTER — Other Ambulatory Visit: Payer: Self-pay

## 2014-08-13 DIAGNOSIS — I10 Essential (primary) hypertension: Secondary | ICD-10-CM

## 2014-08-13 MED ORDER — METFORMIN HCL 1000 MG PO TABS: ORAL_TABLET | ORAL | Status: DC

## 2014-08-13 MED ORDER — CLONAZEPAM 0.5 MG PO TABS: 0.5000 mg | ORAL_TABLET | Freq: Every evening | ORAL | Status: DC | PRN

## 2014-08-13 MED ORDER — LISINOPRIL-HYDROCHLOROTHIAZIDE 20-12.5 MG PO TABS: 1.0000 | ORAL_TABLET | Freq: Every day | ORAL | Status: DC

## 2014-08-13 NOTE — Telephone Encounter (Signed)
Med filled.  

## 2014-08-13 NOTE — Telephone Encounter (Signed)
Caller name: Jesse Relation to pt: self Call back number: [phone] [facility]: [facility] on [address]  Reason for call:   Patient is requesting a refill of metformin

## 2014-08-13 NOTE — Telephone Encounter (Signed)
Pt son called, and was hoping to pick up refill on   clonazePAM (KLONOPIN) 0.5 MG tablet   I could not find printed prescription, and he states the pharmacy does not have it.  Please advise.

## 2014-08-14 NOTE — Telephone Encounter (Signed)
This was recalled in this am.

## 2014-08-25 ENCOUNTER — Ambulatory Visit (INDEPENDENT_AMBULATORY_CARE_PROVIDER_SITE_OTHER): Payer: Medicare Other | Admitting: Family Medicine

## 2014-08-25 ENCOUNTER — Encounter: Payer: Self-pay | Admitting: Family Medicine

## 2014-08-25 VITALS — BP 122/80 | HR 100 | Temp 98.2°F | Resp 16 | Wt 207.2 lb

## 2014-08-25 DIAGNOSIS — Z23 Encounter for immunization: Secondary | ICD-10-CM | POA: Diagnosis not present

## 2014-08-25 DIAGNOSIS — E109 Type 1 diabetes mellitus without complications: Secondary | ICD-10-CM | POA: Diagnosis not present

## 2014-08-25 DIAGNOSIS — I480 Paroxysmal atrial fibrillation: Secondary | ICD-10-CM

## 2014-08-25 LAB — BASIC METABOLIC PANEL
BUN: 10 mg/dL (ref 6–23)
CO2: 24 mEq/L (ref 19–32)
Calcium: 9.2 mg/dL (ref 8.4–10.5)
Chloride: 101 mEq/L (ref 96–112)
Creatinine, Ser: 0.7 mg/dL (ref 0.4–1.2)
GFR: 86.24 mL/min (ref >60.00)
Glucose, Bld: 174 mg/dL — ABNORMAL HIGH (ref 70–99)
POTASSIUM: 4 meq/L (ref 3.5–5.1)
SODIUM: 132 meq/L — AB (ref 135–145)

## 2014-08-25 LAB — HEMOGLOBIN A1C: HEMOGLOBIN A1C: 7.9 % — AB (ref 4.6–6.5)

## 2014-08-25 LAB — TSH: TSH: 0.2 u[IU]/mL — ABNORMAL LOW (ref 0.35–4.50)

## 2014-08-25 NOTE — Patient Instructions (Signed)
Schedule your complete physical in 3-4 months We'll notify you of your lab results and make any changes if needed Schedule your eye exam STOP SMOKING!  You can do this! If you have chest pain, palpitations, or other concerns- please call! Follow up with cardiology as scheduled Call with any questions or concerns Happy [birth year] Birthday!

## 2014-08-25 NOTE — Progress Notes (Signed)
   Subjective:    Patient ID: Darlene Harrell, female    DOB: [birth year], 73 y.o.   MRN: [id number]  HPI DM- chronic problem, last A1C 8.1  On Metformin, Amaryl.  On ACE.  Not checking CBGs.  Denies symptomatic lows.  No CP, SOB above baseline, HAs, visual changes, edema, abd pain, N/V/D.  Due for eye exam.  No numbness/tingling of hands/feet.  Of note- pt saw Dr Marin Olp on 9/4 and he heard rapid and irregular heart beat.  EKG showed sinus tach w/ junctional complexes and LBBB.  Has appt w/ cards upcoming.  Denies current palpitations.  Review of Systems For ROS see HPI     Objective:   Physical Exam  Vitals reviewed. Constitutional: She is oriented to person, place, and time. She appears well-developed and well-nourished. No distress.  HENT:  Head: Normocephalic and atraumatic.  Eyes: Conjunctivae and EOM are normal. Pupils are equal, round, and reactive to light.  Neck: Normal range of motion. Neck supple. No thyromegaly present.  Cardiovascular: Normal heart sounds and intact distal pulses.   No murmur heard. Tachy but regular- pt declining EKG today  Pulmonary/Chest: Effort normal and breath sounds normal. No respiratory distress.  Abdominal: Soft. She exhibits no distension. There is no tenderness.  Musculoskeletal: She exhibits no edema.  Lymphadenopathy:    She has no cervical adenopathy.  Neurological: She is alert and oriented to person, place, and time.  Skin: Skin is warm and dry.  Psychiatric: She has a normal mood and affect. Her behavior is normal.          Assessment & Plan:

## 2014-08-25 NOTE — Progress Notes (Signed)
Pre visit review using our clinic review tool, if applicable. No additional management support is needed unless otherwise documented below in the visit note. 

## 2014-08-26 ENCOUNTER — Telehealth: Payer: Self-pay | Admitting: Family Medicine

## 2014-08-26 ENCOUNTER — Other Ambulatory Visit (INDEPENDENT_AMBULATORY_CARE_PROVIDER_SITE_OTHER): Payer: Medicare Other

## 2014-08-26 DIAGNOSIS — R946 Abnormal results of thyroid function studies: Secondary | ICD-10-CM

## 2014-08-26 DIAGNOSIS — R7989 Other specified abnormal findings of blood chemistry: Secondary | ICD-10-CM

## 2014-08-26 NOTE — Assessment & Plan Note (Signed)
Pt w/ hx of this.  HR is regular today in office.  Offered to do EKG to compare to the one done at Oncology- pt declined and reported she would f/u w/ cards as scheduled.

## 2014-08-26 NOTE — Telephone Encounter (Signed)
emmi mailed  °

## 2014-08-26 NOTE — Assessment & Plan Note (Signed)
Chronic problem.  Due for eye exam- encouraged her to schedule.  On ACE for renal protection.  Encouraged her to eat low carb diet and attempt to get regular exercise.  Check labs.  Adjust meds prn

## 2014-08-27 ENCOUNTER — Other Ambulatory Visit: Payer: Self-pay | Admitting: Family Medicine

## 2014-08-27 DIAGNOSIS — R7989 Other specified abnormal findings of blood chemistry: Secondary | ICD-10-CM

## 2014-08-27 LAB — T3, FREE: T3 FREE: 2.5 pg/mL (ref 2.3–4.2)

## 2014-08-27 LAB — T4, FREE: FREE T4: 1.2 ng/dL (ref 0.60–1.60)

## 2014-09-05 ENCOUNTER — Emergency Department (HOSPITAL_BASED_OUTPATIENT_CLINIC_OR_DEPARTMENT_OTHER): Payer: Medicare Other

## 2014-09-05 ENCOUNTER — Encounter: Payer: Self-pay | Admitting: Medical

## 2014-09-05 ENCOUNTER — Ambulatory Visit (INDEPENDENT_AMBULATORY_CARE_PROVIDER_SITE_OTHER): Payer: Medicare Other | Admitting: Medical

## 2014-09-05 ENCOUNTER — Encounter (HOSPITAL_BASED_OUTPATIENT_CLINIC_OR_DEPARTMENT_OTHER): Payer: Self-pay | Admitting: *Deleted

## 2014-09-05 ENCOUNTER — Encounter: Payer: Self-pay | Admitting: General Practice

## 2014-09-05 ENCOUNTER — Other Ambulatory Visit (INDEPENDENT_AMBULATORY_CARE_PROVIDER_SITE_OTHER): Payer: Medicare Other

## 2014-09-05 ENCOUNTER — Inpatient Hospital Stay (HOSPITAL_BASED_OUTPATIENT_CLINIC_OR_DEPARTMENT_OTHER)
Admission: EM | Admit: 2014-09-05 | Discharge: 2014-09-09 | DRG: 310 | Disposition: A | Discharge status: Home / Self Care | Payer: Medicare Other | Attending: Cardiology | Admitting: Cardiology

## 2014-09-05 VITALS — BP 118/87 | HR 132 | Temp 96.8°F | Ht 64.0 in | Wt 209.6 lb

## 2014-09-05 DIAGNOSIS — Z9010 Acquired absence of unspecified breast and nipple: Secondary | ICD-10-CM | POA: Diagnosis present

## 2014-09-05 DIAGNOSIS — R7989 Other specified abnormal findings of blood chemistry: Secondary | ICD-10-CM

## 2014-09-05 DIAGNOSIS — Z79811 Long term (current) use of aromatase inhibitors: Secondary | ICD-10-CM

## 2014-09-05 DIAGNOSIS — R000 Tachycardia, unspecified: Secondary | ICD-10-CM | POA: Diagnosis not present

## 2014-09-05 DIAGNOSIS — I341 Nonrheumatic mitral (valve) prolapse: Secondary | ICD-10-CM | POA: Diagnosis not present

## 2014-09-05 DIAGNOSIS — I10 Essential (primary) hypertension: Secondary | ICD-10-CM | POA: Diagnosis present

## 2014-09-05 DIAGNOSIS — F17210 Nicotine dependence, cigarettes, uncomplicated: Secondary | ICD-10-CM | POA: Diagnosis present

## 2014-09-05 DIAGNOSIS — I517 Cardiomegaly: Secondary | ICD-10-CM | POA: Diagnosis not present

## 2014-09-05 DIAGNOSIS — R0600 Dyspnea, unspecified: Secondary | ICD-10-CM | POA: Insufficient documentation

## 2014-09-05 DIAGNOSIS — C50919 Malignant neoplasm of unspecified site of unspecified female breast: Secondary | ICD-10-CM | POA: Diagnosis present

## 2014-09-05 DIAGNOSIS — Z6834 Body mass index (BMI) 34.0-34.9, adult: Secondary | ICD-10-CM | POA: Diagnosis not present

## 2014-09-05 DIAGNOSIS — Z7982 Long term (current) use of aspirin: Secondary | ICD-10-CM

## 2014-09-05 DIAGNOSIS — E119 Type 2 diabetes mellitus without complications: Secondary | ICD-10-CM | POA: Diagnosis present

## 2014-09-05 DIAGNOSIS — Z79899 Other long term (current) drug therapy: Secondary | ICD-10-CM | POA: Diagnosis not present

## 2014-09-05 DIAGNOSIS — I480 Paroxysmal atrial fibrillation: Principal | ICD-10-CM | POA: Diagnosis present

## 2014-09-05 DIAGNOSIS — E785 Hyperlipidemia, unspecified: Secondary | ICD-10-CM | POA: Diagnosis present

## 2014-09-05 DIAGNOSIS — J90 Pleural effusion, not elsewhere classified: Secondary | ICD-10-CM | POA: Diagnosis not present

## 2014-09-05 DIAGNOSIS — F329 Major depressive disorder, single episode, unspecified: Secondary | ICD-10-CM | POA: Diagnosis present

## 2014-09-05 DIAGNOSIS — R609 Edema, unspecified: Secondary | ICD-10-CM | POA: Diagnosis not present

## 2014-09-05 DIAGNOSIS — I4891 Unspecified atrial fibrillation: Secondary | ICD-10-CM

## 2014-09-05 DIAGNOSIS — Z9221 Personal history of antineoplastic chemotherapy: Secondary | ICD-10-CM

## 2014-09-05 DIAGNOSIS — I369 Nonrheumatic tricuspid valve disorder, unspecified: Secondary | ICD-10-CM | POA: Diagnosis not present

## 2014-09-05 DIAGNOSIS — J811 Chronic pulmonary edema: Secondary | ICD-10-CM | POA: Diagnosis not present

## 2014-09-05 DIAGNOSIS — R0602 Shortness of breath: Secondary | ICD-10-CM | POA: Diagnosis not present

## 2014-09-05 DIAGNOSIS — I519 Heart disease, unspecified: Secondary | ICD-10-CM | POA: Diagnosis present

## 2014-09-05 DIAGNOSIS — I5031 Acute diastolic (congestive) heart failure: Secondary | ICD-10-CM | POA: Diagnosis present

## 2014-09-05 HISTORY — DX: Heart disease, unspecified: I51.9

## 2014-09-05 LAB — PROTIME-INR
INR: 1.16 (ref 0.00–1.49)
Prothrombin Time: 14.8 seconds (ref 11.6–15.2)

## 2014-09-05 LAB — CBC WITH DIFFERENTIAL/PLATELET
BASOS PCT: 0 % (ref 0–1)
Basophils Absolute: 0 10*3/uL (ref 0.0–0.1)
Eosinophils Absolute: 0.1 10*3/uL (ref 0.0–0.7)
Eosinophils Relative: 1 % (ref 0–5)
HCT: 37.2 % (ref 36.0–46.0)
Hemoglobin: 12 g/dL (ref 12.0–15.0)
Lymphocytes Relative: 12 % (ref 12–46)
Lymphs Abs: 1.3 10*3/uL (ref 0.7–4.0)
MCH: 27.8 pg (ref 26.0–34.0)
MCHC: 32.3 g/dL (ref 30.0–36.0)
MCV: 86.3 fL (ref 78.0–100.0)
MONOS PCT: 11 % (ref 3–12)
Monocytes Absolute: 1.2 10*3/uL — ABNORMAL HIGH (ref 0.1–1.0)
Neutro Abs: 8.7 10*3/uL — ABNORMAL HIGH (ref 1.7–7.7)
Neutrophils Relative %: 76 % (ref 43–77)
Platelets: 407 10*3/uL — ABNORMAL HIGH (ref 150–400)
RBC: 4.31 MIL/uL (ref 3.87–5.11)
RDW: 14.6 % (ref 11.5–15.5)
WBC: 11.3 10*3/uL — ABNORMAL HIGH (ref 4.0–10.5)

## 2014-09-05 LAB — BASIC METABOLIC PANEL
BUN: 8 mg/dL (ref 6–23)
CALCIUM: 9.3 mg/dL (ref 8.4–10.5)
CO2: 18 mEq/L — ABNORMAL LOW (ref 19–32)
Chloride: 101 mEq/L (ref 96–112)
Creatinine, Ser: 0.7 mg/dL (ref 0.4–1.2)
GFR: 82.21 mL/min (ref >60.00)
GLUCOSE: 218 mg/dL — AB (ref 70–99)
Potassium: 4.1 mEq/L (ref 3.5–5.1)
SODIUM: 133 meq/L — AB (ref 135–145)

## 2014-09-05 LAB — COMPREHENSIVE METABOLIC PANEL
ALT: 93 U/L — ABNORMAL HIGH (ref 0–35)
ANION GAP: 16 — AB (ref 5–15)
AST: 47 U/L — AB (ref 0–37)
Albumin: 3.9 g/dL (ref 3.5–5.2)
Alkaline Phosphatase: 97 U/L (ref 39–117)
BILIRUBIN TOTAL: 0.7 mg/dL (ref 0.3–1.2)
BUN: 7 mg/dL (ref 6–23)
CO2: 22 mEq/L (ref 19–32)
Calcium: 9.5 mg/dL (ref 8.4–10.5)
Chloride: 96 mEq/L (ref 96–112)
Creatinine, Ser: 0.6 mg/dL (ref 0.50–1.10)
GFR calc Af Amer: >90 mL/min (ref >90)
GFR calc non Af Amer: 90 mL/min — ABNORMAL LOW (ref >90)
Glucose, Bld: 198 mg/dL — ABNORMAL HIGH (ref 70–99)
POTASSIUM: 4.3 meq/L (ref 3.7–5.3)
Sodium: 134 mEq/L — ABNORMAL LOW (ref 137–147)
Total Protein: 7.1 g/dL (ref 6.0–8.3)

## 2014-09-05 LAB — I-STAT VENOUS BLOOD GAS, ED
Acid-base deficit: 1 mmol/L (ref 0.0–2.0)
BICARBONATE: 23.8 meq/L (ref 20.0–24.0)
O2 Saturation: 82 %
PCO2 VEN: 39.6 mmHg — AB (ref 45.0–50.0)
PO2 VEN: 47 mmHg — AB (ref 30.0–45.0)
TCO2: 25 mmol/L (ref 0–100)
pH, Ven: 7.386 — ABNORMAL HIGH (ref 7.250–7.300)

## 2014-09-05 LAB — GLUCOSE, CAPILLARY: GLUCOSE-CAPILLARY: 190 mg/dL — AB (ref 70–99)

## 2014-09-05 LAB — TSH: TSH: 0.336 u[IU]/mL — ABNORMAL LOW (ref 0.350–4.500)

## 2014-09-05 LAB — MAGNESIUM: MAGNESIUM: 1.8 mg/dL (ref 1.5–2.5)

## 2014-09-05 LAB — APTT: APTT: 30 s (ref 24–37)

## 2014-09-05 LAB — TROPONIN I: Troponin I: <0.3 ng/mL (ref <0.30)

## 2014-09-05 MED ORDER — SODIUM CHLORIDE 0.9 % IJ SOLN
3.0000 mL | Freq: Two times a day (BID) | INTRAMUSCULAR | Status: DC
  Administered 2014-09-06 – 2014-09-08 (×4): 3 mL via INTRAVENOUS

## 2014-09-05 MED ORDER — DILTIAZEM HCL 25 MG/5ML IV SOLN
INTRAVENOUS | Status: AC
Start: 2014-09-05 — End: 2014-09-05
  Administered 2014-09-05: 100 mg via INTRAVENOUS
  Filled 2014-09-05: qty 20

## 2014-09-05 MED ORDER — CLONAZEPAM 0.5 MG PO TABS
0.5000 mg | ORAL_TABLET | Freq: Every evening | ORAL | Status: DC | PRN
  Administered 2014-09-06 – 2014-09-08 (×3): 0.5 mg via ORAL
  Filled 2014-09-05 (×3): qty 1

## 2014-09-05 MED ORDER — ONDANSETRON HCL 4 MG/2ML IJ SOLN: 4.0000 mg | Freq: Three times a day (TID) | INTRAMUSCULAR | Status: DC | PRN

## 2014-09-05 MED ORDER — DEXTROSE 5 % IV SOLN
5.0000 mg/h | Freq: Once | INTRAVENOUS | Status: AC
  Administered 2014-09-05: 5 mg/h via INTRAVENOUS

## 2014-09-05 MED ORDER — DILTIAZEM HCL 25 MG/5ML IV SOLN
INTRAVENOUS | Status: AC
  Filled 2014-09-05: qty 5

## 2014-09-05 MED ORDER — ASPIRIN 81 MG PO CHEW
324.0000 mg | CHEWABLE_TABLET | Freq: Once | ORAL | Status: AC
  Administered 2014-09-05: 243 mg via ORAL

## 2014-09-05 MED ORDER — INSULIN ASPART 100 UNIT/ML ~~LOC~~ SOLN
0.0000 [IU] | Freq: Three times a day (TID) | SUBCUTANEOUS | Status: DC
  Administered 2014-09-06: 3 [IU] via SUBCUTANEOUS
  Administered 2014-09-06: 1 [IU] via SUBCUTANEOUS
  Administered 2014-09-06 – 2014-09-07 (×2): 2 [IU] via SUBCUTANEOUS
  Administered 2014-09-07 (×2): 3 [IU] via SUBCUTANEOUS
  Administered 2014-09-08: 2 [IU] via SUBCUTANEOUS
  Administered 2014-09-08: 3 [IU] via SUBCUTANEOUS
  Administered 2014-09-09 (×2): 2 [IU] via SUBCUTANEOUS

## 2014-09-05 MED ORDER — ONDANSETRON HCL 4 MG/2ML IJ SOLN: 4.0000 mg | Freq: Four times a day (QID) | INTRAMUSCULAR | Status: DC | PRN

## 2014-09-05 MED ORDER — SODIUM CHLORIDE 0.9 % IV SOLN
250.0000 mL | INTRAVENOUS | Status: DC | PRN
  Administered 2014-09-09: 08:00:00 via INTRAVENOUS

## 2014-09-05 MED ORDER — VITAMIN D3 25 MCG (1000 UNIT) PO TABS
2000.0000 [IU] | ORAL_TABLET | Freq: Every day | ORAL | Status: DC
  Administered 2014-09-06 – 2014-09-09 (×4): 2000 [IU] via ORAL
  Filled 2014-09-05 (×4): qty 2

## 2014-09-05 MED ORDER — ACETAMINOPHEN 325 MG PO TABS: 650.0000 mg | ORAL_TABLET | ORAL | Status: DC | PRN

## 2014-09-05 MED ORDER — VITAMIN D 50 MCG (2000 UT) PO TABS: 2000.0000 [IU] | ORAL_TABLET | Freq: Every day | ORAL | Status: DC

## 2014-09-05 MED ORDER — GLIMEPIRIDE 4 MG PO TABS
4.0000 mg | ORAL_TABLET | Freq: Every day | ORAL | Status: DC
  Administered 2014-09-06 – 2014-09-09 (×3): 4 mg via ORAL
  Filled 2014-09-05 (×5): qty 1

## 2014-09-05 MED ORDER — SODIUM CHLORIDE 0.9 % IJ SOLN: 3.0000 mL | INTRAMUSCULAR | Status: DC | PRN

## 2014-09-05 MED ORDER — FUROSEMIDE 10 MG/ML IJ SOLN
40.0000 mg | Freq: Every day | INTRAMUSCULAR | Status: DC
  Administered 2014-09-05 – 2014-09-09 (×5): 40 mg via INTRAVENOUS
  Filled 2014-09-05 (×5): qty 4

## 2014-09-05 MED ORDER — APIXABAN 5 MG PO TABS
5.0000 mg | ORAL_TABLET | Freq: Two times a day (BID) | ORAL | Status: DC
  Administered 2014-09-05 – 2014-09-09 (×8): 5 mg via ORAL
  Filled 2014-09-05 (×9): qty 1

## 2014-09-05 MED ORDER — DILTIAZEM HCL 100 MG IV SOLR
5.0000 mg/h | INTRAVENOUS | Status: DC
  Administered 2014-09-05 – 2014-09-06 (×3): 7.5 mg/h via INTRAVENOUS
  Administered 2014-09-07: 10 mg/h via INTRAVENOUS
  Administered 2014-09-07 – 2014-09-08 (×3): 7.5 mg/h via INTRAVENOUS
  Filled 2014-09-05 (×6): qty 100

## 2014-09-05 MED ORDER — LETROZOLE 2.5 MG PO TABS
2.5000 mg | ORAL_TABLET | Freq: Every day | ORAL | Status: DC
  Administered 2014-09-06 – 2014-09-09 (×4): 2.5 mg via ORAL
  Filled 2014-09-05 (×4): qty 1

## 2014-09-05 MED ORDER — ATORVASTATIN CALCIUM 20 MG PO TABS
20.0000 mg | ORAL_TABLET | Freq: Every day | ORAL | Status: DC
Start: 2014-09-06 — End: 2014-09-09
  Administered 2014-09-06 – 2014-09-08 (×3): 20 mg via ORAL
  Filled 2014-09-05 (×4): qty 1

## 2014-09-05 MED ORDER — DILTIAZEM HCL 25 MG/5ML IV SOLN
10.0000 mg | Freq: Once | INTRAVENOUS | Status: AC
  Administered 2014-09-05: 100 mg via INTRAVENOUS
  Administered 2014-09-05: 10 mg via INTRAVENOUS

## 2014-09-05 NOTE — ED Provider Notes (Signed)
CSN: [id number]     Arrival date & time 09/05/14  1211 History   First MD Initiated Contact with Patient 09/05/14 1223     Chief Complaint  Patient presents with  . Shortness of Breath     (Consider location/radiation/quality/duration/timing/severity/associated sxs/prior Treatment) HPI   Darlene Harrell is a 73 y.o. female with past medical history significant for non-insulin-dependent diabetes, hypertension, high cholesterol, tobacco use and A. Fib in the setting of pneumonia in 2013 complaining of shortness of breath worsening over the last 2 weeks. She also reports an associated dyspnea on exertion, denies PND, palpitions, chest pain, fever. Patient was seen by her oncologist Dr. Marin Olp (breast cancer now in [place]) irregular heartbeat was noted, she has not been able to follow with cardiology since that time. She reports increasing peripheral edema and seal-like cough intermittently productive. She takes a daily low-dose aspirin which he had this morning. Denies any cardiac history.   Past Medical History  Diagnosis Date  . Breast cancer     Dr. Marin Olp  . Diabetes mellitus   . Hypertension   . Depression   . Syncopal episodes   . Arthritis   . High cholesterol   . Blood in stool     occasional  . Chicken pox   . Fainting   . Atrial fibrillation     in setting of Pneumonia 6/13 - Echocardiogram 03/30/12: Mild LVH, normal LV function, EF 60-65%, no wall motion abnormalities, mild MR, mild LAE, PASP 32.   Past Surgical History  Procedure Laterality Date  . Tonsillectomy    . Breast surgery  2010 and 2011     biopsy and noted mastectomy   Family History  Problem Relation Age of Onset  . Stroke Mother   . Hypertension Mother   . Mental retardation Mother   . Diabetes Mother   . Alcohol abuse Father   . Hypertension Father   . Diabetes Father   . Diabetes Son   . Kidney failure Son   . Early death Son   . Hyperlipidemia Son    History  Substance Use Topics  .  Smoking status: Current Every Day Smoker -- 1.00 packs/day for 48 years    Types: Cigarettes    Start date: 09/03/1960    Last Attempt to Quit: 03/24/2012  . Smokeless tobacco: Never Used     Comment: restarted 05-2012 & still smoking  . Alcohol Use: Yes     Comment: 4 drinks a year   OB History    No data available     Review of Systems  10 systems reviewed and found to be negative, except as noted in the HPI.   Allergies  Review of patient's allergies indicates no known allergies.  Home Medications   Prior to Admission medications   Medication Sig Start Date End Date Taking? Authorizing Provider  aspirin 325 MG tablet Take 325 mg by mouth daily.    Historical Provider, MD  Cholecalciferol (VITAMIN D) 2000 UNITS tablet Take 2,000 Units by mouth daily.    Historical Provider, MD  clonazePAM (KLONOPIN) 0.5 MG tablet Take 1 tablet (0.5 mg total) by mouth at bedtime as needed. 08/13/14   Midge Minium, MD  glimepiride (AMARYL) 4 MG tablet Take 1 tablet (4 mg total) by mouth daily before breakfast. 02/13/14   Midge Minium, MD  letrozole [facility]) 2.5 MG tablet Take 1 tablet (2.5 mg total) by mouth daily. 05/22/14   Volanda Napoleon, MD  lisinopril-hydrochlorothiazide (ZESTORETIC)  20-12.5 MG per tablet Take 1 tablet by mouth daily. 08/13/14   Midge Minium, MD  metFORMIN (GLUCOPHAGE) 1000 MG tablet TAKE ONE TABLET BY MOUTH TWICE DAILY WITH  A  MEAL 08/13/14   Midge Minium, MD  metoprolol tartrate (LOPRESSOR) 25 MG tablet Take 1 tablet (25 mg total) by mouth 2 (two) times daily. 07/23/13 07/23/14  Midge Minium, MD  NON FORMULARY PT STATES NO MEDICATIONS HAVE CHANGED SINCE [phone]    Historical Provider, MD  simvastatin (ZOCOR) 40 MG tablet Take 1 tablet (40 mg total) by mouth at bedtime. 02/13/14   Midge Minium, MD   BP 142/96 mmHg  Pulse 125  Temp(Src) 98.4 F (36.9 C) (Oral)  Resp 18  Ht 5\' 4"  (1.626 m)  Wt 209 lb (94.802 kg)  BMI 35.86 kg/m2  SpO2  99% Physical Exam  Constitutional: She is oriented to person, place, and time. She appears well-developed and well-nourished. No distress.  HENT:  Head: Normocephalic.  Mouth/Throat: Oropharynx is clear and moist.  Eyes: Conjunctivae and EOM are normal. Pupils are equal, round, and reactive to light.  Neck: Normal range of motion.  Cardiovascular:  Tachycardia and irregular  Pulmonary/Chest: Effort normal and breath sounds normal. No stridor. No respiratory distress. She has no rales. She exhibits no tenderness.  Abdominal: Soft. She exhibits no distension and no mass. There is no tenderness. There is no rebound and no guarding.  Musculoskeletal: Normal range of motion. She exhibits tenderness.  3+ pitting edema bilaterally to mid shin  Neurological: She is alert and oriented to person, place, and time.  Psychiatric: She has a normal mood and affect.  Nursing note and vitals reviewed.   ED Course  Procedures (including critical care time) CRITICAL CARE Performed by: Monico Blitz   Total critical care time: 35  Critical care time was exclusive of separately billable procedures and treating other patients.  Critical care was necessary to treat or prevent imminent or life-threatening deterioration.  Critical care was time spent personally by me on the following activities: development of treatment plan with patient and/or surrogate as well as nursing, discussions with consultants, evaluation of patient's response to treatment, examination of patient, obtaining history from patient or surrogate, ordering and performing treatments and interventions, ordering and review of laboratory studies, ordering and review of radiographic studies, pulse oximetry and re-evaluation of patient's condition.   Labs Review Labs Reviewed  CBC WITH DIFFERENTIAL - Abnormal; Notable for the following:    WBC 11.3 (*)    Platelets 407 (*)    Neutro Abs 8.7 (*)    Monocytes Absolute 1.2 (*)    All  other components within normal limits  COMPREHENSIVE METABOLIC PANEL - Abnormal; Notable for the following:    Sodium 134 (*)    Glucose, Bld 198 (*)    AST 47 (*)    ALT 93 (*)    GFR calc non Af Amer 90 (*)    Anion gap 16 (*)    All other components within normal limits  I-STAT VENOUS BLOOD GAS, ED - Abnormal; Notable for the following:    pH, Ven 7.386 (*)    pCO2, Ven 39.6 (*)    pO2, Ven 47.0 (*)    All other components within normal limits  PROTIME-INR  APTT  TROPONIN I  URINALYSIS, ROUTINE W REFLEX MICROSCOPIC  BLOOD GAS, VENOUS    Imaging Review Dg Chest Port 1 View  09/05/2014   CLINICAL DATA:  Shortness of breath  that worsens with walking. Atrial fibrillation. History of smoking and breast cancer. History of diabetes and right mastectomy.  EXAM: PORTABLE CHEST - 1 VIEW  COMPARISON:  07/04/2014  FINDINGS: The heart is enlarged. There are no focal consolidations or pleural effusions. Small right pleural effusion is noted. Suspect mild interstitial pulmonary edema. No overt alveolar edema. Surgical clips are identified in in the right axillary region.  IMPRESSION: Cardiomegaly and mild interstitial edema.  Small right pleural effusion.   Electronically Signed   By: Shon Hale M.D.   On: 09/05/2014 13:20     EKG Interpretation None     12:33 d/w MD history and plan including dosing of dilaizem  MDM   Final diagnoses:  SOB (shortness of breath)  Atrial fibrillation with RVR    Filed Vitals:   09/05/14 1300 09/05/14 1315 09/05/14 1330 09/05/14 1345  BP: 126/88 142/96 133/73 134/93  Pulse: 116 125 112 105  Temp:      TempSrc:      Resp: 21 18 16 17   Height:      Weight:      SpO2: 98% 99% 98% 98%    Medications  diltiazem (CARDIZEM) injection 10 mg (0 mg Intravenous Stopped 09/05/14 1302)  aspirin chewable tablet 324 mg (243 mg Oral Given 09/05/14 1302)  diltiazem (CARDIZEM) 100 mg in dextrose 5 % 100 mL (1 mg/mL) infusion (7.5 mg/hr Intravenous Rate/Dose  Change 09/05/14 1324)    Darlene Harrell is a 73 y.o. female presenting with shortness of breath and dyspnea on exertion intermittently, worsened significantly this morning. She denies any chest pain. Patient has a history of A. Fib in the setting of pneumonia noted in 2013, she was diagnosed with A. Fib by her oncologist several months ago and has not been able to follow with cardiology. Patient is hemodynamically stable, will be given bolus of 10 mg Cardizem and started on a drip. Her chads2-Vasc score is 4, will give her full dose aspirin in the ED.  Troponin is negative, blood work without significant abnormality. Chest x-ray with cardiomegaly and mild interstitial edema.  Cardiology consult from Dr. Stanford Breed who will admit her to a telemetry bed. Pt is transferred to [facility].    Monico Blitz, PA-C 09/05/14 [place], PA-C 09/05/14 1417  [place], PA-C 09/05/14 1418  Shaune Pollack, MD 09/05/14 [phone]

## 2014-09-05 NOTE — Patient Instructions (Signed)
Called charge nurse and gave update on clinical presentation. Pt will go down now for further work up. Son is with her.

## 2014-09-05 NOTE — Progress Notes (Signed)
PIV L hand functioning well. Pt requests not to be stuck additionally. Floor RN at bedside aware. Lorri Frederick, RN

## 2014-09-05 NOTE — Progress Notes (Signed)
Pre visit review using our clinic review tool, if applicable. No additional management support is needed unless otherwise documented below in the visit note. 

## 2014-09-05 NOTE — Progress Notes (Signed)
Subjective:    Patient ID: Darlene Harrell, female    DOB: [birth year], 73 y.o.   MRN: [id number]  HPI  2 wks of sob very easily when she walks. This is new for her. She has shortness of breath presently. Pt going to se cardiologist on the 20th. Pt rate in our office ekg 136. She woke up at 5 this morning with difficulty breathing. Some swelling to her legs. Pt k has been low. Pt has history of atrial fibrillation. About 7 pound weight gain since September. Pt is a smoker. She is diabetic. In the past pt was rate controlled.  Past Medical History  Diagnosis Date  . Breast cancer     Dr. Marin Olp  . Diabetes mellitus   . Hypertension   . Depression   . Syncopal episodes   . Arthritis   . High cholesterol   . Blood in stool     occasional  . Chicken pox   . Fainting   . Atrial fibrillation     in setting of Pneumonia 6/13 - Echocardiogram 03/30/12: Mild LVH, normal LV function, EF 60-65%, no wall motion abnormalities, mild MR, mild LAE, PASP 32.    History   Social History  . Marital Status: Divorced    Spouse Name: N/A    Number of Children: N/A  . Years of Education: N/A   Occupational History  . Not on file.   Social History Main Topics  . Smoking status: Current Every Day Smoker -- 1.00 packs/day for 48 years    Types: Cigarettes    Start date: 09/03/1960    Last Attempt to Quit: 03/24/2012  . Smokeless tobacco: Never Used     Comment: restarted 05-2012 & still smoking  . Alcohol Use: Yes     Comment: 4 drinks a year  . Drug Use: No  . Sexual Activity: No   Other Topics Concern  . Not on file   Social History Narrative    Past Surgical History  Procedure Laterality Date  . Tonsillectomy    . Breast surgery  2010 and 2011     biopsy and noted mastectomy    Family History  Problem Relation Age of Onset  . Stroke Mother   . Hypertension Mother   . Mental retardation Mother   . Diabetes Mother   . Alcohol abuse Father   . Hypertension Father   .  Diabetes Father   . Diabetes Son   . Kidney failure Son   . Early death Son   . Hyperlipidemia Son     No Known Allergies  Current Outpatient Prescriptions on File Prior to Visit  Medication Sig Dispense Refill  . aspirin 325 MG tablet Take 325 mg by mouth daily.    . Cholecalciferol (VITAMIN D) 2000 UNITS tablet Take 2,000 Units by mouth daily.    . clonazePAM (KLONOPIN) 0.5 MG tablet Take 1 tablet (0.5 mg total) by mouth at bedtime as needed. 90 tablet 1  . glimepiride (AMARYL) 4 MG tablet Take 1 tablet (4 mg total) by mouth daily before breakfast. 90 tablet 1  . letrozole (FEMARA) 2.5 MG tablet Take 1 tablet (2.5 mg total) by mouth daily. 180 tablet 0  . lisinopril-hydrochlorothiazide (ZESTORETIC) 20-12.5 MG per tablet Take 1 tablet by mouth daily. 90 tablet 3  . metFORMIN (GLUCOPHAGE) 1000 MG tablet TAKE ONE TABLET BY MOUTH TWICE DAILY WITH  A  MEAL 180 tablet 1  . NON FORMULARY PT STATES NO MEDICATIONS HAVE CHANGED  SINCE 07-2012    . simvastatin (ZOCOR) 40 MG tablet Take 1 tablet (40 mg total) by mouth at bedtime. 30 tablet 6  . metoprolol tartrate (LOPRESSOR) 25 MG tablet Take 1 tablet (25 mg total) by mouth 2 (two) times daily. 180 tablet 3   No current facility-administered medications on file prior to visit.    BP 118/87 mmHg  Pulse 132  Temp(Src) 96.8 F (36 C) (Oral)  Ht 5\' 4"  (1.626 m)  Wt 209 lb 9.6 oz (95.074 kg)  BMI 35.96 kg/m2  SpO2 96%     Review of Systems See hpi    Objective:   Physical Exam  General- Mild labored breathing. Lungs- CTA Heart- rapid rate and irregular. abdomen- soft, non-tender, non-distended.+BS. Lower ext- 1-2+ edema bilaterally.         Assessment & Plan:

## 2014-09-05 NOTE — ED Notes (Signed)
Sent from her MDs office with SOB that worsens with ambulation. Hx of atrial fib. She is ambulatory on arrival to registration from the office upstairs. Son is with her.

## 2014-09-05 NOTE — H&P (Signed)
See previous note mislabeled as DC summary. Darlene Harrell

## 2014-09-05 NOTE — Assessment & Plan Note (Signed)
With atrial fibrillation , pedal edema and hr 136. Pt needs work up in ED. Sent down and notified charge nurse.

## 2014-09-05 NOTE — Discharge Summary (Signed)
History and Physical  Patient ID: Cris Gibby MRN: [id number], DOB: 10/24/1941 Date of Encounter: 09/05/2014, 6:04 PM Primary Physician: Annye Asa, MD Primary Cardiologist: Dr. Ron Parker first saw patient in 02/2012, and she was scheduled to see Dr. Gwenlyn Found upcoming this month  Chief Complaint: fatigue, dyspnea Reason for Admission: first known recurrence of atrial fibrillation since 2013  HPI: Ms. Wyrick is a 73 y/o F with history of DM2, HTN, HL, depression, breast CA, s/p mastectomy/chemo (Dr. Marin Olp), and remote episode of atrial fibrillation in 2013 in the setting of pneumonia. At that time she was treated with cardizem and amiodarone. She was resistant to starting anticoagulation so it was not started. It was felt that her afib was likely related to acute illness but further observation was felt necessary. During that remote admission, TSH was low but free T4 was okay. Her thyroid function was more recently checked in 07/2014 - TSH was 0.20, free T4 1.20, free T3 2.5. She recently saw Dr. Marin Olp in 07/2014 who noted an irregular HR. She was advised to f/u with cardiology but unfortunately had car trouble and had to reschedule this for this month. She presented to the [place] ED today with complaints of worsening SOB and fatigue over the last 2 weeks. She also reports a nonproductive cough and increasing peripheral edema. In the ER she was found to be in atrial fib with RVR and given a 10mg  IV bolus of cardizem then drip. She has no prior history of stroke or falls. She has occasional BRBPR due to hemorrhoids. She denies chest pain, nausea, vomiting, melena, fever, chills, syncope or near syncope.   Labs significant for WBC 11.3, plt 407, mildly elevated LFTs - AST 47/ALT 93. CXR shows cardiomegaly, mild interstitial edema and small right pleural effusion.   Past Medical History  Diagnosis Date  . Breast cancer     a. s/p mastectomy/chemo, Dr. Marin Olp.  . Diabetes mellitus   .  Hypertension   . Depression   . Syncopal episodes   . Arthritis   . High cholesterol   . Blood in stool     a. due to hemorrhoids.  . Paroxysmal atrial fibrillation     a. In setting of Pneumonia 6/13 - Echocardiogram 03/30/12: Mild LVH, normal LV function, EF 60-65%, no wall motion abnormalities, mild MR, mild LAE, PASP 32.     Most Recent Cardiac Studies: 2D Echo 02/2012 - Left ventricle: The cavity size was normal. Wall thickness was increased in a pattern of mild LVH. Systolic function was normal. The estimated ejection fraction was in the range of 60% to 65%. Wall motion was normal; there were no regional wall motion abnormalities. The study is not technically sufficient to allow evaluation of LV diastolic function. - Mitral valve: Mild regurgitation. - Left atrium: The atrium was mildly dilated. - Pulmonary arteries: PA peak pressure: 16mm Hg (S).   Surgical History:  Past Surgical History  Procedure Laterality Date  . Tonsillectomy    . Breast surgery  2010 and 2011     biopsy and noted mastectomy     Home Meds: Prior to Admission medications   Medication Sig Start Date End Date Taking? Authorizing Provider  aspirin 325 MG tablet Take 325 mg by mouth daily.    Historical Provider, MD  Cholecalciferol (VITAMIN D) 2000 UNITS tablet Take 2,000 Units by mouth daily.    Historical Provider, MD  clonazePAM (KLONOPIN) 0.5 MG tablet Take 1 tablet (0.5 mg total) by mouth at  bedtime as needed. 08/13/14   Midge Minium, MD  glimepiride (AMARYL) 4 MG tablet Take 1 tablet (4 mg total) by mouth daily before breakfast. 02/13/14   Midge Minium, MD  letrozole [facility]) 2.5 MG tablet Take 1 tablet (2.5 mg total) by mouth daily. 05/22/14   Volanda Napoleon, MD  lisinopril-hydrochlorothiazide (ZESTORETIC) 20-12.5 MG per tablet Take 1 tablet by mouth daily. 08/13/14   Midge Minium, MD  metFORMIN (GLUCOPHAGE) 1000 MG tablet TAKE ONE TABLET BY MOUTH TWICE DAILY WITH  A   MEAL 08/13/14   Midge Minium, MD  metoprolol tartrate (LOPRESSOR) 25 MG tablet Take 1 tablet (25 mg total) by mouth 2 (two) times daily. 07/23/13 07/23/14  Midge Minium, MD  NON FORMULARY PT STATES NO MEDICATIONS HAVE CHANGED SINCE [phone]    Historical Provider, MD  simvastatin (ZOCOR) 40 MG tablet Take 1 tablet (40 mg total) by mouth at bedtime. 02/13/14   Midge Minium, MD    Allergies: No Known Allergies  History   Social History  . Marital Status: Divorced    Spouse Name: N/A    Number of Children: N/A  . Years of Education: N/A   Occupational History  . Not on file.   Social History Main Topics  . Smoking status: Current Every Day Smoker -- 1.00 packs/day for 48 years    Types: Cigarettes    Start date: 09/03/1960    Last Attempt to Quit: 03/24/2012  . Smokeless tobacco: Never Used     Comment: restarted 05-2012 & still smoking  . Alcohol Use: No  . Drug Use: No  . Sexual Activity: No   Other Topics Concern  . Not on file   Social History Narrative     Family History  Problem Relation Age of Onset  . Stroke Mother   . Hypertension Mother   . Mental retardation Mother   . Diabetes Mother   . Alcohol abuse Father   . Hypertension Father   . Diabetes Father   . Diabetes Son   . Kidney failure Son   . Early death Son   . Hyperlipidemia Son   . Heart disease Mother     Review of Systems: All other systems reviewed and are otherwise negative except as noted above.  Labs:   Lab Results  Component Value Date   WBC 11.3* 09/05/2014   HGB 12.0 09/05/2014   HCT 37.2 09/05/2014   MCV 86.3 09/05/2014   PLT 407* 09/05/2014    Recent Labs Lab 09/05/14 1230  NA 134*  K 4.3  CL 96  CO2 22  BUN 7  CREATININE 0.60  CALCIUM 9.5  PROT 7.1  BILITOT 0.7  ALKPHOS 97  ALT 93*  AST 47*  GLUCOSE 198*    Recent Labs  09/05/14 1230  TROPONINI <0.30   Lab Results  Component Value Date   CHOL 117 05/27/2014   HDL 34.70* 05/27/2014    LDLCALC 57 05/27/2014   TRIG 125.0 05/27/2014     Radiology/Studies:  Dg Chest Port 1 View  09/05/2014   CLINICAL DATA:  Shortness of breath that worsens with walking. Atrial fibrillation. History of smoking and breast cancer. History of diabetes and right mastectomy.  EXAM: PORTABLE CHEST - 1 VIEW  COMPARISON:  07/04/2014  FINDINGS: The heart is enlarged. There are no focal consolidations or pleural effusions. Small right pleural effusion is noted. Suspect mild interstitial pulmonary edema. No overt alveolar edema. Surgical clips are identified in  in the right axillary region.  IMPRESSION: Cardiomegaly and mild interstitial edema.  Small right pleural effusion.   Electronically Signed   By: Shon Hale M.D.   On: 09/05/2014 13:20   EKG: atrial fibrillation with RVR and nonspecific ST-T changes  Physical Exam: Blood pressure 138/76, pulse 85, temperature 98.4 F (36.9 C), temperature source Oral, resp. rate 19, height 5\' 4"  (1.626 m), weight 209 lb (94.802 kg), SpO2 99 %. General: Well developed, well nourished, in no acute distress. S/p mastectomy on the right. Head: Normocephalic, atraumatic, sclera non-icteric, no xanthomas, nares are without discharge.  Neck: Negative for carotid bruits. JVD not elevated. Lungs: Diminished BS at bases. No wheezes, rales, or rhonchi. Breathing is unlabored. Heart: Irregularly irregular with S1 S2. No murmurs, rubs, or gallops appreciated. Abdomen: Soft, non-tender, non-distended with normoactive bowel sounds. No hepatomegaly. No rebound/guarding. No obvious abdominal masses. Msk:  Strength and tone appear normal for age. Extremities: No clubbing or cyanosis. 1+ edema.  Distal pedal pulses are 2+ and equal bilaterally. Neuro: Alert and oriented X 3. No focal deficit. No facial asymmetry. Moves all extremities spontaneously. Psych:  Responds to questions appropriately with a normal affect.    ASSESSMENT AND PLAN:   1. Recurrence of paroxysmal atrial  fibrillation 2. Acute diastolic CHF 3. Hypertension 4. Diabetes mellitus 5. Leukocytosis/thrombocytosis ? r/t #1 6. Mildly elevated LFTs 7. ? Subclinical hyperthyroidism  See below.  Signed, Dayna Dunn PA-C 09/05/2014, 6:04 PM As above, patient seen and examined. Briefly she is a 73 year old female with a past medical history of diabetes mellitus, hypertension, hyperlipidemia, breast cancer, previous atrial fibrillation in the setting of pneumonia admitted with atrial fibrillation. The patient was seen by oncology in September 2014  and heart rate noted to be regular. Electrocardiogram showed atrial fibrillation with rate related left bundle branch block. She was scheduled to see cardiology but was unable to keep her appointment. Over the past week she has noted progressive dyspnea on exertion and had an episode of PND last evening. She also complained of bilateral lower extremity edema. She also has had palpitations and fatigue. No chest pain or syncope. She was seen in the emergency room and noted to be in atrial fibrillation and is now admitted. Chest x-ray showsInterstitial edema and small right effusion. TSH 0.20. Creatinine 0.6 and hemoglobin 12. Electrocardiogram shows atrial fibrillation with a rapid ventricular response.  Plan to admit to telemetry. We will control rate with Cardizem. Check echocardiogram. I will hold lisinopril and metoprolol until we see what her blood pressure does with addition of Cardizem. Her TSH is decreased at 0.20. We are concerned about the possibility of subclinical hyperthyroidism contributing to atrial fibrillation. We will plan to repeat this and if it remains decreased she would need endocrinology evaluation. She has embolic risk factors of female sex, age greater than 60, diabetes mellitus, hypertension and now congestive heart failure. Discontinue aspirin. Begin apixaban 5 mg by mouth twice a day. She is volume overloaded. I will give Lasix 40 mg IV daily and  follow renal function. If we are successful in controlling her heart rate and her symptoms improve with diuresis then would favor rate control and anticoagulation until her thyroid is addressed. If we have difficulty controlling heart rate or her symptoms do not improve with the above measures we could proceed with TEE guided cardioversion. Patient counseled on discontinuing tobacco use. Kirk Ruths

## 2014-09-05 NOTE — ED Notes (Signed)
[facility] knows about the bed on [id number]

## 2014-09-06 DIAGNOSIS — E119 Type 2 diabetes mellitus without complications: Secondary | ICD-10-CM | POA: Diagnosis not present

## 2014-09-06 DIAGNOSIS — Z6834 Body mass index (BMI) 34.0-34.9, adult: Secondary | ICD-10-CM | POA: Diagnosis not present

## 2014-09-06 DIAGNOSIS — I480 Paroxysmal atrial fibrillation: Secondary | ICD-10-CM | POA: Diagnosis not present

## 2014-09-06 DIAGNOSIS — F329 Major depressive disorder, single episode, unspecified: Secondary | ICD-10-CM | POA: Diagnosis present

## 2014-09-06 DIAGNOSIS — I4891 Unspecified atrial fibrillation: Secondary | ICD-10-CM

## 2014-09-06 DIAGNOSIS — Z79899 Other long term (current) drug therapy: Secondary | ICD-10-CM | POA: Diagnosis not present

## 2014-09-06 DIAGNOSIS — I5031 Acute diastolic (congestive) heart failure: Secondary | ICD-10-CM | POA: Diagnosis not present

## 2014-09-06 DIAGNOSIS — I341 Nonrheumatic mitral (valve) prolapse: Secondary | ICD-10-CM | POA: Diagnosis not present

## 2014-09-06 DIAGNOSIS — R609 Edema, unspecified: Secondary | ICD-10-CM | POA: Diagnosis not present

## 2014-09-06 DIAGNOSIS — C50919 Malignant neoplasm of unspecified site of unspecified female breast: Secondary | ICD-10-CM | POA: Diagnosis not present

## 2014-09-06 DIAGNOSIS — Z79811 Long term (current) use of aromatase inhibitors: Secondary | ICD-10-CM | POA: Diagnosis not present

## 2014-09-06 DIAGNOSIS — R0602 Shortness of breath: Secondary | ICD-10-CM | POA: Diagnosis present

## 2014-09-06 DIAGNOSIS — Z7982 Long term (current) use of aspirin: Secondary | ICD-10-CM | POA: Diagnosis not present

## 2014-09-06 DIAGNOSIS — I369 Nonrheumatic tricuspid valve disorder, unspecified: Secondary | ICD-10-CM | POA: Diagnosis not present

## 2014-09-06 DIAGNOSIS — Z9221 Personal history of antineoplastic chemotherapy: Secondary | ICD-10-CM | POA: Diagnosis not present

## 2014-09-06 DIAGNOSIS — I10 Essential (primary) hypertension: Secondary | ICD-10-CM | POA: Diagnosis present

## 2014-09-06 DIAGNOSIS — F17210 Nicotine dependence, cigarettes, uncomplicated: Secondary | ICD-10-CM | POA: Diagnosis present

## 2014-09-06 DIAGNOSIS — Z9010 Acquired absence of unspecified breast and nipple: Secondary | ICD-10-CM | POA: Diagnosis present

## 2014-09-06 DIAGNOSIS — E785 Hyperlipidemia, unspecified: Secondary | ICD-10-CM | POA: Diagnosis present

## 2014-09-06 LAB — COMPREHENSIVE METABOLIC PANEL
ALT: 77 U/L — ABNORMAL HIGH (ref 0–35)
AST: 39 U/L — AB (ref 0–37)
Albumin: 3.4 g/dL — ABNORMAL LOW (ref 3.5–5.2)
Alkaline Phosphatase: 85 U/L (ref 39–117)
Anion gap: 16 — ABNORMAL HIGH (ref 5–15)
BUN: 8 mg/dL (ref 6–23)
CALCIUM: 9.1 mg/dL (ref 8.4–10.5)
CO2: 22 mEq/L (ref 19–32)
CREATININE: 0.61 mg/dL (ref 0.50–1.10)
Chloride: 96 mEq/L (ref 96–112)
GFR calc non Af Amer: 89 mL/min — ABNORMAL LOW (ref >90)
Glucose, Bld: 159 mg/dL — ABNORMAL HIGH (ref 70–99)
Potassium: 3.7 mEq/L (ref 3.7–5.3)
SODIUM: 134 meq/L — AB (ref 137–147)
TOTAL PROTEIN: 6.6 g/dL (ref 6.0–8.3)
Total Bilirubin: 0.6 mg/dL (ref 0.3–1.2)

## 2014-09-06 LAB — GLUCOSE, CAPILLARY
GLUCOSE-CAPILLARY: 169 mg/dL — AB (ref 70–99)
Glucose-Capillary: 262 mg/dL — ABNORMAL HIGH (ref 70–99)
Glucose-Capillary: 144 mg/dL — ABNORMAL HIGH (ref 70–99)
Glucose-Capillary: 186 mg/dL — ABNORMAL HIGH (ref 70–99)

## 2014-09-06 LAB — URINALYSIS, ROUTINE W REFLEX MICROSCOPIC
BILIRUBIN URINE: NEGATIVE
GLUCOSE, UA: NEGATIVE mg/dL
Ketones, ur: NEGATIVE mg/dL
Leukocytes, UA: NEGATIVE
Nitrite: NEGATIVE
PH: 7 (ref 5.0–8.0)
Protein, ur: NEGATIVE mg/dL
Specific Gravity, Urine: 1.008 (ref 1.005–1.030)
Urobilinogen, UA: 0.2 mg/dL (ref 0.0–1.0)

## 2014-09-06 LAB — URINE MICROSCOPIC-ADD ON

## 2014-09-06 LAB — LIPID PANEL
Cholesterol: 88 mg/dL (ref 0–200)
HDL: 26 mg/dL — AB (ref >39)
LDL Cholesterol: 44 mg/dL (ref 0–99)
Total CHOL/HDL Ratio: 3.4 RATIO
Triglycerides: 89 mg/dL (ref <150)
VLDL: 18 mg/dL (ref 0–40)

## 2014-09-06 LAB — CBC
HCT: 34.4 % — ABNORMAL LOW (ref 36.0–46.0)
HEMOGLOBIN: 11.1 g/dL — AB (ref 12.0–15.0)
MCH: 27.3 pg (ref 26.0–34.0)
MCHC: 32.3 g/dL (ref 30.0–36.0)
MCV: 84.7 fL (ref 78.0–100.0)
PLATELETS: 308 10*3/uL (ref 150–400)
RBC: 4.06 MIL/uL (ref 3.87–5.11)
RDW: 14.6 % (ref 11.5–15.5)
WBC: 7.4 10*3/uL (ref 4.0–10.5)

## 2014-09-06 LAB — T4, FREE: FREE T4: 1.53 ng/dL (ref 0.80–1.80)

## 2014-09-06 MED ORDER — OFF THE BEAT BOOK
Freq: Once | Status: DC
  Filled 2014-09-06: qty 1

## 2014-09-06 NOTE — Progress Notes (Signed)
UR completed 

## 2014-09-06 NOTE — Plan of Care (Signed)
Problem: Phase I Progression Outcomes Goal: Ventricular heart rate < 120/min Outcome: Progressing Goal: Pain controlled with appropriate interventions Outcome: Completed/Met Date Met:  09/06/14

## 2014-09-06 NOTE — Progress Notes (Signed)
Patient ID: Darlene Harrell, female   DOB: 1941-02-08, 73 y.o.   MRN: [id number]    Subjective:  Less dyspnea   Objective:  Filed Vitals:   09/05/14 1530 09/05/14 2010 09/06/14 0608 09/06/14 0747  BP: 138/76 131/69 115/91 128/82  Pulse: 85  110   Temp:  98.4 F (36.9 C) 98.7 F (37.1 C)   TempSrc:  Oral Oral   Resp: 19  17   Height:      Weight:   91.9 kg (202 lb 9.6 oz)   SpO2: 99% 94% 96%     Intake/Output from previous day:  Intake/Output Summary (Last 24 hours) at 09/06/14 1210 Last data filed at 09/06/14 0900  Gross per 24 hour  Intake    240 ml  Output      0 ml  Net    240 ml    Physical Exam: Affect appropriate Overweight elderly femal  HEENT: normal Neck supple with no adenopathy JVP normal no bruits no thyromegaly Lungs clear with no wheezing and good diaphragmatic motion Heart:  S1/S2 no murmur, no rub, gallop or click PMI normal Abdomen: benighn, BS positve, no tenderness, no AAA no bruit.  No HSM or HJR Distal pulses intact with no bruits Plus one bilateral edema Neuro non-focal Skin warm and dry No muscular weakness   Lab Results: Basic Metabolic Panel:  Recent Labs  09/05/14 1230 09/05/14 1936 09/06/14 0526  NA 134*  --  134*  K 4.3  --  3.7  CL 96  --  96  CO2 22  --  22  GLUCOSE 198*  --  159*  BUN 7  --  8  CREATININE 0.60  --  0.61  CALCIUM 9.5  --  9.1  MG  --  1.8  --    Liver Function Tests:  Recent Labs  09/05/14 1230 09/06/14 0526  AST 47* 39*  ALT 93* 77*  ALKPHOS 97 85  BILITOT 0.7 0.6  PROT 7.1 6.6  ALBUMIN 3.9 3.4*   CBC:  Recent Labs  09/05/14 1230 09/06/14 0526  WBC 11.3* 7.4  NEUTROABS 8.7*  --   HGB 12.0 11.1*  HCT 37.2 34.4*  MCV 86.3 84.7  PLT 407* 308   Cardiac Enzymes:  Recent Labs  09/05/14 1230  TROPONINI <0.30   Fasting Lipid Panel:  Recent Labs  09/06/14 0526  CHOL 88  HDL 26*  LDLCALC 44  TRIG 89  CHOLHDL 3.4   Thyroid Function Tests:  Recent Labs  09/05/14 1936    TSH 0.336*    Imaging: Dg Chest Port 1 View  09/05/2014   CLINICAL DATA:  Shortness of breath that worsens with walking. Atrial fibrillation. History of smoking and breast cancer. History of diabetes and right mastectomy.  EXAM: PORTABLE CHEST - 1 VIEW  COMPARISON:  07/04/2014  FINDINGS: The heart is enlarged. There are no focal consolidations or pleural effusions. Small right pleural effusion is noted. Suspect mild interstitial pulmonary edema. No overt alveolar edema. Surgical clips are identified in in the right axillary region.  IMPRESSION: Cardiomegaly and mild interstitial edema.  Small right pleural effusion.   Electronically Signed   By: Shon Hale M.D.   On: 09/05/2014 13:20    Cardiac Studies:  ECG:  afib nonspecific ST changes   Telemetry: afib rates 80-110  Echo:   Medications:   . apixaban  5 mg Oral BID  . atorvastatin  20 mg Oral QHS  . cholecalciferol  2,000 Units Oral Daily  .  furosemide  40 mg Intravenous Daily  . glimepiride  4 mg Oral QAC breakfast  . insulin aspart  0-9 Units Subcutaneous TID WC  . letrozole  2.5 mg Oral Daily  . sodium chloride  3 mL Intravenous Q12H     . diltiazem (CARDIZEM) infusion 7.5 mg/hr (09/06/14 0129)    Assessment/Plan:  Afib:  Echo pending  Continue eliquis and cardizem  Change to PO in am Edema:  Echo to assess EF  Continue lasix  Cr ok DM:  SS insulin Thyroid:  TSH lower limits of normal but normal T4 Chol:  On statin   [name] 09/06/2014, 12:10 PM

## 2014-09-06 NOTE — Progress Notes (Signed)
  Echocardiogram 2D Echocardiogram has been performed.  Mauricio Po 09/06/2014, 11:37 AM

## 2014-09-06 NOTE — Plan of Care (Signed)
Problem: Phase II Progression Outcomes Goal: Ventricular heart rate < 100/min Outcome: Completed/Met Date Met:  09/06/14 Goal: Anticoagulation Therapy per MD order Outcome: Completed/Met Date Met:  09/06/14 Goal: Pain controlled Outcome: Completed/Met Date Met:  09/06/14

## 2014-09-07 LAB — GLUCOSE, CAPILLARY
GLUCOSE-CAPILLARY: 189 mg/dL — AB (ref 70–99)
Glucose-Capillary: 249 mg/dL — ABNORMAL HIGH (ref 70–99)
Glucose-Capillary: 215 mg/dL — ABNORMAL HIGH (ref 70–99)
Glucose-Capillary: 146 mg/dL — ABNORMAL HIGH (ref 70–99)

## 2014-09-07 MED ORDER — SODIUM CHLORIDE 0.9 % IV SOLN
INTRAVENOUS | Status: DC
  Administered 2014-09-07: 17:00:00 via INTRAVENOUS

## 2014-09-07 NOTE — Plan of Care (Signed)
Problem: Phase III Progression Outcomes Goal: Anticoagulation Therapy per MD order Outcome: Completed/Met Date Met:  09/07/14 Goal: Pain controlled on oral analgesia Outcome: Completed/Met Date Met:  09/07/14 Goal: Tolerating diet Outcome: Completed/Met Date Met:  09/07/14

## 2014-09-07 NOTE — Plan of Care (Signed)
Problem: Phase II Progression Outcomes Goal: Progress activity as tolerated unless otherwise ordered Outcome: Completed/Met Date Met:  09/07/14     

## 2014-09-07 NOTE — Progress Notes (Signed)
Patient ID: Darlene Harrell, female   DOB: 1941-07-24, 73 y.o.   MRN: [id number]    Subjective:  Less dyspnea   Objective:  Filed Vitals:   09/06/14 0747 09/06/14 1510 09/06/14 2105 09/07/14 0700  BP: 128/82 125/69 142/92 128/88  Pulse:  77 72 92  Temp:  98.4 F (36.9 C) 98.5 F (36.9 C) 97.7 F (36.5 C)  TempSrc:  Oral Oral Oral  Resp:  18 18 18   Height:      Weight:    91.4 kg (201 lb 8 oz)  SpO2:  93% 97% 92%    Intake/Output from previous day:  Intake/Output Summary (Last 24 hours) at 09/07/14 1128 Last data filed at 09/06/14 1827  Gross per 24 hour  Intake    690 ml  Output    500 ml  Net    190 ml    Physical Exam: Affect appropriate Overweight elderly femal  HEENT: normal Neck supple with no adenopathy JVP normal no bruits no thyromegaly Lungs clear with no wheezing and good diaphragmatic motion Heart:  S1/S2 no murmur, no rub, gallop or click PMI normal Abdomen: benighn, BS positve, no tenderness, no AAA no bruit.  No HSM or HJR Distal pulses intact with no bruits Plus one bilateral edema Neuro non-focal Skin warm and dry No muscular weakness   Lab Results: Basic Metabolic Panel:  Recent Labs  09/05/14 1230 09/05/14 1936 09/06/14 0526  NA 134*  --  134*  K 4.3  --  3.7  CL 96  --  96  CO2 22  --  22  GLUCOSE 198*  --  159*  BUN 7  --  8  CREATININE 0.60  --  0.61  CALCIUM 9.5  --  9.1  MG  --  1.8  --    Liver Function Tests:  Recent Labs  09/05/14 1230 09/06/14 0526  AST 47* 39*  ALT 93* 77*  ALKPHOS 97 85  BILITOT 0.7 0.6  PROT 7.1 6.6  ALBUMIN 3.9 3.4*   CBC:  Recent Labs  09/05/14 1230 09/06/14 0526  WBC 11.3* 7.4  NEUTROABS 8.7*  --   HGB 12.0 11.1*  HCT 37.2 34.4*  MCV 86.3 84.7  PLT 407* 308   Cardiac Enzymes:  Recent Labs  09/05/14 1230  TROPONINI <0.30   Fasting Lipid Panel:  Recent Labs  09/06/14 0526  CHOL 88  HDL 26*  LDLCALC 44  TRIG 89  CHOLHDL 3.4   Thyroid Function Tests:  Recent  Labs  09/05/14 1936  TSH 0.336*    Imaging: Dg Chest Port 1 View  09/05/2014   CLINICAL DATA:  Shortness of breath that worsens with walking. Atrial fibrillation. History of smoking and breast cancer. History of diabetes and right mastectomy.  EXAM: PORTABLE CHEST - 1 VIEW  COMPARISON:  07/04/2014  FINDINGS: The heart is enlarged. There are no focal consolidations or pleural effusions. Small right pleural effusion is noted. Suspect mild interstitial pulmonary edema. No overt alveolar edema. Surgical clips are identified in in the right axillary region.  IMPRESSION: Cardiomegaly and mild interstitial edema.  Small right pleural effusion.   Electronically Signed   By: Shon Hale M.D.   On: 09/05/2014 13:20    Cardiac Studies:  ECG:  afib nonspecific ST changes   Telemetry: afib rates 80-110  Echo: Study Conclusions  - Left ventricle: The cavity size was mildly dilated. Wall thickness was normal. The estimated ejection fraction was 40%. Diffuse hypokinesis. - Mitral valve: There  was mild to moderate regurgitation. - Left atrium: The atrium was moderately dilated. - Right atrium: The atrium was moderately dilated. - Atrial septum: No defect or patent foramen ovale was identified. - Tricuspid valve: There was moderate regurgitation.  Medications:   . apixaban  5 mg Oral BID  . atorvastatin  20 mg Oral QHS  . cholecalciferol  2,000 Units Oral Daily  . furosemide  40 mg Intravenous Daily  . glimepiride  4 mg Oral QAC breakfast  . insulin aspart  0-9 Units Subcutaneous TID WC  . letrozole  2.5 mg Oral Daily  . off the beat book   Does not apply Once  . sodium chloride  3 mL Intravenous Q12H     . 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

## 2014-09-07 NOTE — Plan of Care (Signed)
Problem: Phase II Progression Outcomes Goal: CV Risk Factors identified Outcome: Completed/Met Date Met:  09/07/14

## 2014-09-08 LAB — GLUCOSE, CAPILLARY
GLUCOSE-CAPILLARY: 234 mg/dL — AB (ref 70–99)
Glucose-Capillary: 169 mg/dL — ABNORMAL HIGH (ref 70–99)
Glucose-Capillary: 191 mg/dL — ABNORMAL HIGH (ref 70–99)
Glucose-Capillary: 250 mg/dL — ABNORMAL HIGH (ref 70–99)

## 2014-09-08 MED ORDER — MAGNESIUM HYDROXIDE 400 MG/5ML PO SUSP
30.0000 mL | Freq: Every day | ORAL | Status: DC | PRN
  Administered 2014-09-08 – 2014-09-09 (×2): 30 mL via ORAL
  Filled 2014-09-08 (×2): qty 30

## 2014-09-08 NOTE — Progress Notes (Signed)
Pt ambulating in hall, HR in RVR, educated on activity, back to room. [name] 6:53 PM

## 2014-09-08 NOTE — Progress Notes (Signed)
Patient ID: Darlene Harrell, female   DOB: November 15, 1940, 73 y.o.   MRN: [id number]    Subjective:  Less dyspnea TEE [facility] cannot be scheduled until 11/10  8:00 am   Objective:  Filed Vitals:   09/07/14 0700 09/07/14 1515 09/07/14 2210 09/08/14 0611  BP: 128/88 137/67 129/89 100/68  Pulse: 92 89 91 85  Temp: 97.7 F (36.5 C) 98.3 F (36.8 C) 98 F (36.7 C) 98.5 F (36.9 C)  TempSrc: Oral Oral Oral Oral  Resp: 18 17 18 18   Height:      Weight: 91.4 kg (201 lb 8 oz)     SpO2: 92% 97% 96% 94%    Intake/Output from previous day:  Intake/Output Summary (Last 24 hours) at 09/08/14 0911 Last data filed at 09/08/14 0600  Gross per 24 hour  Intake    480 ml  Output    800 ml  Net   -320 ml    Physical Exam: Affect appropriate Overweight elderly femal  HEENT: normal Neck supple with no adenopathy JVP normal no bruits no thyromegaly Lungs clear with no wheezing and good diaphragmatic motion Heart:  S1/S2 no murmur, no rub, gallop or click PMI normal Abdomen: benighn, BS positve, no tenderness, no AAA no bruit.  No HSM or HJR Distal pulses intact with no bruits Plus one bilateral edema Neuro non-focal Skin warm and dry No muscular weakness   Lab Results: Basic Metabolic Panel:  Recent Labs  09/05/14 1230 09/05/14 1936 09/06/14 0526  NA 134*  --  134*  K 4.3  --  3.7  CL 96  --  96  CO2 22  --  22  GLUCOSE 198*  --  159*  BUN 7  --  8  CREATININE 0.60  --  0.61  CALCIUM 9.5  --  9.1  MG  --  1.8  --    Liver Function Tests:  Recent Labs  09/05/14 1230 09/06/14 0526  AST 47* 39*  ALT 93* 77*  ALKPHOS 97 85  BILITOT 0.7 0.6  PROT 7.1 6.6  ALBUMIN 3.9 3.4*   CBC:  Recent Labs  09/05/14 1230 09/06/14 0526  WBC 11.3* 7.4  NEUTROABS 8.7*  --   HGB 12.0 11.1*  HCT 37.2 34.4*  MCV 86.3 84.7  PLT 407* 308   Cardiac Enzymes:  Recent Labs  09/05/14 1230  TROPONINI <0.30   Fasting Lipid Panel:  Recent Labs  09/06/14 0526  CHOL 88  HDL 26*    LDLCALC 44  TRIG 89  CHOLHDL 3.4   Thyroid Function Tests:  Recent Labs  09/05/14 1936  TSH 0.336*    Imaging: No results found.  Cardiac Studies:  ECG:  afib nonspecific ST changes   Telemetry: afib rates 80-110  Echo: Study Conclusions  - Left ventricle: The cavity size was mildly dilated. Wall thickness was normal. The estimated ejection fraction was 40%. Diffuse hypokinesis. - Mitral valve: There was mild to moderate regurgitation. - Left atrium: The atrium was moderately dilated. - Right atrium: The atrium was moderately dilated. - Atrial septum: No defect or patent foramen ovale was identified. - Tricuspid valve: There was moderate regurgitation.  Medications:   . apixaban  5 mg Oral BID  . atorvastatin  20 mg Oral QHS  . cholecalciferol  2,000 Units Oral Daily  . furosemide  40 mg Intravenous Daily  . glimepiride  4 mg Oral QAC breakfast  . insulin aspart  0-9 Units Subcutaneous TID WC  . letrozole  2.5 mg Oral Daily  . off the beat book   Does not apply Once  . sodium chloride  3 mL Intravenous Q12H     . sodium chloride 20 mL/hr at 09/07/14 1658  . 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

## 2014-09-09 ENCOUNTER — Encounter (HOSPITAL_COMMUNITY)
Admission: EM | Disposition: A | Discharge status: Home / Self Care | Payer: Self-pay | Source: Home / Self Care | Attending: Cardiology

## 2014-09-09 ENCOUNTER — Inpatient Hospital Stay (HOSPITAL_COMMUNITY): Payer: Medicare Other | Admitting: Anesthesiology

## 2014-09-09 ENCOUNTER — Encounter (HOSPITAL_COMMUNITY): Payer: Self-pay | Admitting: Gastroenterology

## 2014-09-09 DIAGNOSIS — I4891 Unspecified atrial fibrillation: Secondary | ICD-10-CM

## 2014-09-09 DIAGNOSIS — I341 Nonrheumatic mitral (valve) prolapse: Secondary | ICD-10-CM

## 2014-09-09 DIAGNOSIS — R609 Edema, unspecified: Secondary | ICD-10-CM

## 2014-09-09 DIAGNOSIS — I519 Heart disease, unspecified: Secondary | ICD-10-CM

## 2014-09-09 HISTORY — PX: TEE WITHOUT CARDIOVERSION: SHX5443

## 2014-09-09 HISTORY — PX: CARDIOVERSION: SHX1299

## 2014-09-09 HISTORY — DX: Heart disease, unspecified: I51.9

## 2014-09-09 LAB — GLUCOSE, CAPILLARY
Glucose-Capillary: 186 mg/dL — ABNORMAL HIGH (ref 70–99)
Glucose-Capillary: 157 mg/dL — ABNORMAL HIGH (ref 70–99)

## 2014-09-09 SURGERY — ECHOCARDIOGRAM, TRANSESOPHAGEAL
Anesthesia: Monitor Anesthesia Care

## 2014-09-09 MED ORDER — LISINOPRIL 5 MG PO TABS: 5.0000 mg | ORAL_TABLET | Freq: Every day | ORAL | Status: DC

## 2014-09-09 MED ORDER — LIDOCAINE HCL (CARDIAC) 20 MG/ML IV SOLN
INTRAVENOUS | Status: DC | PRN
  Administered 2014-09-09: 50 mg via INTRAVENOUS

## 2014-09-09 MED ORDER — BUTAMBEN-TETRACAINE-BENZOCAINE 2-2-14 % EX AERO
INHALATION_SPRAY | CUTANEOUS | Status: DC | PRN
  Administered 2014-09-09: 2 via TOPICAL

## 2014-09-09 MED ORDER — APIXABAN 5 MG PO TABS: 5.0000 mg | ORAL_TABLET | Freq: Two times a day (BID) | ORAL | Status: DC

## 2014-09-09 MED ORDER — AMIODARONE HCL 400 MG PO TABS: 400.0000 mg | ORAL_TABLET | Freq: Two times a day (BID) | ORAL | Status: DC

## 2014-09-09 MED ORDER — CARVEDILOL 6.25 MG PO TABS: 6.2500 mg | ORAL_TABLET | Freq: Two times a day (BID) | ORAL | Status: DC

## 2014-09-09 MED ORDER — ACETAMINOPHEN 325 MG PO TABS: 650.0000 mg | ORAL_TABLET | ORAL | Status: DC | PRN

## 2014-09-09 MED ORDER — PROPOFOL INFUSION 10 MG/ML OPTIME
INTRAVENOUS | Status: DC | PRN
  Administered 2014-09-09: 75 ug/kg/min via INTRAVENOUS

## 2014-09-09 MED ORDER — SILVER SULFADIAZINE 1 % EX CREA: TOPICAL_CREAM | Freq: Two times a day (BID) | CUTANEOUS | Status: DC

## 2014-09-09 MED ORDER — AMIODARONE HCL 200 MG PO TABS
400.0000 mg | ORAL_TABLET | Freq: Two times a day (BID) | ORAL | Status: DC
Start: 2014-09-09 — End: 2014-09-09
  Administered 2014-09-09: 400 mg via ORAL
  Filled 2014-09-09 (×2): qty 2

## 2014-09-09 MED ORDER — FUROSEMIDE 40 MG PO TABS: 40.0000 mg | ORAL_TABLET | Freq: Every day | ORAL | Status: DC

## 2014-09-09 MED ORDER — ATORVASTATIN CALCIUM 20 MG PO TABS: 20.0000 mg | ORAL_TABLET | Freq: Every day | ORAL | Status: DC

## 2014-09-09 MED ORDER — SILVER SULFADIAZINE 1 % EX CREA
TOPICAL_CREAM | Freq: Two times a day (BID) | CUTANEOUS | Status: DC
  Administered 2014-09-09: 1 via TOPICAL
  Filled 2014-09-09: qty 85

## 2014-09-09 NOTE — Transfer of Care (Signed)
Immediate Anesthesia Transfer of Care Note  Patient: Darlene Harrell  Procedure(s) Performed: Procedure(s): TRANSESOPHAGEAL ECHOCARDIOGRAM (TEE) (N/A) CARDIOVERSION (N/A)  Patient Location: Endoscopy Unit  Anesthesia Type:MAC  Level of Consciousness: awake, oriented and patient cooperative  Airway & Oxygen Therapy: Patient Spontanous Breathing and Patient connected to nasal cannula oxygen  Post-op Assessment: Report given to PACU RN and Post -op Vital signs reviewed and stable  Post vital signs: Reviewed  Complications: No apparent anesthesia complications

## 2014-09-09 NOTE — Progress Notes (Signed)
Dr. Johnsie Cancel paged per pt request for cream for irration to chest from cardioversion. Kathleen Argue S 11:58 AM

## 2014-09-09 NOTE — Care Management Note (Signed)
    Page 1 of 1   09/09/2014     5:09:03 PM CARE MANAGEMENT NOTE 09/09/2014  Patient:  Darlene Harrell,Darlene Harrell   Account Number:  [id number]  Date Initiated:  09/09/2014  Documentation initiated by:  Mattilyn Crites  Subjective/Objective Assessment:   Pt adm on 09/05/14 with afib.  PTA, pt independent, lives with son.     Action/Plan:   CM referral for Eliquis.   Anticipated DC Date:  09/09/2014   Anticipated DC Plan:  [place]  CM consult  Medication Assistance      Choice offered to / List presented to:             Status of service:  Completed, signed off Medicare Important Message given?  YES (If response is "NO", the following Medicare IM given date fields will be blank) Date Medicare IM given:  09/08/2014 Medicare IM given by:  Manjot Beumer Date Additional Medicare IM given:   Additional Medicare IM given by:    Discharge Disposition:  HOME/SELF CARE  Per UR Regulation:  Reviewed for med. necessity/level of care/duration of stay  If discussed at [place] of Stay Meetings, dates discussed:    Comments:  09/09/14 Ellan Lambert, RN, BSN [phone] Pt for dc home today.  Pt has no part D Rx drug benefit. Pt given 30 day free trial card for Eliquis.  Discussed importance of part D coverage; son states he plans to call [employer] today to sign pt up for part D coverage, as it is currently open enrollment for Medicare.    Son states he has this plan for $31/month, and it covers all his meds. Pt agreeable to this plan.

## 2014-09-09 NOTE — Progress Notes (Signed)
Patient ID: Darlene Harrell, female   DOB: June 25, 1941, 73 y.o.   MRN: [id number]    Subjective:  Some palpitations and dyspnea with exertion Post TEE/DCC failed   Objective:  Filed Vitals:   09/09/14 0905 09/09/14 0910 09/09/14 0920 09/09/14 0953  BP: 106/58 116/75 125/97 135/74  Pulse: 89 92 85 92  Temp:    97.9 F (36.6 C)  TempSrc:    Oral  Resp: 25 19 16 18   Height:      Weight:      SpO2: 99% 95% 96% 95%    Intake/Output from previous day:  Intake/Output Summary (Last 24 hours) at 09/09/14 1038 Last data filed at 09/09/14 0853  Gross per 24 hour  Intake    940 ml  Output   1150 ml  Net   -210 ml    Physical Exam: Affect appropriate Overweight elderly femal  HEENT: normal Neck supple with no adenopathy JVP normal no bruits no thyromegaly Lungs clear with no wheezing and good diaphragmatic motion Heart:  S1/S2 no murmur, no rub, gallop or click PMI normal Abdomen: benighn, BS positve, no tenderness, no AAA no bruit.  No HSM or HJR Distal pulses intact with no bruits Plus one bilateral edema Neuro non-focal Skin warm and dry No muscular weakness   Lab Results:   Imaging: No results found.  Cardiac Studies:  ECG:  afib nonspecific ST changes   Telemetry: afib rates 80-110  Echo: Study Conclusions  - Left ventricle: The cavity size was mildly dilated. Wall thickness was normal. The estimated ejection fraction was 40%. Diffuse hypokinesis. - Mitral valve: There was mild to moderate regurgitation. - Left atrium: The atrium was moderately dilated. - Right atrium: The atrium was moderately dilated. - Atrial septum: No defect or patent foramen ovale was identified. - Tricuspid valve: There was moderate regurgitation.  Medications:   . amiodarone  400 mg Oral BID  . apixaban  5 mg Oral BID  . atorvastatin  20 mg Oral QHS  . cholecalciferol  2,000 Units Oral Daily  . furosemide  40 mg Intravenous Daily  . glimepiride  4 mg Oral QAC breakfast  .  insulin aspart  0-9 Units Subcutaneous TID WC  . letrozole  2.5 mg Oral Daily  . off the beat book   Does not apply Once  . sodium chloride  3 mL Intravenous Q12H     . diltiazem (CARDIZEM) infusion 7.5 mg/hr (09/08/14 2330)    Assessment/Plan:  Afib:  Echo with EF 40% failed DCC x4  Start amiodarone 400 bid for a week then 200 bid  Add coreg 6.25 bid for rate control Continue eliquis Outpatient f/u with PA 3 weeks and set up [facility] on amiodarone  F/u me 6 weeks Edema: with deceased EF continue lasix add  Lisinopril 5 mg daily  DM:  SS insulin Thyroid:  TSH lower limits of normal but normal T4 Chol:  On statin   [name] 09/09/2014, 10:38 AM

## 2014-09-09 NOTE — Discharge Summary (Signed)
Physician Discharge Summary       Patient ID: Darlene Harrell MRN: [id number] DOB/AGE: 12-03-1940 73 y.o.  Admit date: 09/05/2014 Discharge date: 09/09/2014  Discharge Diagnoses:  Principal Problem:   Atrial fibrillation with RVR Active Problems:   HTN (hypertension)   Hyperlipidemia   DM (diabetes mellitus)   Atrial fibrillation   LV dysfunction   Discharged Condition: good  Procedures: 09/09/14 by Dr. Johnsie Cancel TEE and DCCV- unsuccessful DCCV.  Hospital Course: 73 y/o F with history of DM2, HTN, HL, depression, breast CA, s/p mastectomy/chemo (Dr. Marin Olp), and remote episode of atrial fibrillation in 2013 in the setting of pneumonia. At that time she was treated with cardizem and amiodarone. She was resistant to starting anticoagulation so it was not started. It was felt that her afib was likely related to acute illness but further observation was felt necessary. During that remote admission, TSH was low but free T4 was okay. Her thyroid function was more recently checked in 07/2014 - TSH was 0.20, free T4 1.20, free T3 2.5. She recently saw Dr. Marin Olp in 07/2014 who noted an irregular HR. She was advised to f/u with cardiology but unfortunately had car trouble and had to reschedule this for this month. She presented to the [facility] ED 09/05/14 with complaints of worsening SOB and fatigue over the last 2 weeks. She also reports a nonproductive cough and increasing peripheral edema. In the ER she was found to be in atrial fib with RVR and given a 10mg  IV bolus of cardizem then drip. She has no prior history of stroke or falls. She has occasional BRBPR due to hemorrhoids. She denied chest pain, nausea, vomiting, melena, fever, chills, syncope or near syncope.  Labs significant for WBC 11.3, plt 407, mildly elevated LFTs - AST 47/ALT 93. CXR shows cardiomegaly, mild interstitial edema and small right pleural effusion.  She was admitted and placed on IV Dilt, and Eliquis.  IV lasix added  for edema.  SHe was placed on SSI for her DM.  Echo revealed EF 40% and MR/TR.  Her HR was controlled.  She underwent TEE and TEE with EF 40%, No LAA thrombus, mild MR, Mod TR.    DCCV on 09/09/14 with 4 shocks with only a couple of beats of SR but then to Atrial fib.  Her rate is controlled.  Plan to begin amiodarone, continue eliquis,  Will add lisinopril 5 mg daily for LV dysfunction.    Amiodarone 400 mg BID for 1 week then 200mg  BID.  Follow up with APP in 3 weeks and Dr. Johnsie Cancel in 6 weeks.  She had prescription for 30 day free for Eliquis and script was sent to pharmacy.     Consults: None  Significant Diagnostic Studies:  BMET    Component Value Date/Time   NA 134* 09/06/2014 0526   NA 136 07/04/2014 1034   K 3.7 09/06/2014 0526   K 3.8 07/04/2014 1034   CL 96 09/06/2014 0526   CL 98 07/04/2014 1034   CO2 22 09/06/2014 0526   CO2 24 07/04/2014 1034   GLUCOSE 159* 09/06/2014 0526   GLUCOSE 213* 07/04/2014 1034   BUN 8 09/06/2014 0526   BUN 11 07/04/2014 1034   CREATININE 0.61 09/06/2014 0526   CREATININE 0.5* 07/04/2014 1034   CALCIUM 9.1 09/06/2014 0526   CALCIUM 8.9 07/04/2014 1034   GFRNONAA 89* 09/06/2014 0526   GFRAA >90 09/06/2014 0526    CBC    Component Value Date/Time   WBC  7.4 09/06/2014 0526   WBC 10.1* 07/04/2014 1034   RBC 4.06 09/06/2014 0526   RBC 4.57 07/04/2014 1034   HGB 11.1* 09/06/2014 0526   HGB 13.2 07/04/2014 1034   HCT 34.4* 09/06/2014 0526   HCT 39.8 07/04/2014 1034   PLT 308 09/06/2014 0526   PLT 359 07/04/2014 1034   MCV 84.7 09/06/2014 0526   MCV 87 07/04/2014 1034   MCH 27.3 09/06/2014 0526   MCH 28.9 07/04/2014 1034   MCHC 32.3 09/06/2014 0526   MCHC 33.2 07/04/2014 1034   RDW 14.6 09/06/2014 0526   RDW 14.6 07/04/2014 1034   LYMPHSABS 1.3 09/05/2014 1230   LYMPHSABS 1.3 07/04/2014 1034   MONOABS 1.2* 09/05/2014 1230   EOSABS 0.1 09/05/2014 1230   EOSABS 0.2 07/04/2014 1034   BASOSABS 0.0 09/05/2014 1230   BASOSABS 0.0  07/04/2014 1034   Troponin <0.30  TSH 0.20, free T4 1.20; free T3 2.5  TEE/DCC: EF 40% No LAA thrombus Mild MR Moderate TR No RAA thrombus No ASD/PFO AV sclerosis DCC x4 120 once and 200 x 3 Propofol given by anesthesia Had a couple of beats of NSR but quickly reverted to afib   PORTABLE CHEST - 1 VIEW COMPARISON: 07/04/2014 FINDINGS: The heart is enlarged. There are no focal consolidations or pleural effusions. Small right pleural effusion is noted. Suspect mild interstitial pulmonary edema. No overt alveolar edema. Surgical clips are identified in in the right axillary region. IMPRESSION: Cardiomegaly and mild interstitial edema. Small right pleural effusion   TTE Study Conclusions - Left ventricle: The cavity size was mildly dilated. Wall thickness was normal. The estimated ejection fraction was 40%. Diffuse hypokinesis. - Mitral valve: There was mild to moderate regurgitation. - Left atrium: The atrium was moderately dilated. - Right atrium: The atrium was moderately dilated. - Atrial septum: No defect or patent foramen ovale was identified. - Tricuspid valve: There was moderate regurgitation   Discharge Exam: Blood pressure 135/74, pulse 92, temperature 97.9 F (36.6 C), temperature source Oral, resp. rate 18, height 5\' 4"  (1.626 m), weight 202 lb 6.1 oz (91.8 kg), SpO2 95 %.   Disposition: 01-Home or Self Care     Medication List    STOP taking these medications        aspirin EC 81 MG tablet     lisinopril-hydrochlorothiazide 20-12.5 MG per tablet  Commonly known as:  ZESTORETIC     metoprolol tartrate 25 MG tablet  Commonly known as:  LOPRESSOR     simvastatin 40 MG tablet  Commonly known as:  ZOCOR  Replaced by:  atorvastatin 20 MG tablet      TAKE these medications        acetaminophen 325 MG tablet  Commonly known as:  TYLENOL  Take 2 tablets (650 mg total) by mouth every 4 (four) hours as needed for headache or mild pain.      amiodarone 400 MG tablet  Commonly known as:  PACERONE  Take 1 tablet (400 mg total) by mouth 2 (two) times daily. for 1 week, 09/09/14 through 09/16/14.  On 09/17/14 begin 200 mg twice a day.  Half a tablet.     apixaban 5 MG Tabs tablet  Commonly known as:  ELIQUIS  Take 1 tablet (5 mg total) by mouth 2 (two) times daily.     atorvastatin 20 MG tablet  Commonly known as:  LIPITOR  Take 1 tablet (20 mg total) by mouth at bedtime.     carvedilol 6.25 MG  tablet  Commonly known as:  COREG  Take 1 tablet (6.25 mg total) by mouth 2 (two) times daily with a meal.     clonazePAM 0.5 MG tablet  Commonly known as:  KLONOPIN  Take 1 tablet (0.5 mg total) by mouth at bedtime as needed.     furosemide 40 MG tablet  Commonly known as:  LASIX  Take 1 tablet (40 mg total) by mouth daily.     glimepiride 4 MG tablet  Commonly known as:  AMARYL  Take 1 tablet (4 mg total) by mouth daily before breakfast.     letrozole 2.5 MG tablet  Commonly known as:  FEMARA  Take 1 tablet (2.5 mg total) by mouth daily.     lisinopril 5 MG tablet  Commonly known as:  PRINIVIL  Take 1 tablet (5 mg total) by mouth daily.     metFORMIN 1000 MG tablet  Commonly known as:  GLUCOPHAGE  TAKE ONE TABLET BY MOUTH TWICE DAILY WITH  A  MEAL     nicotine polacrilex 4 MG gum  Commonly known as:  NICORETTE  Take 4 mg by mouth as needed for smoking cessation.     silver sulfADIAZINE 1 % cream  Commonly known as:  SILVADENE  Apply topically 2 (two) times daily.     Vitamin D 2000 UNITS tablet  Take 2,000 Units by mouth daily.       Follow-up Information    Follow up with [name], MD On 10/03/2014.   Specialty:  Cardiology   Why:  at 11:30 with Richardson Dopp, PA   Contact information:   [address] [address] [phone]       Follow up with [name], MD On 10/31/2014.   Specialty:  Cardiology   Why:  at 11:30 AM   Contact information:   [address] [address] [phone]        Discharge Instructions: Call if any problems or questions.   Heart Healthy Diet  You are in atrial fib.  Once the medicine is in your system, we may try cardioversion again.  We changed your appointments.  Signed: Isaiah Serge Nurse Practitioner-Certified [facility]: [facility] 09/09/2014, 1:10 PM  Time spent on discharge :  >35 minutes.

## 2014-09-09 NOTE — Anesthesia Preprocedure Evaluation (Addendum)
Anesthesia Evaluation  Patient identified by MRN, date of birth, ID band Patient awake    Reviewed: Allergy & Precautions, H&P , NPO status , Patient's Chart, lab work & pertinent test results  History of Anesthesia Complications Negative for: history of anesthetic complications  Airway Mallampati: III  TM Distance: >3 FB Neck ROM: Full    Dental  (+) Poor Dentition, Missing, Dental Advisory Given,    Pulmonary Current Smoker,          Cardiovascular hypertension, Pt. on medications + dysrhythmias Atrial Fibrillation Rhythm:Irregular Rate:Normal     Neuro/Psych negative neurological ROS     GI/Hepatic negative GI ROS, Neg liver ROS,   Endo/Other  diabetes, Type 2, Oral Hypoglycemic AgentsMorbid obesity  Renal/GU negative Renal ROS     Musculoskeletal   Abdominal   Peds  Hematology   Anesthesia Other Findings Right-sided breast cancer  Reproductive/Obstetrics negative OB ROS                            Anesthesia Physical Anesthesia Plan  ASA: III  Anesthesia Plan: General   Post-op Pain Management:    Induction: Intravenous  Airway Management Planned: Mask  Additional Equipment:   Intra-op Plan:   Post-operative Plan:   Informed Consent: I have reviewed the patients History and Physical, chart, labs and discussed the procedure including the risks, benefits and alternatives for the proposed anesthesia with the patient or authorized representative who has indicated his/her understanding and acceptance.     Plan Discussed with: CRNA and Surgeon  Anesthesia Plan Comments:         Anesthesia Quick Evaluation

## 2014-09-09 NOTE — Interval H&P Note (Signed)
History and Physical Interval Note:  09/09/2014 7:39 AM  Darlene Harrell  has presented today for surgery, with the diagnosis of afib  The various methods of treatment have been discussed with the patient and family. After consideration of risks, benefits and other options for treatment, the patient has consented to  Procedure(s): TRANSESOPHAGEAL ECHOCARDIOGRAM (TEE) (N/A) CARDIOVERSION (N/A) as a surgical intervention .  The patient's history has been reviewed, patient examined, no change in status, stable for surgery.  I have reviewed the patient's chart and labs.  Questions were answered to the patient's satisfaction.     [name]  New onset afib with decreased EF TEE/DCC indicated for restoration of NSR and atrial kick  Has had more than 5 doses of eliquis  [name]

## 2014-09-09 NOTE — Discharge Instructions (Signed)
Call if any problems or questions.   Heart Healthy Diet  You are in atrial fib.  Once the medicine is in your system, we may try cardioversion again.  We changed your appointments.  Information on my medicine - ELIQUIS (apixaban)  This medication education was reviewed with me or my healthcare representative as part of my discharge preparation.  Why was Eliquis prescribed for you? Eliquis was prescribed for you to reduce the risk of forming blood clots that can cause a stroke if you have a medical condition called atrial fibrillation (a type of irregular heartbeat) OR to reduce the risk of a blood clots forming after orthopedic surgery.  What do You need to know about Eliquis ? Take your Eliquis TWICE DAILY - one tablet in the morning and one tablet in the evening with or without food.  It would be best to take the doses about the same time each day.  If you have difficulty swallowing the tablet whole please discuss with your pharmacist how to take the medication safely.  Take Eliquis exactly as prescribed by your doctor and DO NOT stop taking Eliquis without talking to the doctor who prescribed the medication.  Stopping may increase your risk of developing a new clot or stroke.  Refill your prescription before you run out.  After discharge, you should have regular check-up appointments with your healthcare provider that is prescribing your Eliquis.  In the future your dose may need to be changed if your kidney function or weight changes by a significant amount or as you get older.  What do you do if you miss a dose? If you miss a dose, take it as soon as you remember on the same day and resume taking twice daily.  Do not take more than one dose of ELIQUIS at the same time.  Important Safety Information A possible side effect of Eliquis is bleeding. You should call your healthcare provider right away if you experience any of the following: ? Bleeding from an injury or your nose  that does not stop. ? Unusual colored urine (red or dark brown) or unusual colored stools (red or black). ? Unusual bruising for unknown reasons. ? A serious fall or if you hit your head (even if there is no bleeding).  Some medicines may interact with Eliquis and might increase your risk of bleeding or clotting while on Eliquis. To help avoid this, consult your healthcare provider or pharmacist prior to using any new prescription or non-prescription medications, including herbals, vitamins, non-steroidal anti-inflammatory drugs (NSAIDs) and supplements.  This website has more information on Eliquis (apixaban): [email or web address].no.

## 2014-09-09 NOTE — Progress Notes (Signed)
Pt discharged home with son Discharge instructions given & reviewed Eduction discussed  Pt given 30 day card for eliquis IV dc'd  Tele dc'd  Pt discharged via wheelchair with RN All pt belongs at side.

## 2014-09-09 NOTE — Anesthesia Postprocedure Evaluation (Signed)
Anesthesia Post Note  Patient: Darlene Harrell  Procedure(s) Performed: Procedure(s) (LRB): TRANSESOPHAGEAL ECHOCARDIOGRAM (TEE) (N/A) CARDIOVERSION (N/A)  Anesthesia type: general  Patient location: PACU  Post pain: Pain level controlled  Post assessment: Patient's Cardiovascular Status Stable  Last Vitals:  Filed Vitals:   09/09/14 0920  BP: 125/97  Pulse: 85  Temp:   Resp: 16    Post vital signs: Reviewed and stable  Level of consciousness: sedated  Complications: No apparent anesthesia complications

## 2014-09-09 NOTE — Progress Notes (Signed)
  Echocardiogram Echocardiogram Transesophageal has been performed.  Lysle Rubens 09/09/2014, 11:33 AM

## 2014-09-09 NOTE — CV Procedure (Signed)
TEE/DCC:  EF 40% No LAA thrombus Mild MR Moderate TR No RAA thrombus No ASD/PFO AV sclerosis  DCC x4  120 once and 200 x 3  Propofol given by anesthesia  Had a couple of beats of NSR but quickly reverted to afib  Plan:  D/C home on eliquis  Amiodarone 400 bid for a week then 200 bid  F/u PA 3 weeks and me 6 weeks for possible repeat [facility]  .[name]

## 2014-09-09 NOTE — Anesthesia Procedure Notes (Signed)
Procedure Name: MAC Date/Time: 09/09/2014 8:37 AM Performed by: Jenne Campus Pre-anesthesia Checklist: Patient identified, Emergency Drugs available, Suction available, Patient being monitored and Timeout performed Patient Re-evaluated:Patient Re-evaluated prior to inductionOxygen Delivery Method: Nasal cannula

## 2014-09-10 ENCOUNTER — Other Ambulatory Visit: Payer: Self-pay

## 2014-09-10 ENCOUNTER — Encounter (HOSPITAL_COMMUNITY): Payer: Self-pay | Admitting: Cardiovascular Disease

## 2014-09-10 ENCOUNTER — Telehealth: Payer: Self-pay | Admitting: Cardiovascular Disease

## 2014-09-10 MED ORDER — AMIODARONE HCL 200 MG PO TABS: ORAL_TABLET | ORAL | Status: DC

## 2014-09-10 NOTE — Telephone Encounter (Signed)
New messge     Pt want a presc for 200mg  of amiodarone instead of 400mg  because it is cheaper. Please call it in to [facility].  If there is a problem, call the son back. Also, why was pt switched to generic lipitor?  It is more expensive. Also, is pt still supposed to be taking 81mg  aspirin in am? (3 questions)

## 2014-09-10 NOTE — Telephone Encounter (Signed)
Son also requesting that she stay on Simvastatin due to cost.

## 2014-09-10 NOTE — Telephone Encounter (Signed)
Patient's son is requesting her Amiodarone be switched to 200mg  because he can get it cheaper at [employer]. Ordered in [place]. Unsure as to why she was switched from Simvastatin to Atorvastatin. May continue ASA 81 mg daily.

## 2014-09-19 ENCOUNTER — Ambulatory Visit: Payer: Medicare Other | Admitting: Cardiovascular Disease

## 2014-09-23 ENCOUNTER — Other Ambulatory Visit: Payer: Self-pay | Admitting: General Practice

## 2014-09-23 MED ORDER — GLIMEPIRIDE 4 MG PO TABS: 4.0000 mg | ORAL_TABLET | Freq: Every day | ORAL | Status: DC

## 2014-10-03 ENCOUNTER — Encounter: Payer: Self-pay | Admitting: Physician Assistant

## 2014-10-03 ENCOUNTER — Ambulatory Visit (INDEPENDENT_AMBULATORY_CARE_PROVIDER_SITE_OTHER): Payer: Medicare Other | Admitting: Physician Assistant

## 2014-10-03 VITALS — BP 130/80 | HR 92 | Ht 64.0 in | Wt 198.0 lb

## 2014-10-03 DIAGNOSIS — I480 Paroxysmal atrial fibrillation: Secondary | ICD-10-CM

## 2014-10-03 DIAGNOSIS — I10 Essential (primary) hypertension: Secondary | ICD-10-CM | POA: Diagnosis not present

## 2014-10-03 DIAGNOSIS — E119 Type 2 diabetes mellitus without complications: Secondary | ICD-10-CM

## 2014-10-03 DIAGNOSIS — I481 Persistent atrial fibrillation: Secondary | ICD-10-CM | POA: Diagnosis not present

## 2014-10-03 DIAGNOSIS — I429 Cardiomyopathy, unspecified: Secondary | ICD-10-CM | POA: Diagnosis not present

## 2014-10-03 DIAGNOSIS — I4819 Other persistent atrial fibrillation: Secondary | ICD-10-CM

## 2014-10-03 DIAGNOSIS — I5022 Chronic systolic (congestive) heart failure: Secondary | ICD-10-CM

## 2014-10-03 DIAGNOSIS — E785 Hyperlipidemia, unspecified: Secondary | ICD-10-CM

## 2014-10-03 DIAGNOSIS — Z79899 Other long term (current) drug therapy: Secondary | ICD-10-CM | POA: Diagnosis not present

## 2014-10-03 MED ORDER — ATORVASTATIN CALCIUM 20 MG PO TABS: 20.0000 mg | ORAL_TABLET | Freq: Every day | ORAL | Status: DC

## 2014-10-03 MED ORDER — CARVEDILOL 6.25 MG PO TABS: 9.3750 mg | ORAL_TABLET | Freq: Two times a day (BID) | ORAL | Status: DC

## 2014-10-03 NOTE — Progress Notes (Signed)
Cardiology Office Note   Date:  10/03/2014   ID:  Darlene Harrell, DOB [birth year], MRN [id number]  PCP:  Annye Asa, MD  Cardiologist:  Dr. [name]     History of Present Illness: Darlene Harrell is a 73 y.o. female with a hx of DM2, HTN, HL, breast CA s/p mastectomy and chemotherapy.  She developed PAF with RVR in 2013.  She converted to NSR with Amiodarone.  Amiodarone was stopped as it was felt AFib was due to the acute illness.    She was admitted 11/6-11/10 with acute systolic CHF in the setting of atrial fibrillation with RVR. She was diuresed with IV Lasix. She was placed on Eliquis for anticoagulation. She underwent an echocardiogram which demonstrated reduced LV function with an EF of 40%. This was felt to be related to tachycardia. She underwent TEE guided cardioversion. This was unsuccessful. She was therefore placed on amiodarone. Medications were adjusted to include beta blocker and ACEI. Plan is to pursue cardioversion on amiodarone after 3 weeks of uninterrupted anticoagulation. Of note, her statin was adjusted from simvastatin to atorvastatin due to amiodarone use. She returns for follow-up.  She is here with her son. She is doing much better. She continues to note dyspnea with some activities. She is NYHA 2b. She denies chest pain, syncope, orthopnea, PND or edema. Weights have been stable. She denies fever cough. She denies any bleeding problems. Of note, she plans to travel to [facility] 12/9. She will be there until 11/19/14. Her daughter is close to delivering.  Studies:   - Echo (5/13):  EF 60-65%  - Echo (11/15):  EF 40%, mild to mod MR, mod BAE, mod TR.  - TEE (11/15):  EF 40%, no LAA clot, mild MR, mod TR, no RAA clost   Recent Labs: 09/05/2014: TSH 0.336* 09/06/2014: ALT 77*; BUN 8; Creatinine 0.61; Hemoglobin 11.1*; LDL (calc) 44; Potassium 3.7; Sodium 134*    Recent Radiology: Dg Chest Port 1 View  09/05/2014   CLINICAL DATA:  Shortness of breath that  worsens with walking. Atrial fibrillation. History of smoking and breast cancer. History of diabetes and right mastectomy.  EXAM: PORTABLE CHEST - 1 VIEW  COMPARISON:  07/04/2014  FINDINGS: The heart is enlarged. There are no focal consolidations or pleural effusions. Small right pleural effusion is noted. Suspect mild interstitial pulmonary edema. No overt alveolar edema. Surgical clips are identified in in the right axillary region.  IMPRESSION: Cardiomegaly and mild interstitial edema.  Small right pleural effusion.   Electronically Signed   By: Shon Hale M.D.   On: 09/05/2014 13:20      Wt Readings from Last 3 Encounters:  10/03/14 198 lb (89.812 kg)  09/09/14 202 lb 6.1 oz (91.8 kg)  09/05/14 209 lb 9.6 oz (95.074 kg)     Past Medical History  Diagnosis Date  . Breast cancer     a. s/p mastectomy/chemo, Dr. Marin Olp.  . Diabetes mellitus   . Hypertension   . Depression   . Syncopal episodes   . Arthritis   . High cholesterol   . Blood in stool     a. due to hemorrhoids.  . Paroxysmal atrial fibrillation     a. In setting of Pneumonia 6/13 - Echocardiogram 03/30/12: Mild LVH, normal LV function, EF 60-65%, no wall motion abnormalities, mild MR, mild LAE, PASP 32.  . LV dysfunction 09/09/2014    Current Outpatient Prescriptions  Medication Sig Dispense Refill  . acetaminophen (TYLENOL) 325 MG tablet  Take 2 tablets (650 mg total) by mouth every 4 (four) hours as needed for headache or mild pain.    Marland Kitchen amiodarone (PACERONE) 200 MG tablet 2 tablets bid x 1 week, then 1 tab bid therafter/ son requests 70 tablet 6  . apixaban (ELIQUIS) 5 MG TABS tablet Take 1 tablet (5 mg total) by mouth 2 (two) times daily. 60 tablet 0  . carvedilol (COREG) 6.25 MG tablet Take 1 tablet (6.25 mg total) by mouth 2 (two) times daily with a meal. 60 tablet 6  . Cholecalciferol (VITAMIN D) 2000 UNITS tablet Take 2,000 Units by mouth daily.    . clonazePAM (KLONOPIN) 0.5 MG tablet Take 1 tablet (0.5 mg  total) by mouth at bedtime as needed. (Patient taking differently: Take 0.5 mg by mouth at bedtime as needed (sleep). ) 90 tablet 1  . furosemide (LASIX) 40 MG tablet Take 1 tablet (40 mg total) by mouth daily. 30 tablet 6  . glimepiride (AMARYL) 4 MG tablet Take 1 tablet (4 mg total) by mouth daily before breakfast. 90 tablet 1  . letrozole (FEMARA) 2.5 MG tablet Take 1 tablet (2.5 mg total) by mouth daily. 180 tablet 0  . lisinopril (PRINIVIL,ZESTRIL) 5 MG tablet Take 1 tablet (5 mg total) by mouth daily. 30 tablet 6  . metFORMIN (GLUCOPHAGE) 1000 MG tablet TAKE ONE TABLET BY MOUTH TWICE DAILY WITH  A  MEAL 180 tablet 1  . nicotine polacrilex (NICORETTE) 4 MG gum Take 4 mg by mouth as needed for smoking cessation.    . silver sulfADIAZINE (SILVADENE) 1 % cream Apply topically 2 (two) times daily. 50 g 0  . simvastatin (ZOCOR) 40 MG tablet Take 40 mg by mouth daily at 6 PM.    . atorvastatin (LIPITOR) 20 MG tablet Take 1 tablet (20 mg total) by mouth at bedtime. (Patient not taking: Reported on 10/03/2014) 30 tablet 6   No current facility-administered medications for this visit.     Allergies:   Review of patient's allergies indicates no known allergies.   Social History:  The patient  reports that she has been smoking Cigarettes.  She started smoking about 54 years ago. She has a 48 pack-year smoking history. She has never used smokeless tobacco. She reports that she does not drink alcohol or use illicit drugs.   Family History:  The patient's family history includes Alcohol abuse in her father; Diabetes in her father, mother, and son; Early death in her son; Heart disease in her mother; Hyperlipidemia in her son; Hypertension in her father and mother; Kidney failure in her son; Mental retardation in her mother; Stroke in her mother.    ROS:  Please see the history of present illness.      All other systems reviewed and negative.    PHYSICAL EXAM: VS:  BP 130/80 mmHg  Pulse 92  Ht 5\' 4"   (1.626 m)  Wt 198 lb (89.812 kg)  BMI 33.97 kg/m2 Well nourished, well developed, in no acute distress HEENT: normal Neck: no JVD Cardiac:  normal S1, S2; irregularly irregular rhythm; no murmur   Lungs:   clear to auscultation bilaterally, no wheezing, rhonchi or rales Abd: soft, nontender, no hepatomegaly Ext: no edema Skin: warm and dry Neuro:  CNs 2-12 intact, no focal abnormalities noted  EKG:  Atrial fibrillation, HR 92      ASSESSMENT AND PLAN:  No diagnosis found.  1.  Persistent Atrial Fibrillation:  Patient remains in atrial fibrillation. She remains on amiodarone  as well as Eliquis. She admits to compliance with Eliquis. She understands the importance of uninterrupted anticoagulation. Plan today was to proceed with cardioversion if she remained in atrial fibrillation. However, she is planning to travel to [place] in several days. She is overall stable. Heart rate is controlled. Volume status is stable. At this point, I am not certain that proceeding with cardioversion at this time is indicated. We should wait until she returns from her trip.    -  Continue current dose of amiodarone 200 mg twice a day.    -  Continue Eliquis 5 mg twice a day. Patient assistance paperwork has been completed today.    -  Increase carvedilol to 9.375 mg twice a day for improved rate control.    -  Patient will be given a prescription to take to an urgent care or doctor's office in about 2 weeks to obtain blood pressure, heart rate, BMET, CBC, TSH and LFTs.    -  We'll plan DCCV when she returns for follow-up if she remains in atrial fibrillation. 2.  Cardiomyopathy: This is likely related to tachycardia.    -  Continue ACEI, beta blocker. 3.  Chronic Systolic CHF:  Volume remain stable. Continue current dose of furosemide.    -  Check BMET today. 4. Hypertension: Controlled. 5. Hyperlipidemia:  The patient did not realize that she was to change to Lipitor. She has remained on simvastatin 40 mg.       -  DC simvastatin    -  Start atorvastatin 20 mg daily.    -  Arrange follow-up lipids and LFTs when she returns in follow-up. 6. Diabetes Mellitus:  08/25/2014: Hemoglobin-A1c 7.9* FU with PCP.  Disposition:   FU with Dr. [name] or me after she returns from [facility] 11/19/14.   Signed, Versie Starks, MHS 10/03/2014 12:01 PM    [facility] [place], [place], [place]  [address] Phone: [phone]; Fax: [phone]

## 2014-10-03 NOTE — Patient Instructions (Signed)
LAB WORK TODAY; BMET  STOP SIMVASTATIN  START LIPITOR 20 MG 1 TABLET AT BEDTIME EVERY NIGHT  INCREASE COREG TO 9.375 MG TWICE DAILY; THIS WILL BE ( 1 AND 1/2 TABS TWICE DAILY)  YOU HAVE BEEN GIVEN AN RX FOR LAB WORK TO BE DONE WHILE ON YOUR TRIP BMET, CBC, TSH, LFT, ALSO WITH A BLOOD PRESSURE CHECK AND PULSE RATE TO BE FAXED OVER TO [place], [place]  WE WILL CANCEL YOUR APPT ON 11/04/13 AND RESCHEDULE TO A DAY AFTER 11/19/13 WITH DR. Johnsie Cancel

## 2014-10-05 LAB — BASIC METABOLIC PANEL
BUN: 20 mg/dL (ref 6–23)
CO2: 26 meq/L (ref 19–32)
Calcium: 9.4 mg/dL (ref 8.4–10.5)
Chloride: 102 mEq/L (ref 96–112)
Creatinine, Ser: 0.9 mg/dL (ref 0.4–1.2)
GFR: 64.75 mL/min (ref >60.00)
Glucose, Bld: 125 mg/dL — ABNORMAL HIGH (ref 70–99)
POTASSIUM: 4.2 meq/L (ref 3.5–5.1)
SODIUM: 140 meq/L (ref 135–145)

## 2014-10-07 ENCOUNTER — Telehealth: Payer: Self-pay | Admitting: Family Medicine

## 2014-10-07 NOTE — Telephone Encounter (Signed)
Caller name: [place] Relation to pt: self Call back number: [phone] Pharmacy: [facility] on [address].  Reason for call:   Patient would like to know if dr Birdie Riddle would refill klonopin for 30 day supply. She says that she is not due as of yet for refill but is trying to get financial assistance to help pay for medications and is short $3-$4 to meet the requirement. She would like a callback about this.

## 2014-10-07 NOTE — Telephone Encounter (Signed)
Last OV 08/26/14 Clonazepam last filled 08/13/14 #90 with 1

## 2014-10-07 NOTE — Telephone Encounter (Signed)
Ok for #30, no refills 

## 2014-10-08 MED ORDER — CLONAZEPAM 0.5 MG PO TABS: 0.5000 mg | ORAL_TABLET | Freq: Every evening | ORAL | Status: DC | PRN

## 2014-10-08 NOTE — Telephone Encounter (Signed)
Med filled and faxed.  

## 2014-10-14 DIAGNOSIS — R030 Elevated blood-pressure reading, without diagnosis of hypertension: Secondary | ICD-10-CM | POA: Diagnosis not present

## 2014-10-14 DIAGNOSIS — R000 Tachycardia, unspecified: Secondary | ICD-10-CM | POA: Diagnosis not present

## 2014-10-14 DIAGNOSIS — F17210 Nicotine dependence, cigarettes, uncomplicated: Secondary | ICD-10-CM | POA: Diagnosis present

## 2014-10-14 DIAGNOSIS — I482 Chronic atrial fibrillation: Secondary | ICD-10-CM | POA: Diagnosis not present

## 2014-10-14 DIAGNOSIS — I5023 Acute on chronic systolic (congestive) heart failure: Secondary | ICD-10-CM | POA: Diagnosis present

## 2014-10-14 DIAGNOSIS — I501 Left ventricular failure: Secondary | ICD-10-CM | POA: Diagnosis not present

## 2014-10-14 DIAGNOSIS — I4891 Unspecified atrial fibrillation: Secondary | ICD-10-CM | POA: Diagnosis not present

## 2014-10-14 DIAGNOSIS — Z9010 Acquired absence of unspecified breast and nipple: Secondary | ICD-10-CM | POA: Diagnosis present

## 2014-10-14 DIAGNOSIS — G4700 Insomnia, unspecified: Secondary | ICD-10-CM | POA: Diagnosis present

## 2014-10-14 DIAGNOSIS — E785 Hyperlipidemia, unspecified: Secondary | ICD-10-CM | POA: Diagnosis present

## 2014-10-14 DIAGNOSIS — I110 Hypertensive heart disease with heart failure: Secondary | ICD-10-CM | POA: Diagnosis not present

## 2014-10-14 DIAGNOSIS — I2510 Atherosclerotic heart disease of native coronary artery without angina pectoris: Secondary | ICD-10-CM | POA: Diagnosis not present

## 2014-10-14 DIAGNOSIS — E119 Type 2 diabetes mellitus without complications: Secondary | ICD-10-CM | POA: Diagnosis not present

## 2014-10-14 DIAGNOSIS — I509 Heart failure, unspecified: Secondary | ICD-10-CM | POA: Diagnosis not present

## 2014-10-14 DIAGNOSIS — Z853 Personal history of malignant neoplasm of breast: Secondary | ICD-10-CM | POA: Diagnosis not present

## 2014-10-14 DIAGNOSIS — R079 Chest pain, unspecified: Secondary | ICD-10-CM | POA: Diagnosis not present

## 2014-10-14 DIAGNOSIS — I481 Persistent atrial fibrillation: Secondary | ICD-10-CM | POA: Diagnosis not present

## 2014-10-16 ENCOUNTER — Telehealth: Payer: Self-pay | Admitting: *Deleted

## 2014-10-16 NOTE — Telephone Encounter (Signed)
Patient Assistance Program application for Eliquis faxed to [employer].

## 2014-10-27 NOTE — Telephone Encounter (Signed)
Patient approved for Eliquis through [employer].

## 2014-11-01 DIAGNOSIS — I2510 Atherosclerotic heart disease of native coronary artery without angina pectoris: Secondary | ICD-10-CM | POA: Diagnosis not present

## 2014-11-01 DIAGNOSIS — Z79899 Other long term (current) drug therapy: Secondary | ICD-10-CM | POA: Diagnosis not present

## 2014-11-01 DIAGNOSIS — I480 Paroxysmal atrial fibrillation: Secondary | ICD-10-CM | POA: Diagnosis not present

## 2014-11-01 DIAGNOSIS — I10 Essential (primary) hypertension: Secondary | ICD-10-CM | POA: Diagnosis not present

## 2014-11-03 ENCOUNTER — Encounter: Payer: Self-pay | Admitting: Physician Assistant

## 2014-11-03 DIAGNOSIS — I482 Chronic atrial fibrillation: Secondary | ICD-10-CM | POA: Diagnosis not present

## 2014-11-03 DIAGNOSIS — I5189 Other ill-defined heart diseases: Secondary | ICD-10-CM | POA: Diagnosis not present

## 2014-11-03 DIAGNOSIS — I447 Left bundle-branch block, unspecified: Secondary | ICD-10-CM | POA: Diagnosis not present

## 2014-11-04 ENCOUNTER — Encounter: Payer: Medicare Other | Admitting: Cardiovascular Disease

## 2014-11-04 ENCOUNTER — Telehealth: Payer: Self-pay | Admitting: Physician Assistant

## 2014-11-04 NOTE — Telephone Encounter (Signed)
New Msg         Pt returning call she states she received today to contact office before 5pm.     Please return call.

## 2014-11-04 NOTE — Telephone Encounter (Signed)
Pt notified about lab results with verbal understanding 

## 2014-11-24 NOTE — Progress Notes (Signed)
Patient ID: Darlene Harrell, female   DOB: May 05, 1941, 74 y.o.   MRN: [id number]    Cardiology Office Note   Date:  11/25/2014   ID:  Darlene Harrell, DOB 04/21/1941, MRN [id number]  PCP:  Annye Asa, MD  Cardiologist:  Dr. [name]     History of Present Illness: Darlene Harrell is a 74 y.o. female with a hx of DM2, HTN, HL, breast CA s/p mastectomy and chemotherapy.  She developed PAF with RVR in 2013.  She converted to NSR with Amiodarone.  Amiodarone was stopped as it was felt AFib was due to the acute illness.    She was admitted 11/6-11/10 with acute systolic CHF in the setting of atrial fibrillation with RVR. She was diuresed with IV Lasix. She was placed on Eliquis for anticoagulation. She underwent an echocardiogram which demonstrated reduced LV function with an EF of 40%. This was felt to be related to tachycardia. She underwent TEE guided cardioversion 09/09/14 . This was unsuccessful. She was therefore placed on amiodarone. Medications were adjusted to include beta blocker and ACEI. Plan is to pursue cardioversion on amiodarone after 3 weeks of uninterrupted anticoagulation. Of note, her statin was adjusted from simvastatin to atorvastatin due to amiodarone use. She returns for follow-up.  She went to [facility] in December 12/18   and [facility] was delayed.  Reviewed 40 pages of records  Coreg increased last month by PA  Hospitalized at [place]  [place] Cath with no significant CAD  Aldactone added  EF by V gram 25-30%  Estimated at 30-35% by echo with moderate LAE  For some reason her amiodarone was stopped  Discussed issues of DCM and repeat cardioversion She needs to be on antiarrhythmic as she failed previous  In [place] has abnormal labs and these may have been worse in [place] prompting stopping amiodarone  TSH .336 nomal T4 AST 47 ALT 93  Cr .61   Studies: Reviewed     - Echo (5/13):  EF 60-65%  - Echo (11/15):  EF 40%, mild to mod MR, mod BAE, mod TR.  - TEE (11/15):  EF 40%, no LAA clot, mild MR, mod TR, no RAA clost   Recent Labs: 09/05/2014: TSH 0.336* 09/06/2014: ALT 77*; Hemoglobin 11.1*; LDL (calc) 44 10/03/2014: BUN 20; Creatinine 0.9; Potassium 4.2; Sodium 140    Recent Radiology: No results found.    Wt Readings from Last 3 Encounters:  11/25/14 89.812 kg (198 lb)  10/03/14 89.812 kg (198 lb)  09/09/14 91.8 kg (202 lb 6.1 oz)     Past Medical History  Diagnosis Date  . Breast cancer     a. s/p mastectomy/chemo, Dr. Marin Olp.  . Diabetes mellitus   . Hypertension   . Depression   . Syncopal episodes   . Arthritis   . High cholesterol   . Blood in stool     a. due to hemorrhoids.  . Paroxysmal atrial fibrillation     a. In setting of Pneumonia 6/13 - Echocardiogram 03/30/12: Mild LVH, normal LV function, EF 60-65%, no wall motion abnormalities, mild MR, mild LAE, PASP 32.  . LV dysfunction 09/09/2014    Current Outpatient Prescriptions  Medication Sig Dispense Refill  . acetaminophen (TYLENOL) 325 MG tablet Take 2 tablets (650 mg total) by mouth every 4 (four) hours as needed for headache or mild pain.    Marland Kitchen apixaban (ELIQUIS) 5 MG TABS tablet Take 1 tablet (5 mg total) by mouth 2 (two) times daily. [place]  tablet 0  . atorvastatin (LIPITOR) 20 MG tablet Take 1 tablet (20 mg total) by mouth daily. 90 tablet 3  . carvedilol (COREG) 12.5 MG tablet Take 12.5 mg by mouth 2 (two) times daily with a meal.    . Cholecalciferol (VITAMIN D) 2000 UNITS tablet Take 2,000 Units by mouth daily.    . clonazePAM (KLONOPIN) 0.5 MG tablet Take 1 tablet (0.5 mg total) by mouth at bedtime as needed. 30 tablet 0  . furosemide (LASIX) 40 MG tablet Take 1 tablet (40 mg total) by mouth daily. (Patient taking differently: Take 40 mg by mouth daily as needed. ) 30 tablet 6  . glimepiride (AMARYL) 4 MG tablet Take 1 tablet (4 mg total) by mouth daily before breakfast. 90 tablet 1  . letrozole (FEMARA) 2.5 MG tablet Take 1 tablet (2.5 mg total) by  mouth daily. 180 tablet 0  . lisinopril (PRINIVIL,ZESTRIL) 10 MG tablet Take 10 mg by mouth daily.    . metFORMIN (GLUCOPHAGE) 1000 MG tablet TAKE ONE TABLET BY MOUTH TWICE DAILY WITH  A  MEAL 180 tablet 1  . nicotine polacrilex (NICORETTE) 4 MG gum Take 4 mg by mouth as needed for smoking cessation.    Marland Kitchen spironolactone (ALDACTONE) 25 MG tablet Take 25 mg by mouth daily.     No current facility-administered medications for this visit.     Allergies:   Review of patient's allergies indicates no known allergies.   Social History:  The patient  reports that she has been smoking Cigarettes.  She started smoking about 54 years ago. She has a 48 pack-year smoking history. She has never used smokeless tobacco. She reports that she does not drink alcohol or use illicit drugs.   Family History:  The patient's family history includes Alcohol abuse in her father; Diabetes in her father, mother, and son; Early death in her son; Heart disease in her mother; Hyperlipidemia in her son; Hypertension in her father and mother; Kidney failure in her son; Mental retardation in her mother; Stroke in her mother.    ROS:  Please see the history of present illness.      All other systems reviewed and negative.    PHYSICAL EXAM: VS:  BP 110/66 mmHg  Pulse 119  Ht 5\' 4"  (1.626 m)  Wt 89.812 kg (198 lb)  BMI 33.97 kg/m2  SpO2 98% Well nourished, well developed, in no acute distress HEENT: normal Neck: no JVD Cardiac:  normal S1, S2; irregularly irregular rhythm; no murmur   Lungs:   clear to auscultation bilaterally, no wheezing, rhonchi or rales Abd: soft, nontender, no hepatomegaly Ext: no edema Skin: warm and dry Neuro:  CNs 2-12 intact, no focal abnormalities noted  EKG:  Atrial fibrillation, HR 92   12/15  11/25/14  afib rate 120  ICLBBB     ASSESSMENT AND PLAN:  Atrial fibrillation, unspecified - Plan: Basic metabolic panel, TSH, T4, free, Hepatic function panel  1.  Persistent Atrial  Fibrillation:  Patient remains in atrial fibrillation.  Amiodarone stopped will recheck TSH and LFTls  Amiodarone and tikosyn best options For antiarrhtymics no CAD severe decrease in EF  Will arrange for her to see EP ? Admit for tikosyn load and [facility] after 5 doses continue coreg for rate Control and eliquis for anticoagulation  Increase coreg to 25 bid   2.  Cardiomyopathy: This is likely related to tachycardia. No CAD cath in [place]     -  Continue ACEI, and beta blocker  3.  Chronic Systolic CHF:  Volume remain stable. Continue current dose of furosemide. Aldactone added recently     -  Check BMET today. 4. Hypertension: Controlled. 5. Hyperlipidemia:  The patient did not realize that she was to change to Lipitor. She has remained on simvastatin 40 mg.      -  DC simvastatin    -  Start atorvastatin 20 mg daily. 6. Diabetes Mellitus:  08/25/2014: Hemoglobin-A1c 7.9* FU with PCP.

## 2014-11-25 ENCOUNTER — Ambulatory Visit (INDEPENDENT_AMBULATORY_CARE_PROVIDER_SITE_OTHER): Payer: Medicare Other | Admitting: Cardiovascular Disease

## 2014-11-25 ENCOUNTER — Encounter: Payer: Self-pay | Admitting: Cardiovascular Disease

## 2014-11-25 ENCOUNTER — Telehealth: Payer: Self-pay | Admitting: Cardiovascular Disease

## 2014-11-25 VITALS — BP 110/66 | HR 119 | Ht 64.0 in | Wt 198.0 lb

## 2014-11-25 DIAGNOSIS — I4891 Unspecified atrial fibrillation: Secondary | ICD-10-CM | POA: Diagnosis not present

## 2014-11-25 LAB — HEPATIC FUNCTION PANEL
ALK PHOS: 65 U/L (ref 39–117)
ALT: 24 U/L (ref 0–35)
AST: 13 U/L (ref 0–37)
Albumin: 3.8 g/dL (ref 3.5–5.2)
BILIRUBIN TOTAL: 0.6 mg/dL (ref 0.2–1.2)
Bilirubin, Direct: 0.2 mg/dL (ref 0.0–0.3)
Total Protein: 7 g/dL (ref 6.0–8.3)

## 2014-11-25 LAB — BASIC METABOLIC PANEL
BUN: 17 mg/dL (ref 6–23)
CHLORIDE: 103 meq/L (ref 96–112)
CO2: 22 meq/L (ref 19–32)
Calcium: 9.4 mg/dL (ref 8.4–10.5)
Creatinine, Ser: 0.95 mg/dL (ref 0.40–1.20)
GFR: 61.59 mL/min (ref >60.00)
Glucose, Bld: 200 mg/dL — ABNORMAL HIGH (ref 70–99)
Potassium: 4.4 mEq/L (ref 3.5–5.1)
Sodium: 135 mEq/L (ref 135–145)

## 2014-11-25 LAB — TSH: TSH: 1.26 u[IU]/mL (ref 0.35–4.50)

## 2014-11-25 LAB — T4, FREE: Free T4: 1.12 ng/dL (ref 0.60–1.60)

## 2014-11-25 MED ORDER — CARVEDILOL 25 MG PO TABS: 25.0000 mg | ORAL_TABLET | Freq: Two times a day (BID) | ORAL | Status: DC

## 2014-11-25 NOTE — Patient Instructions (Addendum)
Your physician recommends that you schedule a follow-up appointment in:   [place]  START   Your physician recommends that you return for lab work in:  TODAY   BMET   LIVER  TSH  FREE T4  Your physician has recommended you make the following change in your medication: INCREASE  CARVEDILOL  25 MG  TWICE  DAILY

## 2014-11-27 ENCOUNTER — Encounter: Payer: Self-pay | Admitting: Internal Medicine

## 2014-11-27 ENCOUNTER — Ambulatory Visit (INDEPENDENT_AMBULATORY_CARE_PROVIDER_SITE_OTHER): Payer: Medicare Other | Admitting: Internal Medicine

## 2014-11-27 VITALS — BP 96/72 | HR 104 | Ht 64.0 in | Wt 188.1 lb

## 2014-11-27 DIAGNOSIS — I4891 Unspecified atrial fibrillation: Secondary | ICD-10-CM

## 2014-11-27 DIAGNOSIS — I10 Essential (primary) hypertension: Secondary | ICD-10-CM

## 2014-11-27 DIAGNOSIS — I428 Other cardiomyopathies: Secondary | ICD-10-CM

## 2014-11-27 DIAGNOSIS — I429 Cardiomyopathy, unspecified: Secondary | ICD-10-CM

## 2014-11-27 NOTE — Patient Instructions (Signed)
Your physician recommends that you schedule a follow-up appointment in: 3 weeks with Roderic Palau, NP

## 2014-11-27 NOTE — Progress Notes (Signed)
Electrophysiology Office Note   Date:  11/27/2014   ID:  Darlene Harrell, DOB 1941/04/07, MRN [id number]  PCP:  Annye Asa, MD  Cardiologist:  Dr Johnsie Cancel Primary Electrophysiologist:  Thompson Grayer, MD    Chief Complaint  Patient presents with  . Appointment    AFIB     History of Present Illness: Darlene Harrell is a 74 y.o. female who presents today for electrophysiology evaluation.   She reports having an episode of atrial fibrillation in 2013 in the setting of CHF/ pneumonia.  She did not require cardioversion at that time but was temporarily treated with amiodarone (which she tolerated).  After several months, amiodarone was discontinued.  She is unaware of any further afib until 10/15 when she presented to Dr Antonieta Pert office and was found to have again returned to afib.  She developed progressive shortness of breath and was admitted to [facility] with acute systolic CHF in the setting of atrial fibrillation with RVR. She was diuresed with IV Lasix. She was placed on Eliquis for anticoagulation.  She underwent cardioversion which was unsuccessful 09/09/14.  She was initiated on amiodarone.  Plan was to pursue cardioversion on amiodarone after 3 weeks of uninterrupted anticoagulation.  She went to [facility] in December 12/15and [facility] was delayed. She developed worsening CHF and was therefore hospitalized at [facility] in [place].  She underwent cath which revealed no significant CAD, EF by V gram 25-30% Estimated at 30-35% by echo with moderate LAE.  Aldactone was added and for some reason her amiodarone was stopped.  She reports having a sleep study 12/15 in [place] and she did not have sleep apnea.   Today, she denies symptoms of palpitations, chest pain, shortness of breath, orthopnea, PND, lower extremity edema, claudication, dizziness, presyncope, syncope, bleeding, or neurologic sequela. The patient is tolerating medications without difficulties and is otherwise  without complaint today.    Past Medical History  Diagnosis Date  . Breast cancer 2010    a. s/p mastectomy/chemo/XRT to R breast, Dr. Marin Olp.  . Diabetes mellitus   . Hypertension   . Depression   . Syncopal episodes   . Arthritis   . High cholesterol   . Blood in stool     a. due to hemorrhoids.  . Persistent atrial fibrillation     a. In setting of Pneumonia 6/13 - Echocardiogram 03/30/12: Mild LVH, normal LV function, EF 60-65%, no wall motion abnormalities, mild MR, mild LAE, PASP 32.  . LV dysfunction 09/09/2014    EF40%  . Overweight    Past Surgical History  Procedure Laterality Date  . Tonsillectomy    . Breast surgery  2010 and 2011     biopsy and noted mastectomy  . Tee without cardioversion N/A 09/09/2014    Procedure: TRANSESOPHAGEAL ECHOCARDIOGRAM (TEE);  Surgeon: Josue Hector, MD;  Location: [facility] ENDOSCOPY;  Service: Cardiovascular;  Laterality: N/A;  . Cardioversion N/A 09/09/2014    Procedure: CARDIOVERSION;  Surgeon: Josue Hector, MD;  Location: [facility] ENDOSCOPY;  Service: Cardiovascular;  Laterality: N/A;     Current Outpatient Prescriptions  Medication Sig Dispense Refill  . apixaban (ELIQUIS) 5 MG TABS tablet Take 1 tablet (5 mg total) by mouth 2 (two) times daily. 60 tablet 0  . atorvastatin (LIPITOR) 20 MG tablet Take 1 tablet (20 mg total) by mouth daily. 90 tablet 3  . carvedilol (COREG) 25 MG tablet Take 1 tablet (25 mg total) by mouth 2 (two) times daily with a meal. 60  tablet 11  . Cholecalciferol (VITAMIN D) 2000 UNITS tablet Take 2,000 Units by mouth daily.    . clonazePAM (KLONOPIN) 0.5 MG tablet Take 1 tablet (0.5 mg total) by mouth at bedtime as needed. (Patient taking differently: Take 0.5 mg by mouth at bedtime as needed (sleep). ) 30 tablet 0  . furosemide (LASIX) 40 MG tablet Take 1 tablet (40 mg total) by mouth daily. (Patient taking differently: Take 40 mg by mouth daily as needed. ) 30 tablet 6  . glimepiride (AMARYL) 4 MG tablet Take 1  tablet (4 mg total) by mouth daily before breakfast. 90 tablet 1  . ibuprofen (ADVIL,MOTRIN) 200 MG tablet Take 200 mg by mouth every 6 (six) hours as needed (pain).    Marland Kitchen letrozole (FEMARA) 2.5 MG tablet Take 1 tablet (2.5 mg total) by mouth daily. 180 tablet 0  . lisinopril (PRINIVIL,ZESTRIL) 10 MG tablet Take 10 mg by mouth daily.    . metFORMIN (GLUCOPHAGE) 1000 MG tablet TAKE ONE TABLET BY MOUTH TWICE DAILY WITH  A  MEAL 180 tablet 1  . nicotine polacrilex (NICORETTE) 4 MG gum Take 4 mg by mouth daily as needed for smoking cessation.     Marland Kitchen spironolactone (ALDACTONE) 25 MG tablet Take 25 mg by mouth daily.     No current facility-administered medications for this visit.    Allergies:   Review of patient's allergies indicates no known allergies.   Social History:  The patient  reports that she has been smoking Cigarettes.  She started smoking about 54 years ago. She has a 48 pack-year smoking history. She has never used smokeless tobacco. She reports that she does not drink alcohol or use illicit drugs.   Family History:  The patient's family history includes Alcohol abuse in her father; Diabetes in her father, mother, and son; Early death in her son; Heart disease in her mother; Hyperlipidemia in her son; Hypertension in her father and mother; Kidney failure in her son; Mental retardation in her mother; Stroke in her mother.    ROS:  Please see the history of present illness.   All other systems are reviewed and negative.    PHYSICAL EXAM: VS:  BP 96/72 mmHg  Pulse 104  Ht 5\' 4"  (1.626 m)  Wt 188 lb 1.9 oz (85.331 kg)  BMI 32.27 kg/m2 , BMI Body mass index is 32.27 kg/(m^2). GEN: overweight, well developed, in no acute distress HEENT: normal Neck: no JVD, carotid bruits, or masses Cardiac: iRRR; no murmurs, rubs, or gallops,no edema  Respiratory:  clear to auscultation bilaterally, normal work of breathing GI: soft, nontender, nondistended, + BS MS: no deformity or atrophy Skin:  warm and dry  Neuro:  Strength and sensation are intact Psych: euthymic mood, full affect  EKG:  EKG is ordered today. The ekg ordered today shows afib with V rate 118, incomplete LBBB (QRS 134) which has progressed since December.    Recent Labs: 09/05/2014: Magnesium 1.8 09/06/2014: Hemoglobin 11.1*; Platelets 308 11/25/2014: ALT 24; BUN 17; Creatinine 0.95; Potassium 4.4; Sodium 135; TSH 1.26    Lipid Panel     Component Value Date/Time   CHOL 88 09/06/2014 0526   TRIG 89 09/06/2014 0526   HDL 26* 09/06/2014 0526   CHOLHDL 3.4 09/06/2014 0526   VLDL 18 09/06/2014 0526   LDLCALC 44 09/06/2014 0526     Wt Readings from Last 3 Encounters:  11/27/14 188 lb 1.9 oz (85.331 kg)  11/25/14 198 lb (89.812 kg)  10/03/14 198  lb (89.812 kg)      Other studies Reviewed: Additional studies/ records that were reviewed today include: Dr Mariana Arn note, prior echo, and records from OSH  Review of the above records today demonstrates: as per HPI, in additon, EF is reduced with moderate LA enlargement   ASSESSMENT AND PLAN:  1.  Persistent atrial fibrillation The patient has persistent afib with elevated V rates and associated CHF.  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 Therapeutic strategies for afib including rate control and rhyhthm were discussed in detail with the patient today.  Her amiodarone was discontinued for reasons that are not clear.  We may have to eventually try this medicine again. Given reduced EF and AF, I would also like to expore GENETIC AF study as an option.  I will have my research team evaluate.  She is willing to participate if she is a candidate.  Our other alternative would be tikosyn once her amiodarone level is appropriately low.  I will order an amiodarone level today. Given persistent afib, I would recommend medical therapy over ablation at this time. chads2vasc score is 5.  Continue anticoagulation long term. This  patients CHA2DS2-VASc Score and unadjusted Ischemic Stroke Rate (% per year) is equal to 7.2 % stroke rate/year from a score of 5  Above score calculated as 1 point each if present [CHF, HTN, DM, Vascular=MI/PAD/Aortic Plaque, Age if 65-74, or Female] Above score calculated as 2 points each if present [Age > 75, or Stroke/TIA/TE]   2. Nonischemic CM/ CHF Likely due to afib as above I think that she would do better with sinus rhythm GENETIC AF study evaluation as above I am limited by hypotension on further rate control today, though V rates are elevated.  3. HTN BP is low today No changes    Current medicines are reviewed at length with the patient today.   The patient does not have concerns regarding her medicines.  The following changes were made today:  none    Disposition:   FU with Roderic Palau NP in 2weeks to follow-up on GENETIC AF vs tikosyn admit.   Army Fossa, MD  11/27/2014 12:32 PM     [place] [address] [place] [address] [phone] (office) [phone] (fax)

## 2014-11-29 LAB — AMIODARONE LEVEL
Amiodarone Lvl: 0.2 ug/mL — ABNORMAL LOW (ref 1.5–2.5)
DESETHYLAMIODARONE: 0.3 ug/mL — AB (ref 1.5–2.5)

## 2014-12-01 ENCOUNTER — Other Ambulatory Visit: Payer: Self-pay | Admitting: *Deleted

## 2014-12-01 DIAGNOSIS — C50911 Malignant neoplasm of unspecified site of right female breast: Secondary | ICD-10-CM

## 2014-12-01 MED ORDER — LISINOPRIL 10 MG PO TABS: 10.0000 mg | ORAL_TABLET | Freq: Every day | ORAL | Status: DC

## 2014-12-01 MED ORDER — LETROZOLE 2.5 MG PO TABS: 2.5000 mg | ORAL_TABLET | Freq: Every day | ORAL | Status: DC

## 2014-12-17 ENCOUNTER — Ambulatory Visit (INDEPENDENT_AMBULATORY_CARE_PROVIDER_SITE_OTHER): Payer: Medicare Other | Admitting: Nurse Practitioner

## 2014-12-17 ENCOUNTER — Encounter: Payer: Self-pay | Admitting: Nurse Practitioner

## 2014-12-17 VITALS — BP 94/68 | HR 114 | Ht 64.0 in | Wt 189.4 lb

## 2014-12-17 DIAGNOSIS — I4891 Unspecified atrial fibrillation: Secondary | ICD-10-CM | POA: Diagnosis not present

## 2014-12-17 NOTE — Patient Instructions (Signed)
Your physician recommends that you schedule a follow-up appointment with Roderic Palau, NP as needed. Keep your scheduled appointment with Dr. Blima Singer will talk with the research nurse today.  [name] PharmD will be in contact with you to set up appointment to talk about tikosyn.

## 2014-12-17 NOTE — Progress Notes (Signed)
e   Date:  12/17/2014   ID:  Darlene Harrell, DOB 20-Jun-1941, MRN [id number]  PCP:  Annye Asa, MD  Cardiologist:  Dr Johnsie Cancel Primary Electrophysiologist:  Roderic Palau, NP    Chief Complaint  Patient presents with  . Atrial Fibrillation     History of Present Illness: Darlene Harrell is a 74 y.o. female who presents today for electrophysiology evaluation.   She reports having an episode of atrial fibrillation in 2013 in the setting of CHF/ pneumonia.  She did not require cardioversion at that time but was temporarily treated with amiodarone (which she tolerated).  After several months, amiodarone was discontinued.  She is unaware of any further afib until 10/15 when she presented to Dr Antonieta Pert office and was found to have again returned to afib.  She developed progressive shortness of breath and was admitted to [facility] with acute systolic CHF in the setting of atrial fibrillation with RVR. She was diuresed with IV Lasix. She was placed on Eliquis for anticoagulation.  She underwent cardioversion which was unsuccessful 09/09/14.  She was initiated on amiodarone.  Plan was to pursue cardioversion on amiodarone after 3 weeks of uninterrupted anticoagulation.  She went to [facility] in December 12/15and [facility] was delayed. She developed worsening CHF and was therefore hospitalized at [facility] in [place].  She underwent cath which revealed no significant CAD, EF by V gram 25-30% Estimated at 30-35% by echo with moderate LAE.  Aldactone was added and for some reason her amiodarone was stopped.  She reports having a sleep study 12/15 in [place] and she did not have sleep apnea.  Dr. Jackalyn Lombard recommendation's from the visit from 1/28 was possibility of enrollment in  genetic HF as an option and Tikosyn for possible conversion to to SR. Amiodarone level was low when drawn on last  visit. It appears she did not tolerate amiodarone  due to increase in TSH and liver enzymes. She  is interested in Tikosyn but concerned re the cost, not 100% sure she will go thru with it. Will set her up with Gwyneth Sprout., to discuss further. Tolerating Noac without bleeding issues.  Today, she denies symptoms of   chest pain, orthopnea, PND, lower extremity edema, claudication, dizziness, presyncope, syncope, bleeding, or neurologic sequela. Positive for fatigue and shortness ob breath. The patient is tolerating medications without difficulties and is otherwise without complaint today.    Past Medical History  Diagnosis Date  . Breast cancer 2010    a. s/p mastectomy/chemo/XRT to R breast, Dr. Marin Olp.  . Diabetes mellitus   . Hypertension   . Depression   . Syncopal episodes   . Arthritis   . High cholesterol   . Blood in stool     a. due to hemorrhoids.  . Persistent atrial fibrillation     a. In setting of Pneumonia 6/13 - Echocardiogram 03/30/12: Mild LVH, normal LV function, EF 60-65%, no wall motion abnormalities, mild MR, mild LAE, PASP 32.  . LV dysfunction 09/09/2014    EF40%  . Overweight    Past Surgical History  Procedure Laterality Date  . Tonsillectomy    . Breast surgery  2010 and 2011     biopsy and noted mastectomy  . Tee without cardioversion N/A 09/09/2014    Procedure: TRANSESOPHAGEAL ECHOCARDIOGRAM (TEE);  Surgeon: Josue Hector, MD;  Location: [facility] ENDOSCOPY;  Service: Cardiovascular;  Laterality: N/A;  . Cardioversion N/A 09/09/2014    Procedure: CARDIOVERSION;  Surgeon: Josue Hector, MD;  Location: [facility] ENDOSCOPY;  Service: Cardiovascular;  Laterality: N/A;     Current Outpatient Prescriptions  Medication Sig Dispense Refill  . apixaban (ELIQUIS) 5 MG TABS tablet Take 1 tablet (5 mg total) by mouth 2 (two) times daily. 60 tablet 0  . atorvastatin (LIPITOR) 20 MG tablet Take 1 tablet (20 mg total) by mouth daily. 90 tablet 3  . carvedilol (COREG) 25 MG tablet Take 1 tablet (25 mg total) by mouth 2 (two) times daily with a meal. 60 tablet 11  .  Cholecalciferol (VITAMIN D) 2000 UNITS tablet Take 2,000 Units by mouth daily.    . clonazePAM (KLONOPIN) 0.5 MG tablet Take 1 tablet (0.5 mg total) by mouth at bedtime as needed. (Patient taking differently: Take 0.5 mg by mouth at bedtime as needed (sleep). ) 30 tablet 0  . furosemide (LASIX) 40 MG tablet Take 1 tablet (40 mg total) by mouth daily. 30 tablet 6  . glimepiride (AMARYL) 4 MG tablet Take 1 tablet (4 mg total) by mouth daily before breakfast. 90 tablet 1  . ibuprofen (ADVIL,MOTRIN) 200 MG tablet Take 200 mg by mouth every 6 (six) hours as needed (pain).    Marland Kitchen letrozole (FEMARA) 2.5 MG tablet Take 1 tablet (2.5 mg total) by mouth daily. 180 tablet 0  . lisinopril (PRINIVIL,ZESTRIL) 10 MG tablet Take 1 tablet (10 mg total) by mouth daily. 30 tablet 1  . metFORMIN (GLUCOPHAGE) 1000 MG tablet TAKE ONE TABLET BY MOUTH TWICE DAILY WITH  A  MEAL 180 tablet 1  . nicotine polacrilex (NICORETTE) 4 MG gum Take 4 mg by mouth daily as needed for smoking cessation.     Marland Kitchen spironolactone (ALDACTONE) 25 MG tablet Take 25 mg by mouth daily.     No current facility-administered medications for this visit.    Allergies:   Review of patient's allergies indicates no known allergies.   Social History:  The patient  reports that she has been smoking Cigarettes.  She started smoking about 54 years ago. She has a 48 pack-year smoking history. She has never used smokeless tobacco. She reports that she does not drink alcohol or use illicit drugs.   Family History:  The patient's family history includes Alcohol abuse in her father; Diabetes in her father, mother, and son; Early death in her son; Heart disease in her mother; Hyperlipidemia in her son; Hypertension in her father and mother; Kidney failure in her son; Mental retardation in her mother; Stroke in her mother.    ROS:  Please see the history of present illness.   All other systems are reviewed and negative.    PHYSICAL EXAM: VS:  BP 94/68 mmHg   Pulse 114  Ht 5\' 4"  (1.626 m)  Wt 189 lb 6.4 oz (85.911 kg)  BMI 32.49 kg/m2 , BMI Body mass index is 32.49 kg/(m^2). GEN: overweight, well developed, in no acute distress HEENT: normal Neck: no JVD, carotid bruits, or masses Cardiac: iRRR; no murmurs, rubs, or gallops,no edema  Respiratory:  clear to auscultation bilaterally, normal work of breathing GI: soft, nontender, nondistended, + BS MS: no deformity or atrophy Skin: warm and dry  Neuro:  Strength and sensation are intact Psych: euthymic mood, full affect  EKG:  EKG is ordered today. The ekg ordered today shows afib with V rate 114, incomplete LBBB (QRS 126) .QTc is 534 ms, reviewed with Dr. Rayann Heman who thinks it is falsely prolonged due to high v. Rate and LBBB.    Recent Labs: 09/05/2014: Magnesium  1.8 09/06/2014: Hemoglobin 11.1*; Platelets 308 11/25/2014: ALT 24; BUN 17; Creatinine 0.95; Potassium 4.4; Sodium 135; TSH 1.26  1/28: amiodarone level 0.2L    Other studies Reviewed: Additional studies/ records that were reviewed today include: Dr Mariana Arn note, prior echo, and records from OSH  Review of the above records today demonstrates: as per HPI, in additon, EF is reduced with moderate LA enlargement   ASSESSMENT AND PLAN:  1.  Persistent atrial fibrillation The patient has persistent afib with elevated V rates and associated CHF.  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 Therapeutic strategies for afib including rate control and rhyhthm were discussed in detail with the patient today.  Her amiodarone was discontinued it appears for tsh/liver changes.  Given reduced EF and AF, I would also like to expore GENETIC AF study as an option. Research nurse to discuss with pt today. She is willing to participate if she is a candidate.  She is interested in discussing further tikosyn with PharmD.  Given persistent afib, I would recommend medical therapy over ablation at this  time. chads2vasc score is 5.  Continue anticoagulation long term. This patients CHA2DS2-VASc Score and unadjusted Ischemic Stroke Rate (% per year) is equal to 7.2 % stroke rate/year from a score of 5  Above score calculated as 1 point each if present [CHF, HTN, DM, Vascular=MI/PAD/Aortic Plaque, Age if 65-74, or Female] Above score calculated as 2 points each if present [Age > 75, or Stroke/TIA/TE]   2. Nonischemic CM/ CHF Likely due to afib as above I think that she would do better with sinus rhythm GENETIC AF study evaluation today I am limited by hypotension on further rate control today, though V rates are elevated.  3. HTN BP is low today No changes    Disposition:   [facility] Gwyneth Sprout, to discuss Tikosyn. F/U with Dr. Johnsie Cancel as scheduled 3/16 and EP as needed.   Eduard Roux, NP  12/17/2014 11:11 AM     [facility] [address] [place] [address] [phone] (office) [phone] (fax)

## 2014-12-17 NOTE — Progress Notes (Signed)
Pt scheduled to see research for Genetic AF screening visit 12/22/14 @ 11am.

## 2014-12-24 NOTE — Progress Notes (Signed)
Genetic AF Informed Consent  Subject Name: Darlene Harrell  Subject met inclusion and exclusion criteria. The informed consent form, study requirements and expectations were reviewed with the subject and questions and concerns were addressed prior to the signing of the consent form. The subject verbalized understanding of the trail requirements. The subject agreed to participate in the Genetic AF trial and signed the informed consent. The informed consent was obtained prior to performance of any protocol-specific procedures for the subject. A copy of the signed informed consent was given to the subject and a copy was placed in the subject's medical record.   Pamala Duffel  12/24/2014 @ 11:30

## 2014-12-26 ENCOUNTER — Encounter: Payer: Self-pay | Admitting: Family Medicine

## 2014-12-26 ENCOUNTER — Ambulatory Visit (INDEPENDENT_AMBULATORY_CARE_PROVIDER_SITE_OTHER): Payer: Medicare Other | Admitting: Family Medicine

## 2014-12-26 VITALS — BP 112/74 | HR 112 | Temp 97.6°F | Resp 20 | Ht 63.0 in | Wt 188.0 lb

## 2014-12-26 DIAGNOSIS — E559 Vitamin D deficiency, unspecified: Secondary | ICD-10-CM

## 2014-12-26 DIAGNOSIS — I481 Persistent atrial fibrillation: Secondary | ICD-10-CM | POA: Diagnosis not present

## 2014-12-26 DIAGNOSIS — Z1231 Encounter for screening mammogram for malignant neoplasm of breast: Secondary | ICD-10-CM | POA: Diagnosis not present

## 2014-12-26 DIAGNOSIS — I4819 Other persistent atrial fibrillation: Secondary | ICD-10-CM

## 2014-12-26 DIAGNOSIS — Z780 Asymptomatic menopausal state: Secondary | ICD-10-CM | POA: Diagnosis not present

## 2014-12-26 DIAGNOSIS — Z0000 Encounter for general adult medical examination without abnormal findings: Secondary | ICD-10-CM | POA: Diagnosis not present

## 2014-12-26 DIAGNOSIS — E119 Type 2 diabetes mellitus without complications: Secondary | ICD-10-CM

## 2014-12-26 DIAGNOSIS — Z23 Encounter for immunization: Secondary | ICD-10-CM

## 2014-12-26 DIAGNOSIS — E785 Hyperlipidemia, unspecified: Secondary | ICD-10-CM | POA: Diagnosis not present

## 2014-12-26 MED ORDER — ONETOUCH DELICA LANCETS 33G MISC: Status: AC

## 2014-12-26 MED ORDER — ONETOUCH VERIO VI SOLN: 1.0000 "application " | Status: AC

## 2014-12-26 MED ORDER — GLUCOSE BLOOD VI STRP: ORAL_STRIP | Status: AC

## 2014-12-26 NOTE — Progress Notes (Signed)
   Subjective:    Patient ID: Darlene Harrell, female    DOB: Feb 16, 1941, 74 y.o.   MRN: [id number]  HPI Here today for CPE.  Risk Factors: Afib- chronic problem, seeing Cards.  Now on chronic anti-coag w/ Eliquis.  Pt is attempting to enter Afib study.  + SOB w/ exertion. DM- chronic problem, on Amaryl, Metformin.  Due for eye exam- pt plans to schedule.  Denies symptomatic lows. Hyperlipidemia- chronic problem, on Lipitor.  Denies abd pain, N/V, myalgias. Vit D deficiency- noted on previous labs. Physical Activity: not exercising regularly Depression: denies current sxs Hearing: normal to conversational tones and whispered voice at 6 ft ADL's: independent Cognitive: normal linear thought process, memory and attention intact Home Safety: safe at home, lives w/ son Height, Weight, BMI, Visual Acuity: see vitals, vision corrected to 20/20 w/ glasses Counseling: due for colonoscopy (has never had one)- pt is on chronic anticoagulation and is attempting to enroll in study for AFib.  Will hold at this time.  Due for mammo (unilateral L) and DEXA.   Labs Ordered: See A&P Care Plan: See A&P    Review of Systems Patient reports no vision/ hearing changes, adenopathy, fever, weight change,  persistant/recurrent hoarseness, swallowing issues,chest pain, edema, persistant/recurrent cough, hemoptysis, gastrointestinal bleeding (melena, rectal bleeding), abdominal pain, significant heartburn, bowel changes, GU symptoms (dysuria, hematuria, incontinence), Gyn symptoms (abnormal  bleeding, pain),  syncope, focal weakness, memory loss, numbness & tingling, skin/hair/nail changes, abnormal bruising or bleeding, anxiety, or depression.  + intermittent palpitations w/ known afib  + SOB w/ exertion  Reviewed meds, allergies, problem list, and PMH in chart     Objective:   Physical Exam General Appearance:    Alert, cooperative, no distress, appears stated age  Head:    Normocephalic, without obvious  abnormality, atraumatic  Eyes:    PERRL, conjunctiva/corneas clear, EOM's intact, fundi    benign, both eyes  Ears:    Normal TM's and external ear canals, both ears  Nose:   Nares normal, septum midline, mucosa normal, no drainage    or sinus tenderness  Throat:   Lips, mucosa, and tongue normal; teeth and gums normal  Neck:   Supple, symmetrical, trachea midline, no adenopathy;    Thyroid: no enlargement/tenderness/nodules  Back:     Symmetric, no curvature, ROM normal, no CVA tenderness  Lungs:     Clear to auscultation bilaterally, respirations unlabored  Chest Wall:    No tenderness or deformity   Heart:    Irregularly irregular S1/S2  Breast Exam:    Deferred to mammo  Abdomen:     Soft, non-tender, bowel sounds active all four quadrants,    no masses, no organomegaly  Genitalia:    Deferred  Rectal:    Extremities:   Extremities normal, atraumatic, no cyanosis or edema  Pulses:   2+ and symmetric all extremities  Skin:   Skin color, texture, turgor normal, no rashes or lesions  Lymph nodes:   Cervical, supraclavicular, and axillary nodes normal  Neurologic:   CNII-XII intact, normal strength, sensation and reflexes    throughout          Assessment & Plan:

## 2014-12-26 NOTE — Patient Instructions (Signed)
Follow up in 3-4 months to recheck diabetes We'll notify you of your lab results and make any changes if needed We'll call you with your mammo and bone density appts Call and schedule your eye exam at your convenience Try and make healthy food choices and exercise as you are able Call with any questions or concerns Hang in there!!!

## 2014-12-26 NOTE — Progress Notes (Signed)
Pre visit review using our clinic review tool, if applicable. No additional management support is needed unless otherwise documented below in the visit note. 

## 2014-12-28 NOTE — Assessment & Plan Note (Signed)
Ongoing problem for pt.  Is attempting to join a research study for management of this.  If she doesn't meet criteria, she indicated that the next step was Tikosyn.  She is having SOB w/ exertion which is likely a consequence of her Afib.  Currently on anticoagulation.  Will follow along and assist as able.

## 2014-12-28 NOTE — Assessment & Plan Note (Signed)
Chronic problem.  Tolerating statin w/o difficulty.  Check labs.  Adjust meds prn  

## 2014-12-28 NOTE — Assessment & Plan Note (Signed)
Pt's PE WNL w/ exception of known afib, obesity.  Due for mammo and DEXA- orders entered.  Due for colonoscopy but due to anticoag for Afib, she is not a candidate at this time.  Will consider after she knows if she is accepted into the AFib study.  Check labs.  Anticipatory guidance provided on healthy, low carb diet, increasing physical activity.

## 2014-12-28 NOTE — Assessment & Plan Note (Signed)
Chronic problem.  Pt given Verio meter today to replace her broken one.  Due for eye exam- pt plans to schedule.  On ACE for renal protection.  Currently asymptomatic.  Check labs.  Adjust meds prn

## 2014-12-28 NOTE — Assessment & Plan Note (Signed)
Hx of similar.  Check labs.  Replete prn 

## 2014-12-29 ENCOUNTER — Telehealth: Payer: Self-pay | Admitting: Cardiovascular Disease

## 2014-12-29 NOTE — Telephone Encounter (Signed)
New message      Pt c/o medication issue:  1. Name of Medication: spironolactone 2. How are you currently taking this medication (dosage and times per day)? 25mg  daily 3. Are you having a reaction (difficulty breathing--STAT)? no  4. What is your medication issue? Instructions on bottle says 25mg  twice daily and her print out says 25mg  daily.  Pt is also lightheaded.  Which dosage is correct?

## 2014-12-29 NOTE — Addendum Note (Signed)
Addended by: Harl Bowie on: 12/29/2014 08:32 AM   Modules accepted: Orders

## 2014-12-29 NOTE — Telephone Encounter (Signed)
PT'S  SON AWARE  FOR PT  TO   ONLY  TAKE  HCTZ   25 MG 1  TAB  DAILY  ./CY

## 2014-12-30 NOTE — Progress Notes (Signed)
Subject screened and consented to Genetic AF 12/24/2014. Genetic AF lab work received from sponsor and pt does not possess genetic marker to continue in study. Pt advised and verbalized understanding. Pt will f/u with Dr. Johnsie Cancel in March as scheduled and Butch Penny Carrol/Dr. Allred.

## 2015-01-02 ENCOUNTER — Encounter: Payer: Self-pay | Admitting: Hematology & Oncology

## 2015-01-02 ENCOUNTER — Other Ambulatory Visit (HOSPITAL_BASED_OUTPATIENT_CLINIC_OR_DEPARTMENT_OTHER): Payer: Medicare Other | Admitting: Lab

## 2015-01-02 ENCOUNTER — Ambulatory Visit (HOSPITAL_BASED_OUTPATIENT_CLINIC_OR_DEPARTMENT_OTHER): Payer: Medicare Other | Admitting: Hematology & Oncology

## 2015-01-02 VITALS — BP 81/61 | HR 120 | Temp 98.0°F | Resp 14 | Ht 63.0 in | Wt 190.0 lb

## 2015-01-02 DIAGNOSIS — N183 Chronic kidney disease, stage 3 (moderate): Secondary | ICD-10-CM

## 2015-01-02 DIAGNOSIS — I4819 Other persistent atrial fibrillation: Secondary | ICD-10-CM

## 2015-01-02 DIAGNOSIS — Z79810 Long term (current) use of selective estrogen receptor modulators (SERMs): Secondary | ICD-10-CM

## 2015-01-02 DIAGNOSIS — Z780 Asymptomatic menopausal state: Secondary | ICD-10-CM

## 2015-01-02 DIAGNOSIS — I481 Persistent atrial fibrillation: Secondary | ICD-10-CM

## 2015-01-02 DIAGNOSIS — E785 Hyperlipidemia, unspecified: Secondary | ICD-10-CM

## 2015-01-02 DIAGNOSIS — E559 Vitamin D deficiency, unspecified: Secondary | ICD-10-CM

## 2015-01-02 DIAGNOSIS — Z1231 Encounter for screening mammogram for malignant neoplasm of breast: Secondary | ICD-10-CM | POA: Diagnosis not present

## 2015-01-02 DIAGNOSIS — Z0000 Encounter for general adult medical examination without abnormal findings: Secondary | ICD-10-CM

## 2015-01-02 DIAGNOSIS — D631 Anemia in chronic kidney disease: Secondary | ICD-10-CM

## 2015-01-02 DIAGNOSIS — Z853 Personal history of malignant neoplasm of breast: Secondary | ICD-10-CM | POA: Diagnosis not present

## 2015-01-02 DIAGNOSIS — E0810 Diabetes mellitus due to underlying condition with ketoacidosis without coma: Secondary | ICD-10-CM

## 2015-01-02 DIAGNOSIS — C50911 Malignant neoplasm of unspecified site of right female breast: Secondary | ICD-10-CM

## 2015-01-02 DIAGNOSIS — E119 Type 2 diabetes mellitus without complications: Secondary | ICD-10-CM

## 2015-01-02 LAB — CBC WITH DIFFERENTIAL (CANCER CENTER ONLY)
BASO#: 0 10*3/uL (ref 0.0–0.2)
BASO%: 0.3 % (ref 0.0–2.0)
EOS%: 1.9 % (ref 0.0–7.0)
Eosinophils Absolute: 0.1 10*3/uL (ref 0.0–0.5)
HEMATOCRIT: 35 % (ref 34.8–46.6)
HEMOGLOBIN: 11 g/dL — AB (ref 11.6–15.9)
LYMPH#: 1.1 10*3/uL (ref 0.9–3.3)
LYMPH%: 15.9 % (ref 14.0–48.0)
MCH: 27.3 pg (ref 26.0–34.0)
MCHC: 31.4 g/dL — ABNORMAL LOW (ref 32.0–36.0)
MCV: 87 fL (ref 81–101)
MONO#: 1 10*3/uL — AB (ref 0.1–0.9)
MONO%: 13.7 % — ABNORMAL HIGH (ref 0.0–13.0)
NEUT%: 68.2 % (ref 39.6–80.0)
NEUTROS ABS: 4.7 10*3/uL (ref 1.5–6.5)
Platelets: 255 10*3/uL (ref 145–400)
RBC: 4.03 10*6/uL (ref 3.70–5.32)
RDW: 17.7 % — ABNORMAL HIGH (ref 11.1–15.7)
WBC: 6.9 10*3/uL (ref 3.9–10.0)

## 2015-01-02 LAB — CMP (CANCER CENTER ONLY)
ALBUMIN: 3.8 g/dL (ref 3.3–5.5)
ALT(SGPT): 14 U/L (ref 10–47)
AST: 15 U/L (ref 11–38)
Alkaline Phosphatase: 51 U/L (ref 26–84)
BUN, Bld: 23 mg/dL — ABNORMAL HIGH (ref 7–22)
CHLORIDE: 100 meq/L (ref 98–108)
CO2: 27 meq/L (ref 18–33)
Calcium: 9.4 mg/dL (ref 8.0–10.3)
Creat: 1.2 mg/dl (ref 0.6–1.2)
GLUCOSE: 129 mg/dL — AB (ref 73–118)
POTASSIUM: 5.4 meq/L — AB (ref 3.3–4.7)
Sodium: 141 mEq/L (ref 128–145)
TOTAL PROTEIN: 7.3 g/dL (ref 6.4–8.1)
Total Bilirubin: 1.1 mg/dl (ref 0.20–1.60)

## 2015-01-05 NOTE — Progress Notes (Signed)
Hematology and Oncology Follow Up Visit  Darlene Harrell [id number] 11-Oct-1941 74 y.o. 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  Harrell is in for followup. Last saw her back in September. Since then, she doing pretty well. She does complain of fatigue. There is some shortness of breath. I think a lot of this is related to her diabetes and atrial fibrillation. She got through the holidays okay. She got through the wintertime.  Is on ELIQUIS for the atrial fibrillation. Patient does have diabetes. I do not know how much or how well controlled this is.  She's not had any bleeding. She's had no change in bowel or bladder habits.  Her last mammogram was back in February 2015. I think she is due for another one.  She's had no rashes. There's been no leg swelling. She's had no cough. She's had no infections. She's had no fever.   Overall, her performance status is ECOG 1  Medications:  Current outpatient prescriptions:  .  apixaban (ELIQUIS) 5 MG TABS tablet, Take 1 tablet (5 mg total) by mouth 2 (two) times daily., Disp: 60 tablet, Rfl: 0 .  atorvastatin (LIPITOR) 20 MG tablet, Take 1 tablet (20 mg total) by mouth daily., Disp: 90 tablet, Rfl: 3 .  Blood Glucose Calibration (ONETOUCH VERIO) SOLN, 1 application by In Vitro route as directed., Disp: 1 each, Rfl: 6 .  carvedilol (COREG) 25 MG tablet, Take 1 tablet (25 mg total) by mouth 2 (two) times daily with a meal., Disp: 60 tablet, Rfl: 11 .  Cholecalciferol (VITAMIN D) 2000 UNITS tablet, Take 2,000 Units by mouth daily., Disp: , Rfl:  .  clonazePAM (KLONOPIN) 0.5 MG tablet, Take 1 tablet (0.5 mg total) by mouth at bedtime as needed. (Patient taking differently: Take 0.5 mg by mouth at bedtime as needed (sleep). ), Disp: 30 tablet, Rfl: 0 .  furosemide (LASIX) 40 MG tablet, Take 1  tablet (40 mg total) by mouth daily., Disp: 30 tablet, Rfl: 6 .  glimepiride (AMARYL) 4 MG tablet, Take 1 tablet (4 mg total) by mouth daily before breakfast., Disp: 90 tablet, Rfl: 1 .  glucose blood (ONETOUCH VERIO) test strip, Test 1-2x daily due to labile blood sugars Dx:E11.9, Disp: 100 each, Rfl: 12 .  ibuprofen (ADVIL,MOTRIN) 200 MG tablet, Take 200 mg by mouth every 6 (six) hours as needed (pain)., Disp: , Rfl:  .  letrozole (FEMARA) 2.5 MG tablet, Take 1 tablet (2.5 mg total) by mouth daily., Disp: 180 tablet, Rfl: 0 .  lisinopril (PRINIVIL,ZESTRIL) 10 MG tablet, Take 1 tablet (10 mg total) by mouth daily., Disp: 30 tablet, Rfl: 1 .  metFORMIN (GLUCOPHAGE) 1000 MG tablet, TAKE ONE TABLET BY MOUTH TWICE DAILY WITH  A  MEAL, Disp: 180 tablet, Rfl: 1 .  nicotine polacrilex (NICORETTE) 4 MG gum, Take 4 mg by mouth daily as needed for smoking cessation. , Disp: , Rfl:  .  ONETOUCH DELICA LANCETS 92E MISC, Use as directed to test sugars, Disp: 100 each, Rfl: 3 .  spironolactone (ALDACTONE) 25 MG tablet, Take 25 mg by mouth daily., Disp: , Rfl:   Allergies: No Known Allergies  Past Medical History, Surgical history, Social history, and Family History were reviewed and updated.  Review of Systems: As above  Physical Exam:  height is 5' 3"  (1.6  m) and weight is 190 lb (86.183 kg). Her oral temperature is 98 F (36.7 C). Her blood pressure is 81/61 and her pulse is 120. Her respiration is 14.   Well-developed well-nourished white female. Head and neck exam shows no ocular or oral lesions. She has no scleral icterus. She is no oral lesion. There is no adenopathy in the neck. Lungs are clear bilaterally. Cardiac exam is tachycardic and irregular. She has no murmurs rubs or bruits. Abdomen is soft. She has good bowel sounds. There is no fluid wave. There is no palpable liver or spleen tip. Exam shows no tenderness over the spine, ribs or hips. Her breast exam shows left breast with no masses, or  erythema. There is no left axillary adenopathy. Right chest wall shows well-healed mastectomy. No right chest wall nodules are noted. There is no right axillary adenopathy. Back exam no tenderness over the spine, ribs or hips. Extremities shows no clubbing, cyanosis or edema. Neurological exam shows no focal neurological deficits. Skin exam no rashes, ecchymosis or petechia. Lab Results  Component Value Date   WBC 6.9 01/02/2015   HGB 11.0* 01/02/2015   HCT 35.0 01/02/2015   MCV 87 01/02/2015   PLT 255 01/02/2015     Chemistry      Component Value Date/Time   NA 141 01/02/2015 1027   NA 135 11/25/2014 1201   K 5.4* 01/02/2015 1027   K 4.4 11/25/2014 1201   CL 100 01/02/2015 1027   CL 103 11/25/2014 1201   CO2 27 01/02/2015 1027   CO2 22 11/25/2014 1201   BUN 23* 01/02/2015 1027   BUN 17 11/25/2014 1201   CREATININE 1.2 01/02/2015 1027   CREATININE 0.95 11/25/2014 1201      Component Value Date/Time   CALCIUM 9.4 01/02/2015 1027   CALCIUM 9.4 11/25/2014 1201   ALKPHOS 51 01/02/2015 1027   ALKPHOS 65 11/25/2014 1201   AST 15 01/02/2015 1027   AST 13 11/25/2014 1201   ALT 14 01/02/2015 1027   ALT 24 11/25/2014 1201   BILITOT 1.10 01/02/2015 1027   BILITOT 0.6 11/25/2014 1201         Impression and Plan: Darlene Harrell is a 74 year old female. She has a history of stage IIIc ductal carcinoma the right breast. She had 11 positive lymph nodes. She is ER positive. She is on Femara. I would keep her on Femara for life.  She looks pretty good. She really does not have any problems with her heart.  She still has not decided about going up and living in [place]. She will visit her family up there this summer.  She really needs to get better control of her blood sugars.   We will plan to get back to see [place] in another 6 months.  Volanda Napoleon, MD 3/7/20167:34 AM

## 2015-01-06 ENCOUNTER — Ambulatory Visit: Payer: Medicare Other

## 2015-01-06 ENCOUNTER — Other Ambulatory Visit: Payer: Medicare Other

## 2015-01-07 ENCOUNTER — Ambulatory Visit: Payer: Medicare Other | Admitting: Family

## 2015-01-07 ENCOUNTER — Telehealth: Payer: Self-pay | Admitting: General Practice

## 2015-01-07 ENCOUNTER — Other Ambulatory Visit: Payer: Medicare Other | Admitting: Lab

## 2015-01-07 NOTE — Progress Notes (Signed)
Pt stated that she had the labs drawn at oncology on Friday. Stated that they drew an extra tube for whatever you needed?

## 2015-01-07 NOTE — Telephone Encounter (Signed)
-----   Message from Midge Minium, MD sent at 01/07/2015  8:17 AM EST ----- Please figure out if pt had labs done.  Pt was not able to get labs drawn here and was to have them done at [facility] ----- Message -----    From: SYSTEM    Sent: 01/07/2015  12:04 AM      To: Midge Minium, MD

## 2015-01-07 NOTE — Telephone Encounter (Signed)
Pt is in need of TSH, A1C, lipid, Vit D- all are listed under labs as 'need to be collected'.  Not sure how drawing an extra tube helps as they do not send [place] the blood.  If Oncology did not draw these labs as ordered, will need to return to our office to have it done.  Will also send note to oncology to see if we can determine what happened w/ the extra tube of blood and if labs can be run.

## 2015-01-07 NOTE — Telephone Encounter (Signed)
Spoke with pt today. She advised that she did have labs drawn at oncology and advised them that you wanted additional labs drawn. Pt stated that they told her they drew "an extra tube of blood" for whatever you needed.

## 2015-01-09 ENCOUNTER — Other Ambulatory Visit: Payer: Medicare Other

## 2015-01-09 ENCOUNTER — Ambulatory Visit
Admission: RE | Admit: 2015-01-09 | Discharge: 2015-01-09 | Disposition: A | Discharge status: Home / Self Care | Payer: Medicare Other | Source: Ambulatory Visit | Attending: Family Medicine | Admitting: Family Medicine

## 2015-01-09 DIAGNOSIS — Z1231 Encounter for screening mammogram for malignant neoplasm of breast: Secondary | ICD-10-CM

## 2015-01-09 NOTE — Telephone Encounter (Signed)
Spoke with pt, apologized for any confusion. Pt agreed to come into office on 3/15 @9 :45am to have her Lipids, Vitamin D, TSH, and A1c drawn.

## 2015-01-12 ENCOUNTER — Other Ambulatory Visit: Payer: Self-pay | Admitting: Cardiovascular Disease

## 2015-01-13 ENCOUNTER — Other Ambulatory Visit: Payer: Medicare Other

## 2015-01-14 ENCOUNTER — Ambulatory Visit (INDEPENDENT_AMBULATORY_CARE_PROVIDER_SITE_OTHER): Payer: Medicare Other | Admitting: Cardiovascular Disease

## 2015-01-14 ENCOUNTER — Encounter: Payer: Self-pay | Admitting: Cardiovascular Disease

## 2015-01-14 VITALS — BP 90/64 | HR 90 | Ht 63.0 in | Wt 192.8 lb

## 2015-01-14 DIAGNOSIS — I481 Persistent atrial fibrillation: Secondary | ICD-10-CM | POA: Diagnosis not present

## 2015-01-14 DIAGNOSIS — I4819 Other persistent atrial fibrillation: Secondary | ICD-10-CM

## 2015-01-14 NOTE — Progress Notes (Signed)
Patient ID: Darlene Harrell, female   DOB: 1941-09-11, 74 y.o.   MRN: [id number] Darlene Harrell is a 74 y.o. Marland Kitchen female who presents today for f/u  She reports having an episode of atrial fibrillation in 2013 in the setting of CHF/ pneumonia. She did not require cardioversion at that time but was temporarily treated with amiodarone (which she tolerated). After several months, amiodarone was discontinued. She is unaware of any further afib until 10/15 when she presented to Dr Darlene Harrell office and was found to have again returned to afib. She developed progressive shortness of breath and was admitted to [facility] with acute systolic CHF in the setting of atrial fibrillation with RVR. She was diuresed with IV Lasix. She was placed on Eliquis for anticoagulation. She underwent cardioversion which was unsuccessful 09/09/14. She was initiated on amiodarone. Plan was to pursue cardioversion on amiodarone after 3 weeks of uninterrupted anticoagulation.  She went to [facility] in December 12/15and [facility] was delayed. She developed worsening CHF and was therefore hospitalized at [facility] in [place]. She underwent cath which revealed no significant CAD, EF by V gram 25-30% Estimated at 30-35% by echo with moderate LAE. Aldactone was added and for some reason her amiodarone was stopped.  She reports having a sleep study 12/15 in [place] and she did not have sleep apnea.  Dr. Jackalyn Harrell recommendation's from the visit from 1/28 was possibility of enrollment in genetic HF as an option and Tikosyn for possible conversion to to SR. Amiodarone level was low when drawn on last visit. It appears she did not tolerate amiodarone due to increase in TSH and liver enzymes. She is interested in Tikosyn but concerned re the cost, not 100% sure she will go thru with it.  Tolerating Noac without bleeding issues  She continues to have class 3 symptoms with relative hypotension Apparently has been turned down for  Genetic AF.  Discussed admission for tikosyn load and [facility] Will arrange admission for this Friday with tikosyn load and [facility] on Monday.  She understands the need for telemetry and potential for proarrhythmia   ROS: Denies fever, malais, weight loss, blurry vision, decreased visual acuity, cough, sputum, SOB, hemoptysis, pleuritic pain, palpitaitons, heartburn, abdominal pain, melena, lower extremity edema, claudication, or rash.  All other systems reviewed and negative  General: Affect appropriate Healthy:  appears stated age 74: normal Neck supple with no adenopathy JVP normal no bruits no thyromegaly Lungs clear with no wheezing and good diaphragmatic motion Heart:  S1/S2 no murmur, no rub, gallop or click PMI normal Abdomen: benighn, BS positve, no tenderness, no AAA no bruit.  No HSM or HJR Distal pulses intact with no bruits No edema Neuro non-focal Skin warm and dry No muscular weakness   Current Outpatient Prescriptions  Medication Sig Dispense Refill  . apixaban (ELIQUIS) 5 MG TABS tablet Take 1 tablet (5 mg total) by mouth 2 (two) times daily. 60 tablet 0  . atorvastatin (LIPITOR) 20 MG tablet Take 1 tablet (20 mg total) by mouth daily. 90 tablet 3  . Blood Glucose Calibration (ONETOUCH VERIO) SOLN 1 application by In Vitro route as directed. 1 each 6  . carvedilol (COREG) 25 MG tablet Take 1 tablet (25 mg total) by mouth 2 (two) times daily with a meal. 60 tablet 11  . Cholecalciferol (VITAMIN D) 2000 UNITS tablet Take 2,000 Units by mouth daily.    . clonazePAM (KLONOPIN) 0.5 MG tablet Take 1 tablet (0.5 mg total) by mouth at bedtime as needed. (  Patient taking differently: Take 0.5 mg by mouth at bedtime as needed (sleep). ) 30 tablet 0  . furosemide (LASIX) 40 MG tablet Take 1 tablet (40 mg total) by mouth daily. 30 tablet 6  . glimepiride (AMARYL) 4 MG tablet Take 1 tablet (4 mg total) by mouth daily before breakfast. 90 tablet 1  . glucose blood (ONETOUCH VERIO) test  strip Test 1-2x daily due to labile blood sugars Dx:E11.9 100 each 12  . ibuprofen (ADVIL,MOTRIN) 200 MG tablet Take 200 mg by mouth every 6 (six) hours as needed (pain).    Marland Kitchen letrozole (FEMARA) 2.5 MG tablet Take 1 tablet (2.5 mg total) by mouth daily. 180 tablet 0  . lisinopril (PRINIVIL,ZESTRIL) 10 MG tablet TAKE 1 TABLET BY MOUTH DAILY 90 tablet 0  . metFORMIN (GLUCOPHAGE) 1000 MG tablet TAKE ONE TABLET BY MOUTH TWICE DAILY WITH  A  MEAL 180 tablet 1  . nicotine polacrilex (NICORETTE) 4 MG gum Take 4 mg by mouth daily as needed for smoking cessation.     [name] DELICA LANCETS 95G MISC Use as directed to test sugars 100 each 3  . spironolactone (ALDACTONE) 25 MG tablet Take 25 mg by mouth daily.     No current facility-administered medications for this visit.    Allergies  Review of patient's allergies indicates no known allergies.  Electrocardiogram:  2/17  afib rate 118  IVCD   Assessmtent and Plan  1. Afib  - failed [facility] Elevated TSH/LFt's on amiodarone  Not eligible for genetic AF  Discussed with Darlene Harrell.  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 DCM  Rx limited by relatively low BP  Need to restore NSR See above continue current meds No CAD by cath 2015   3. Chol:  On statin check labs during hospitalization for afib

## 2015-01-14 NOTE — Patient Instructions (Signed)
Your physician recommends that you continue on your current medications as directed. Please refer to the Current Medication list given to you today.  PT  TO BE ADMITTED  FRI TO  [facility] FOR TIKOSYN THERAPY

## 2015-01-16 ENCOUNTER — Other Ambulatory Visit: Payer: Medicare Other

## 2015-01-16 ENCOUNTER — Ambulatory Visit (INDEPENDENT_AMBULATORY_CARE_PROVIDER_SITE_OTHER): Payer: Medicare Other | Admitting: Pharmacist

## 2015-01-16 DIAGNOSIS — I481 Persistent atrial fibrillation: Secondary | ICD-10-CM

## 2015-01-16 DIAGNOSIS — I4819 Other persistent atrial fibrillation: Secondary | ICD-10-CM

## 2015-01-16 LAB — BASIC METABOLIC PANEL
BUN: 26 mg/dL — AB (ref 6–23)
CALCIUM: 9.4 mg/dL (ref 8.4–10.5)
CO2: 27 mEq/L (ref 19–32)
Chloride: 102 mEq/L (ref 96–112)
Creatinine, Ser: 1.3 mg/dL — ABNORMAL HIGH (ref 0.40–1.20)
GFR: 42.86 mL/min — AB (ref >60.00)
GLUCOSE: 180 mg/dL — AB (ref 70–99)
Potassium: 4.1 mEq/L (ref 3.5–5.1)
Sodium: 137 mEq/L (ref 135–145)

## 2015-01-16 LAB — MAGNESIUM: MAGNESIUM: 1.4 mg/dL — AB (ref 1.5–2.5)

## 2015-01-16 MED ORDER — MAGNESIUM 400 MG PO TABS: 400.0000 mg | ORAL_TABLET | Freq: Two times a day (BID) | ORAL | Status: DC

## 2015-01-16 NOTE — Progress Notes (Signed)
S/O: Darlene Harrell is a 74 yo female referred to pharmacy clinic by Dr. Johnsie Cancel for Tikosyn initiation. PMH is significant for atrial fibrillation since 2013, heart failure, DM, HLD, and HTN. She was previously on amiodarone after her first episode of afib in 2013 in the setting of CHF/pneumonia. She was unaware of any further afib until 10/15 when she presented to Dr Antonieta Pert office and was found to have again returned to afib. She developed progressive shortness of breath and was admitted to [facility] with acute systolic CHF in the setting of atrial fibrillation with RVR. Cardioversion on 09/09/14 was unsuccessful. Amiodarone was discontinued due to increase in TSH and liver enzymes. Patient is interested in [place] but is concerned about the cost.  Patient presented to clinic today with her son, Darlene Harrell. Discussed expected outcomes and side effects of Tikosyn as well as expected hospital course. Reviewed patient's chart; she is not currently taking any medications that are contraindicated with Tikosyn. She is adequately anticoagulated on Eliquis 5mg  BID with reported compliance over the last 30 days.  Patient has [employer] for Part D coverage. Insurance company stated that she has $16 left to pay towards her $360 deductible. After that, she will have to pay 50% of the cost of Tikosyn per month. Patient receives medications at [facility], estimated copay will be $240 per month.  EKG reviewed by Dr. Rayann Heman. Atrial fibrillation with vent rate of 125 bpm. QTc 549msec. Per Dr. Rayann Heman, in setting of IVCD and RVR, difficult to interpret QTc, likely 435msec and okay to start Tikosyn.  Current Outpatient Prescriptions on File Prior to Visit  Medication Sig Dispense Refill  . apixaban (ELIQUIS) 5 MG TABS tablet Take 1 tablet (5 mg total) by mouth 2 (two) times daily. 60 tablet 0  . atorvastatin (LIPITOR) 20 MG tablet Take 1 tablet (20 mg total) by mouth daily. 90 tablet 3  . Blood Glucose Calibration (ONETOUCH  VERIO) SOLN 1 application by In Vitro route as directed. 1 each 6  . carvedilol (COREG) 25 MG tablet Take 1 tablet (25 mg total) by mouth 2 (two) times daily with a meal. 60 tablet 11  . Cholecalciferol (VITAMIN D) 2000 UNITS tablet Take 2,000 Units by mouth daily.    . clonazePAM (KLONOPIN) 0.5 MG tablet Take 1 tablet (0.5 mg total) by mouth at bedtime as needed. (Patient taking differently: Take 0.5 mg by mouth at bedtime as needed (sleep). ) 30 tablet 0  . furosemide (LASIX) 40 MG tablet Take 1 tablet (40 mg total) by mouth daily. 30 tablet 6  . glimepiride (AMARYL) 4 MG tablet Take 1 tablet (4 mg total) by mouth daily before breakfast. 90 tablet 1  . glucose blood (ONETOUCH VERIO) test strip Test 1-2x daily due to labile blood sugars Dx:E11.9 100 each 12  . ibuprofen (ADVIL,MOTRIN) 200 MG tablet Take 200 mg by mouth every 6 (six) hours as needed (pain).    Marland Kitchen letrozole (FEMARA) 2.5 MG tablet Take 1 tablet (2.5 mg total) by mouth daily. 180 tablet 0  . lisinopril (PRINIVIL,ZESTRIL) 10 MG tablet TAKE 1 TABLET BY MOUTH DAILY 90 tablet 0  . metFORMIN (GLUCOPHAGE) 1000 MG tablet TAKE ONE TABLET BY MOUTH TWICE DAILY WITH  A  MEAL 180 tablet 1  . nicotine polacrilex (NICORETTE) 4 MG gum Take 4 mg by mouth daily as needed for smoking cessation.     [name] DELICA LANCETS 94W MISC Use as directed to test sugars 100 each 3  . spironolactone (ALDACTONE)  25 MG tablet Take 25 mg by mouth daily.     No current facility-administered medications on file prior to visit.   No Known Allergies   A/P: EKG reviewed by Dr. Rayann Heman and patient okay for admission for Tikosyn initiation. K acceptable at 4.1, Mg low at 1.4. Discussed cost of medication with patient (copay will be ~$240/month) and she wanted to think about the decision to start Tikosyn. Received a phone call in the afternoon from her daughter stating that they had found a medication assistance program that they were pursuing that would provide Tikosyn  at ~$85 for the first month and ~$30 every month after. She would still like to start Tikosyn on Monday and will supplement her magnesium over the weekend. Prescription has been sent for magnesium 400mg  BID. Patient will return Monday AM (01/19/15) for lab work and hopeful Tikosyn initiation that afternoon.  Megan E. Supple, Pharm.D Clinical Pharmacy Resident Pager: [phone] 01/16/2015 2:08 PM

## 2015-01-19 ENCOUNTER — Other Ambulatory Visit: Payer: Self-pay | Admitting: Pharmacist

## 2015-01-19 ENCOUNTER — Encounter (HOSPITAL_COMMUNITY): Payer: Self-pay | Admitting: Physician Assistant

## 2015-01-19 ENCOUNTER — Other Ambulatory Visit (INDEPENDENT_AMBULATORY_CARE_PROVIDER_SITE_OTHER): Payer: Medicare Other | Admitting: *Deleted

## 2015-01-19 ENCOUNTER — Inpatient Hospital Stay (HOSPITAL_COMMUNITY)
Admission: AD | Admit: 2015-01-19 | Discharge: 2015-01-22 | DRG: 309 | Disposition: A | Discharge status: Home / Self Care | Payer: Medicare Other | Source: Ambulatory Visit | Attending: Internal Medicine | Admitting: Internal Medicine

## 2015-01-19 DIAGNOSIS — I10 Essential (primary) hypertension: Secondary | ICD-10-CM | POA: Diagnosis present

## 2015-01-19 DIAGNOSIS — F17210 Nicotine dependence, cigarettes, uncomplicated: Secondary | ICD-10-CM | POA: Diagnosis present

## 2015-01-19 DIAGNOSIS — E119 Type 2 diabetes mellitus without complications: Secondary | ICD-10-CM | POA: Diagnosis present

## 2015-01-19 DIAGNOSIS — Z6833 Body mass index (BMI) 33.0-33.9, adult: Secondary | ICD-10-CM | POA: Diagnosis not present

## 2015-01-19 DIAGNOSIS — I481 Persistent atrial fibrillation: Secondary | ICD-10-CM | POA: Diagnosis not present

## 2015-01-19 DIAGNOSIS — E785 Hyperlipidemia, unspecified: Secondary | ICD-10-CM | POA: Diagnosis present

## 2015-01-19 DIAGNOSIS — Z8701 Personal history of pneumonia (recurrent): Secondary | ICD-10-CM | POA: Diagnosis not present

## 2015-01-19 DIAGNOSIS — I429 Cardiomyopathy, unspecified: Secondary | ICD-10-CM

## 2015-01-19 DIAGNOSIS — Z9010 Acquired absence of unspecified breast and nipple: Secondary | ICD-10-CM | POA: Diagnosis present

## 2015-01-19 DIAGNOSIS — E780 Pure hypercholesterolemia: Secondary | ICD-10-CM | POA: Diagnosis present

## 2015-01-19 DIAGNOSIS — I5022 Chronic systolic (congestive) heart failure: Secondary | ICD-10-CM | POA: Diagnosis present

## 2015-01-19 DIAGNOSIS — E669 Obesity, unspecified: Secondary | ICD-10-CM | POA: Diagnosis present

## 2015-01-19 DIAGNOSIS — I519 Heart disease, unspecified: Secondary | ICD-10-CM

## 2015-01-19 DIAGNOSIS — I4891 Unspecified atrial fibrillation: Secondary | ICD-10-CM | POA: Diagnosis present

## 2015-01-19 DIAGNOSIS — F329 Major depressive disorder, single episode, unspecified: Secondary | ICD-10-CM | POA: Diagnosis present

## 2015-01-19 DIAGNOSIS — Z853 Personal history of malignant neoplasm of breast: Secondary | ICD-10-CM

## 2015-01-19 DIAGNOSIS — M1990 Unspecified osteoarthritis, unspecified site: Secondary | ICD-10-CM | POA: Diagnosis present

## 2015-01-19 HISTORY — DX: Chronic systolic (congestive) heart failure: I50.22

## 2015-01-19 HISTORY — DX: Malignant neoplasm of unspecified site of right female breast: C50.911

## 2015-01-19 HISTORY — DX: Type 2 diabetes mellitus without complications: E11.9

## 2015-01-19 HISTORY — DX: Pneumonia, unspecified organism: J18.9

## 2015-01-19 HISTORY — DX: Anxiety disorder, unspecified: F41.9

## 2015-01-19 LAB — BASIC METABOLIC PANEL
BUN: 22 mg/dL (ref 6–23)
CO2: 29 mEq/L (ref 19–32)
CREATININE: 1.19 mg/dL — AB (ref 0.50–1.10)
Calcium: 9.7 mg/dL (ref 8.4–10.5)
Chloride: 106 mEq/L (ref 96–112)
Glucose, Bld: 136 mg/dL — ABNORMAL HIGH (ref 70–99)
Potassium: 4.1 mEq/L (ref 3.5–5.3)
Sodium: 141 mEq/L (ref 135–145)

## 2015-01-19 LAB — MAGNESIUM: MAGNESIUM: 1.9 mg/dL (ref 1.5–2.5)

## 2015-01-19 MED ORDER — SODIUM CHLORIDE 0.9 % IJ SOLN: 3.0000 mL | INTRAMUSCULAR | Status: DC | PRN

## 2015-01-19 MED ORDER — CARVEDILOL 25 MG PO TABS: 25.0000 mg | ORAL_TABLET | Freq: Two times a day (BID) | ORAL | Status: DC

## 2015-01-19 MED ORDER — DILTIAZEM HCL 30 MG PO TABS
30.0000 mg | ORAL_TABLET | Freq: Four times a day (QID) | ORAL | Status: DC
  Administered 2015-01-19 – 2015-01-20 (×2): 30 mg via ORAL
  Filled 2015-01-19 (×8): qty 1

## 2015-01-19 MED ORDER — SPIRONOLACTONE 25 MG PO TABS: 25.0000 mg | ORAL_TABLET | Freq: Every day | ORAL | Status: DC

## 2015-01-19 MED ORDER — LETROZOLE 2.5 MG PO TABS
2.5000 mg | ORAL_TABLET | Freq: Every day | ORAL | Status: DC
  Administered 2015-01-20 – 2015-01-22 (×3): 2.5 mg via ORAL
  Filled 2015-01-19 (×3): qty 1

## 2015-01-19 MED ORDER — METFORMIN HCL 500 MG PO TABS
1000.0000 mg | ORAL_TABLET | ORAL | Status: AC
  Administered 2015-01-19: 1000 mg via ORAL
  Filled 2015-01-19: qty 2

## 2015-01-19 MED ORDER — APIXABAN 5 MG PO TABS
5.0000 mg | ORAL_TABLET | Freq: Two times a day (BID) | ORAL | Status: DC
  Administered 2015-01-19 – 2015-01-22 (×6): 5 mg via ORAL
  Filled 2015-01-19 (×7): qty 1

## 2015-01-19 MED ORDER — SODIUM CHLORIDE 0.9 % IJ SOLN
3.0000 mL | Freq: Two times a day (BID) | INTRAMUSCULAR | Status: DC
  Administered 2015-01-19 – 2015-01-22 (×4): 3 mL via INTRAVENOUS

## 2015-01-19 MED ORDER — FUROSEMIDE 40 MG PO TABS: 40.0000 mg | ORAL_TABLET | Freq: Every day | ORAL | Status: DC

## 2015-01-19 MED ORDER — LISINOPRIL 10 MG PO TABS: 10.0000 mg | ORAL_TABLET | Freq: Every day | ORAL | Status: DC

## 2015-01-19 MED ORDER — ATORVASTATIN CALCIUM 20 MG PO TABS
20.0000 mg | ORAL_TABLET | Freq: Every day | ORAL | Status: DC
  Administered 2015-01-20 – 2015-01-21 (×2): 20 mg via ORAL
  Filled 2015-01-19 (×3): qty 1

## 2015-01-19 MED ORDER — SODIUM CHLORIDE 0.9 % IV SOLN: 250.0000 mL | INTRAVENOUS | Status: DC | PRN

## 2015-01-19 MED ORDER — CLONAZEPAM 0.5 MG PO TABS
0.5000 mg | ORAL_TABLET | Freq: Every day | ORAL | Status: DC
  Administered 2015-01-19 – 2015-01-21 (×3): 0.5 mg via ORAL
  Filled 2015-01-19 (×4): qty 1

## 2015-01-19 MED ORDER — DOFETILIDE 250 MCG PO CAPS
375.0000 ug | ORAL_CAPSULE | Freq: Two times a day (BID) | ORAL | Status: DC
  Administered 2015-01-19 – 2015-01-20 (×3): 375 ug via ORAL
  Filled 2015-01-19 (×6): qty 1

## 2015-01-19 MED ORDER — MAGNESIUM OXIDE 400 (241.3 MG) MG PO TABS
400.0000 mg | ORAL_TABLET | Freq: Two times a day (BID) | ORAL | Status: DC
Start: 2015-01-19 — End: 2015-01-22
  Administered 2015-01-19 – 2015-01-22 (×6): 400 mg via ORAL
  Filled 2015-01-19 (×7): qty 1

## 2015-01-19 MED ORDER — NICOTINE POLACRILEX 2 MG MT GUM
4.0000 mg | CHEWING_GUM | Freq: Every day | OROMUCOSAL | Status: DC | PRN
  Filled 2015-01-19: qty 2

## 2015-01-19 MED ORDER — MAGNESIUM 400 MG PO TABS: 400.0000 mg | ORAL_TABLET | Freq: Two times a day (BID) | ORAL | Status: DC

## 2015-01-19 MED ORDER — GLIMEPIRIDE 4 MG PO TABS
4.0000 mg | ORAL_TABLET | Freq: Every day | ORAL | Status: DC
  Administered 2015-01-20 – 2015-01-22 (×3): 4 mg via ORAL
  Filled 2015-01-19 (×4): qty 1

## 2015-01-19 MED ORDER — CARVEDILOL 25 MG PO TABS
25.0000 mg | ORAL_TABLET | Freq: Two times a day (BID) | ORAL | Status: DC
  Administered 2015-01-19 – 2015-01-22 (×5): 25 mg via ORAL
  Filled 2015-01-19 (×8): qty 1

## 2015-01-19 MED ORDER — LISINOPRIL 10 MG PO TABS
10.0000 mg | ORAL_TABLET | Freq: Every day | ORAL | Status: DC
  Administered 2015-01-20 – 2015-01-22 (×3): 10 mg via ORAL
  Filled 2015-01-19 (×3): qty 1

## 2015-01-19 MED ORDER — METFORMIN HCL 500 MG PO TABS
1000.0000 mg | ORAL_TABLET | Freq: Two times a day (BID) | ORAL | Status: DC
  Administered 2015-01-20 – 2015-01-22 (×5): 1000 mg via ORAL
  Filled 2015-01-19 (×7): qty 2

## 2015-01-19 MED ORDER — FUROSEMIDE 40 MG PO TABS
40.0000 mg | ORAL_TABLET | Freq: Every day | ORAL | Status: DC
  Filled 2015-01-19: qty 1

## 2015-01-19 MED ORDER — LETROZOLE 2.5 MG PO TABS: 2.5000 mg | ORAL_TABLET | Freq: Every day | ORAL | Status: DC

## 2015-01-19 MED ORDER — SPIRONOLACTONE 25 MG PO TABS
25.0000 mg | ORAL_TABLET | Freq: Every day | ORAL | Status: DC
  Filled 2015-01-19: qty 1

## 2015-01-19 NOTE — Progress Notes (Signed)
Pt arrived to  2W12,placed on monitor,  VS bp 105/78, sats 100% on room air, heart rate atrial fib 125 on monitor Dr Rayann Heman in room to see patient will monitor patient. Lalana Wachter, Bettina Gavia RN

## 2015-01-19 NOTE — H&P (Signed)
Patient ID: Latrease Kunde MRN: [id number], DOB/AGE: 1941-08-13   Admit date: 01/19/2015   Primary Physician: Annye Asa, MD Primary Cardiologist: Dr. Johnsie Cancel Dr. Rayann Heman (EP)  Pt. Profile:  Darlene Harrell is a 74 y.o. female with a history of HTN, HLD, obesity, chronic systolic CHF (EF 86%), breast cancer and atrial fibrillation who presented to [facility] today for Tikosyn loading and possible DCCV.   She reports having an episode of atrial fibrillation in 2013 in the setting of CHF/ pneumonia. She did not require cardioversion at that time but was temporarily treated with amiodarone (which she tolerated). After several months, amiodarone was discontinued. She is unaware of any further afib until 10/15 when she presented to Dr Antonieta Pert office and was found to have again returned to afib. She developed progressive shortness of breath and was admitted to [facility] with acute systolic CHF in the setting of atrial fibrillation with RVR. She was diuresed with IV Lasix. She was placed on Eliquis for anticoagulation. She underwent DCCV which was unsuccessful 09/09/14. She was initiated on amiodarone. Plan was to pursue cardioversion on amiodarone after 3 weeks of uninterrupted anticoagulation. She went to [facility] in December 12/2015and DCCV was delayed.She developed worsening CHF and was therefore hospitalized at [facility] in [place]. She underwent cath which revealed no significant CAD, EF by LV gram 25-30% Estimated at 30-35% by echo with moderate LAE. Aldactone was added and for some reason her amiodarone was stopped. She reports having a sleep study 12/15 in [place] and she did not have sleep apnea. Dr. Jackalyn Lombard recommendation's from the visit from 1/28 was possibility of enrollment in genetic HF as an option and Tikosyn for possible conversion to to SR. Amiodarone level was low when drawn on last visit. It appears she did not tolerate amiodarone due to increase in  TSH and liver enzymes. She is interested in [place]. Tolerating Noac without bleeding issues. She was seen in the office by Dr. Johnsie Cancel on 01/16/15 and she continued to have class 3 symptoms with relative hypotension.  Apparently has been turned down for Genetic AF. It was decided to proceed with admission for tikosyn load and DCCV.     Problem List  Past Medical History  Diagnosis Date  . Breast cancer 2010    a. s/p mastectomy/chemo/XRT to R breast, Dr. Marin Olp.  . Diabetes mellitus   . Hypertension   . Depression   . Syncopal episodes   . Arthritis   . High cholesterol   . Blood in stool     a. due to hemorrhoids.  . Persistent atrial fibrillation     a. In setting of Pneumonia 6/13 - Echocardiogram 03/30/12: Mild LVH, normal LV function, EF 60-65%, no wall motion abnormalities, mild MR, mild LAE, PASP 32.  . LV dysfunction 09/09/2014    EF40%  . Overweight     Past Surgical History  Procedure Laterality Date  . Tonsillectomy    . Breast surgery  2010 and 2011     biopsy and noted mastectomy  . Tee without cardioversion N/A 09/09/2014    Procedure: TRANSESOPHAGEAL ECHOCARDIOGRAM (TEE);  Surgeon: Josue Hector, MD;  Location: [facility] ENDOSCOPY;  Service: Cardiovascular;  Laterality: N/A;  . Cardioversion N/A 09/09/2014    Procedure: CARDIOVERSION;  Surgeon: Josue Hector, MD;  Location: [facility] ENDOSCOPY;  Service: Cardiovascular;  Laterality: N/A;     Allergies  No Known Allergies   Home Medications  Prior to Admission medications   Medication Sig Start Date  End Date Taking? Authorizing Provider  apixaban (ELIQUIS) 5 MG TABS tablet Take 1 tablet (5 mg total) by mouth 2 (two) times daily. 09/09/14   Isaiah Serge, NP  atorvastatin (LIPITOR) 20 MG tablet Take 1 tablet (20 mg total) by mouth daily. 10/03/14   Liliane Shi, PA-C  Blood Glucose Calibration (ONETOUCH VERIO) SOLN 1 application by In Vitro route as directed. 12/26/14   Midge Minium, MD  carvedilol (COREG) 25 MG  tablet Take 1 tablet (25 mg total) by mouth 2 (two) times daily with a meal. 11/25/14   Josue Hector, MD  Cholecalciferol (VITAMIN D) 2000 UNITS tablet Take 2,000 Units by mouth daily.    Historical Provider, MD  clonazePAM (KLONOPIN) 0.5 MG tablet Take 1 tablet (0.5 mg total) by mouth at bedtime as needed. Patient taking differently: Take 0.5 mg by mouth at bedtime as needed (sleep).  10/08/14   Midge Minium, MD  furosemide (LASIX) 40 MG tablet Take 1 tablet (40 mg total) by mouth daily. 09/09/14   Isaiah Serge, NP  glimepiride (AMARYL) 4 MG tablet Take 1 tablet (4 mg total) by mouth daily before breakfast. 09/23/14   Midge Minium, MD  glucose blood (ONETOUCH VERIO) test strip Test 1-2x daily due to labile blood sugars Dx:E11.9 12/26/14   Midge Minium, MD  ibuprofen (ADVIL,MOTRIN) 200 MG tablet Take 200 mg by mouth every 6 (six) hours as needed (pain).    Historical Provider, MD  letrozole (FEMARA) 2.5 MG tablet Take 1 tablet (2.5 mg total) by mouth daily. 12/01/14   Volanda Napoleon, MD  lisinopril (PRINIVIL,ZESTRIL) 10 MG tablet TAKE 1 TABLET BY MOUTH DAILY 01/13/15   Josue Hector, MD  Magnesium 400 MG TABS Take 400 mg by mouth 2 (two) times daily. 01/16/15   Fay Records, MD  metFORMIN (GLUCOPHAGE) 1000 MG tablet TAKE ONE TABLET BY MOUTH TWICE DAILY WITH  A  MEAL 08/13/14   Midge Minium, MD  nicotine polacrilex (NICORETTE) 4 MG gum Take 4 mg by mouth daily as needed for smoking cessation.     Historical Provider, MD  [facility] DELICA LANCETS 00X MISC Use as directed to test sugars 12/26/14   Midge Minium, MD  spironolactone (ALDACTONE) 25 MG tablet Take 25 mg by mouth daily.    Historical Provider, MD    Family History  Family History  Problem Relation Age of Onset  . Stroke Mother   . Hypertension Mother   . Mental retardation Mother   . Diabetes Mother   . Alcohol abuse Father   . Hypertension Father   . Diabetes Father   . Diabetes Son   . Kidney failure  Son   . Early death Son   . Hyperlipidemia Son   . Heart disease Mother    Family Status  Relation Status Death Age  . Mother Deceased   . Father Deceased   . Son Alive      Social History  History   Social History  . Marital Status: Divorced    Spouse Name: N/A  . Number of Children: N/A  . Years of Education: N/A   Occupational History  . Not on file.   Social History Main Topics  . Smoking status: Former Smoker -- 1.00 packs/day for 48 years    Types: Cigarettes    Start date: 09/03/1960    Quit date: 10/11/2014  . Smokeless tobacco: Never Used     Comment: quit "off  and on". quit again  10/11/14.  She presently chews nicorette gum.  Marland Kitchen Alcohol Use: No  . Drug Use: No  . Sexual Activity: No   Other Topics Concern  . Not on file   Social History Narrative   Lives in [place] but spends some time in [place] visiting family (2-3 months per year)    . All other systems reviewed and are otherwise negative except as noted above.  Physical Exam  There were no vitals taken for this visit.  General: Pleasant, NAD Psych: Normal affect. Neuro: Alert and oriented X 3. Moves all extremities spontaneously. HEENT: Normal  Neck: Supple without bruits or JVD. Lungs:  Resp regular and unlabored, CTA. Heart: tachycardic irregular rhythm, + edema Abdomen: Soft, non-tender, non-distended, BS + x 4.  Extremities: No clubbing, cyanosis  DP/PT/Radials 2+ and equal bilaterally.  Labs  No results for input(s): CKTOTAL, CKMB, TROPONINI in the last 72 hours. Lab Results  Component Value Date   WBC 6.9 01/02/2015   HGB 11.0* 01/02/2015   HCT 35.0 01/02/2015   MCV 87 01/02/2015   PLT 255 01/02/2015    Recent Labs Lab 01/19/15 1307  NA 141  K 4.1  CL 106  CO2 29  BUN 22  CREATININE 1.19*  CALCIUM 9.7  GLUCOSE 136*   Lab Results  Component Value Date   CHOL 88 09/06/2014   HDL 26* 09/06/2014   LDLCALC 44 09/06/2014   TRIG 89 09/06/2014   No results found  for: DDIMER   Radiology/Studies  Mm Digital Screening Unilat L  01/12/2015   CLINICAL DATA:  Screening.  EXAM: DIGITAL SCREENING UNILATERAL LEFT MAMMOGRAM WITH CAD  COMPARISON:  Previous exam(s).  ACR Breast Density Category b: There are scattered areas of fibroglandular density.  FINDINGS: The patient has had a right mastectomy. There are no findings suspicious for malignancy. Images were processed with CAD.  IMPRESSION: No mammographic evidence of malignancy. A result letter of this screening mammogram will be mailed directly to the patient.  RECOMMENDATION: Screening mammogram in one year. (Code:SM-B-01Y)  BI-RADS CATEGORY  1: Negative.   Electronically Signed   By: Nolon Nations M.D.   On: 01/12/2015 11:41    ECG reveals afib with RVR, LBBB, Qtc 437  01/16/15  ASSESSMENT AND PLAN   Kelce Bouton is a 74 y.o. female with a history of HTN, HLD, obesity, chronic systolic CHF (EF 26%), breast cancer and atrial fibrillation who presented to [facility] today for Tikosyn loading and possible DCCV.   Persistent atrial fibrillation-  -- Creat clearance 59.62. Just under cut off for 500mg  Tikosyn dosing. Will initiate 375mg  BID and follow QTc.  WIll consider increasing to 500 mcg BID depending on progress -- K 4.1 and mag is 1.9. Monitor electrolytes closely.  -- Will add dilt 30mg  q6. Watch BP  Chronic systolic CHF- Estimated at 30-35% by echo with moderate LAE on 2 D ECHO in [place].  -- No s/s volume overload.  -- Continue BB and ACE and lasix and spiro  DM- continue home meds   Signed, Eileen Stanford, PA-C 01/19/2015, 5:39 PM  Pager [phone]   I have seen, examined the patient, and reviewed the above assessment and plan. On exam, tachycardic irregular rhythm. Changes to above are made where necessary.  Qtc is stable but difficult to interpret in the setting of LBBB and RVR.  My calculation is 437 msec.  Start tikosyn and monitor closely.  Continue eliquis IF she does not  cardiovert, would  plan [facility] on Wed. If she fails medical therapy with Phyllis Ginger, she would consider CRT-P with AVN ablation  Co Sign: Thompson Grayer, MD 01/19/2015 6:21 PM

## 2015-01-19 NOTE — Progress Notes (Signed)
Pharmacy Consult for Dofetilide (Tikosyn) Initiation  Admit Complaint: 74 y.o. female admitted 01/19/2015 with atrial fibrillation to be initiated on dofetilide.   Assessment:  Patient Exclusion Criteria: If any screening criteria checked as "Yes", then  patient  should NOT receive dofetilide until criteria item is corrected. If "Yes" please indicate correction plan.  YES  NO Patient  Exclusion Criteria Correction Plan  []  [x]  Baseline QTc interval is greater than or equal to 440 msec. IF above YES box checked dofetilide contraindicated unless patient has ICD; then may proceed if QTc 500-550 msec or with known ventricular conduction abnormalities may proceed with QTc 550-600 msec. QTc =   437 msec.    []  [x]  Magnesium level is less than 1.8 mEq/l : Last magnesium:  Lab Results  Component Value Date   MG 1.9 01/19/2015         []  [x]  Potassium level is less than 4 mEq/l : Last potassium:  Lab Results  Component Value Date   K 4.1 01/19/2015         []  [x]  Patient is known or suspected to have a digoxin level greater than 2 ng/ml: No results found for: DIGOXIN    []  [x]  Creatinine clearance less than 20 ml/min (calculated using Cockcroft-Gault, actual body weight and serum creatinine): Estimated Creatinine Clearance: 45.1 mL/min (by C-G formula based on Cr of 1.19).    []  [x]  Patient has received drugs known to prolong the QT intervals within the last 48 hours(phenothiazines, tricyclics or tetracyclic antidepressants, erythromycin, H-1 antihistamines, cisapride, fluoroquinolones, azithromycin). Drugs not listed above may have an, as yet, undetected potential to prolong the QT interval, updated information on QT prolonging agents is available at this website:QT prolonging agents   []  [x]  Patient received a dose of hydrochlorothiazide (Oretic) alone or in any combination including triamterene (Dyazide, Maxzide) in the last 48 hours.   []  [x]  Patient received a medication known to  increase dofetilide plasma concentrations prior to initial dofetilide dose:  . Trimethoprim (Primsol, Proloprim) in the last 36 hours . Verapamil (Calan, Verelan) in the last 36 hours or a sustained release dose in the last 72 hours . Megestrol (Megace) in the last 5 days  . Cimetidine (Tagamet) in the last 6 hours . Ketoconazole (Nizoral) in the last 24 hours . Itraconazole (Sporanox) in the last 48 hours  . Prochlorperazine (Compazine) in the last 36 hours    []  [x]  Patient is known to have a history of torsades de pointes; congenital or acquired long QT syndromes.   []  [x]  Patient has received a Class 1 antiarrhythmic with less than 2 half-lives since last dose. (Disopyramide, Quinidine, Procainamide, Lidocaine, Mexiletine, Flecainide, Propafenone)   []  [x]  Patient has received amiodarone therapy in the past 3 months or amiodarone level is greater than 0.3 ng/ml.    Patient has been appropriately anticoagulated with Eliquis.  Ordering provider was confirmed at [email or web address] if they are not listed on the [place] Prescribers list.  Goal of Therapy: Follow renal function, electrolytes, potential drug interactions, and dose adjustment. Provide education and 1 week supply at discharge.  Plan:  [x]   Physician selected initial dose within range recommended for patients level of renal function - will monitor for response. (375 mg BID)  []   Physician selected initial dose outside of range recommended for patients level of renal function - will discuss if the dose should be altered at this time.    2. Follow up QTc after the first 5 doses, renal  function, electrolytes (K & Mg) daily x 3     days, dose adjustment, success of initiation and facilitate 1 week discharge supply as     clinically indicated.  3. Initiate Tikosyn education video (Call [phone] and ask for video # [phone]).  4. Place Enrollment Form on the chart for discharge supply of dofetilide.   Uvaldo Rising,  BCPS  Clinical Pharmacist Pager [phone]  01/19/2015 6:53 PM

## 2015-01-20 DIAGNOSIS — I4891 Unspecified atrial fibrillation: Secondary | ICD-10-CM

## 2015-01-20 LAB — BASIC METABOLIC PANEL
Anion gap: 8 (ref 5–15)
BUN: 23 mg/dL (ref 6–23)
CALCIUM: 9.4 mg/dL (ref 8.4–10.5)
CO2: 26 mmol/L (ref 19–32)
Chloride: 105 mmol/L (ref 96–112)
Creatinine, Ser: 1.3 mg/dL — ABNORMAL HIGH (ref 0.50–1.10)
GFR calc Af Amer: 47 mL/min — ABNORMAL LOW (ref >90)
GFR calc non Af Amer: 40 mL/min — ABNORMAL LOW (ref >90)
GLUCOSE: 69 mg/dL — AB (ref 70–99)
Potassium: 4.3 mmol/L (ref 3.5–5.1)
Sodium: 139 mmol/L (ref 135–145)

## 2015-01-20 LAB — GLUCOSE, CAPILLARY
Glucose-Capillary: 90 mg/dL (ref 70–99)
Glucose-Capillary: 174 mg/dL — ABNORMAL HIGH (ref 70–99)

## 2015-01-20 LAB — MAGNESIUM: Magnesium: 1.9 mg/dL (ref 1.5–2.5)

## 2015-01-20 NOTE — Progress Notes (Signed)
MD notified about QT/QTc 428/549 after first dose of Tikosyn. Dr. Tommi Rumps ordered to give a.m. dose as ordered. Will continue to monitor.  Domingo Dimes RN

## 2015-01-20 NOTE — Progress Notes (Signed)
QTC greater than 500, EKG reads QTC as 515, notified Amber, no new orders at this time, Amber will adjust dosage if needed.  Rowe Pavy, RN

## 2015-01-20 NOTE — Progress Notes (Signed)
Spoke with Dr. Rayann Heman, ok to give Tikosyn with EKG QTC of 515, EKG not in epic or chart for Dr. Rayann Heman to review, he calcuated the QTC on telemetry and got 480, gave verbal order to give scheduled Tikosyn dose tonight.  Rowe Pavy, RN

## 2015-01-20 NOTE — Care Management Note (Signed)
    Page 1 of 1   01/22/2015     4:16:08 PM CARE MANAGEMENT NOTE 01/22/2015  Patient:  Darlene Harrell,Darlene Harrell   Account Number:  [id number]  Date Initiated:  01/20/2015  Documentation initiated by:  Marvetta Gibbons  Subjective/Objective Assessment:   Pt admitted with afib- Tikosyn initiation     Action/Plan:   PTA pt lived at home   Anticipated DC Date:  01/22/2015   Anticipated DC Plan:  [place]  CM consult  Medication Assistance      Choice offered to / List presented to:             Status of service:  Completed, signed off Medicare Important Message given?  YES (If response is "NO", the following Medicare IM given date fields will be blank) Date Medicare IM given:  01/22/2015 Medicare IM given by:  Marvetta Gibbons Date Additional Medicare IM given:   Additional Medicare IM given by:    Discharge Disposition:  HOME/SELF CARE  Per UR Regulation:  Reviewed for med. necessity/level of care/duration of stay  If discussed at [place] of Stay Meetings, dates discussed:    Comments:  01/20/15- 1430- Marvetta Gibbons RN, BSN [phone] Referral for Tikosyn assistance- benefits check completed-- PT COPAY WILL BE $70.59- $343.99 DEDUCTIBLE HAS NOT BEEN MET AND WILL BE ADDED TO COPAY- [employer] NOT REQUIRED Spoke with pt at bedside- per conversation benefits explained to pt- pt states that she is also on Eliquis and was approved for pt assistance with drug company for eliquis- explained to pt that she may also try pt assistance with Tikosyn may be approved for it also due to income level- since she is already under one assistance program- pt to f/u on this with MD office- pt reports that she has already spoken to someone regarding this and has some sort of paperwork at home- Pt will need 7 day supply of Tikosyn on discharge- MD please write 7 day script (no refills) to send to main pharmacy- pt uses [facility] on [address]- which does participate in  [employer] program and can order drug once they receive script for Tikosyn- pt aware  of plan for discharge- regarding Tikosyn.

## 2015-01-20 NOTE — Progress Notes (Signed)
No ekg to review in epic I have reviewed telemetry and QTc at this time is 480.  Continue tikosyn 375 mcg BID.  Thompson Grayer MD, [facility] 01/20/2015 6:19 PM

## 2015-01-20 NOTE — Progress Notes (Signed)
   SUBJECTIVE: The patient is doing well today.  At this time, she denies chest pain, shortness of breath, or any new concerns.  Marland Kitchen apixaban  5 mg Oral BID  . atorvastatin  20 mg Oral q1800  . carvedilol  25 mg Oral BID WC  . clonazePAM  0.5 mg Oral Daily  . dofetilide  375 mcg Oral BID  . glimepiride  4 mg Oral QAC breakfast  . letrozole  2.5 mg Oral Daily  . lisinopril  10 mg Oral Daily  . magnesium oxide  400 mg Oral BID  . metFORMIN  1,000 mg Oral BID WC  . sodium chloride  3 mL Intravenous Q12H      OBJECTIVE: Physical Exam: Filed Vitals:   01/19/15 2305 01/20/15 0054 01/20/15 0438 01/20/15 0653  BP: 96/68 100/70 94/59 91/52   Pulse: 111 94 94   Temp: 98.1 F (36.7 C)  98.1 F (36.7 C)   TempSrc: Oral  Oral   Resp: 18  18   Height:      Weight:      SpO2: 97%  95%     Intake/Output Summary (Last 24 hours) at 01/20/15 7939 Last data filed at 01/19/15 1900  Gross per 24 hour  Intake    240 ml  Output      0 ml  Net    240 ml    Telemetry reveals sinus rhythm  GEN- The patient is well appearing, alert and oriented x 3 today.   Head- normocephalic, atraumatic Eyes-  Sclera clear, conjunctiva pink Ears- hearing intact Oropharynx- clear Neck- supple, no JVP Lymph- no cervical lymphadenopathy Lungs- Clear to ausculation bilaterally, normal work of breathing Heart- Regular rate and rhythm  GI- soft, NT, ND, + BS Extremities- no clubbing, cyanosis, +1 edema Skin- no rash or lesion Psych- euthymic mood, full affect Neuro- strength and sensation are intact  LABS: Basic Metabolic Panel:  Recent Labs  01/19/15 1307 01/20/15 0550  NA 141 139  K 4.1 4.3  CL 106 105  CO2 29 26  GLUCOSE 136* 69*  BUN 22 23  CREATININE 1.19* 1.30*  CALCIUM 9.7 9.4  MG 1.9 1.9   ASSESSMENT AND PLAN:  Active Problems:   Atrial fibrillation with rapid ventricular response   Darlene Harrell is a 74 y.o. female with a history of HTN, HLD, obesity, chronic systolic CHF (EF  03%), breast cancer and persistent atrial fibrillation who is admitted for afib management.  Persistent atrial fibrillation-  She has converted to sinus rhythm with tikosyn 375 mcg BID.  QT remains stable. Keep K >3.9 and Mg.1.8 Continue eliquis  Stop diltiazem now that she is in sinus Anticipate discharge to home Thursday am  Chronic systolic CHF- Estimated at 30-35% by echo with moderate LAE on 2 D ECHO in [place].  -- No s/s volume overload.  -- should improve with sinus rhythm -- hold lasix and spironolactone today due to slight increase in creatinine and hypotension  DM- continue home meds   Thompson Grayer, MD 01/20/2015 9:29 AM

## 2015-01-20 NOTE — Progress Notes (Signed)
Utilization review completed.  

## 2015-01-21 LAB — BASIC METABOLIC PANEL
ANION GAP: 6 (ref 5–15)
BUN: 26 mg/dL — AB (ref 6–23)
CO2: 25 mmol/L (ref 19–32)
CREATININE: 1.22 mg/dL — AB (ref 0.50–1.10)
Calcium: 9.1 mg/dL (ref 8.4–10.5)
Chloride: 103 mmol/L (ref 96–112)
GFR calc Af Amer: 50 mL/min — ABNORMAL LOW (ref >90)
GFR, EST NON AFRICAN AMERICAN: 43 mL/min — AB (ref >90)
GLUCOSE: 68 mg/dL — AB (ref 70–99)
POTASSIUM: 4.3 mmol/L (ref 3.5–5.1)
Sodium: 134 mmol/L — ABNORMAL LOW (ref 135–145)

## 2015-01-21 LAB — GLUCOSE, CAPILLARY
GLUCOSE-CAPILLARY: 82 mg/dL (ref 70–99)
GLUCOSE-CAPILLARY: 162 mg/dL — AB (ref 70–99)

## 2015-01-21 LAB — MAGNESIUM: Magnesium: 2.1 mg/dL (ref 1.5–2.5)

## 2015-01-21 MED ORDER — DOFETILIDE 250 MCG PO CAPS
250.0000 ug | ORAL_CAPSULE | Freq: Two times a day (BID) | ORAL | Status: DC
  Administered 2015-01-21 – 2015-01-22 (×3): 250 ug via ORAL
  Filled 2015-01-21 (×5): qty 1

## 2015-01-21 NOTE — Progress Notes (Signed)
Inpatient Diabetes Program Recommendations  AACE/ADA: New Consensus Statement on Inpatient Glycemic Control (2013)  Target Ranges:  Prepandial:   less than 140 mg/dL      Peak postprandial:   less than 180 mg/dL (1-2 hours)      Critically ill patients:  140 - 180 mg/dL   Results for Darlene Harrell, Darlene Harrell (MRN [id number]) as of 01/21/2015 10:08  Ref. Range 01/20/2015 06:42 01/20/2015 11:34 01/21/2015 06:08  Glucose-Capillary Latest Range: 70-99 mg/dL 90 174 (H) 82    Diabetes history: DM2 Outpatient Diabetes medications: Metformin 1000 mg BID, Amaryl 4 mg daily before breakfast Current orders for Inpatient glycemic control: Metformin 1000 mg BID, Amaryl 4 mg QAM  Inpatient Diabetes Program Recommendations Correction (SSI): Whiile inpatient please consider ordering CBGs with Novolog sensitive correction scale ACHS.  Note: CBGs are not being monitored consistently due to no current order for CBGs.   Thanks, Barnie Alderman, RN, MSN, CCRN, CDE Diabetes Coordinator Inpatient Diabetes Program [phone] (Team Pager from [place] to [place]) [phone] ([facility] office) [phone] [facility] office)

## 2015-01-21 NOTE — Progress Notes (Signed)
   SUBJECTIVE: The patient is doing well today.  At this time, she denies chest pain, shortness of breath, or any new concerns.  Marland Kitchen apixaban  5 mg Oral BID  . atorvastatin  20 mg Oral q1800  . carvedilol  25 mg Oral BID WC  . clonazePAM  0.5 mg Oral Daily  . dofetilide  250 mcg Oral BID  . glimepiride  4 mg Oral QAC breakfast  . letrozole  2.5 mg Oral Daily  . lisinopril  10 mg Oral Daily  . magnesium oxide  400 mg Oral BID  . metFORMIN  1,000 mg Oral BID WC  . sodium chloride  3 mL Intravenous Q12H      OBJECTIVE: Physical Exam: Filed Vitals:   01/20/15 1322 01/20/15 1841 01/20/15 2047 01/21/15 0419  BP: 93/55 104/57 106/61 96/71  Pulse: 83 73 76 71  Temp: 98.2 F (36.8 C)  98 F (36.7 C) 98.2 F (36.8 C)  TempSrc: Oral  Oral Oral  Resp: 18  18 18   Height:      Weight:      SpO2: 94%  95% 96%    Intake/Output Summary (Last 24 hours) at 01/21/15 3568 Last data filed at 01/20/15 1900  Gross per 24 hour  Intake    720 ml  Output    300 ml  Net    420 ml    Telemetry reveals sinus rhythm, nonsustained atach, no ventricular arrhythmias  GEN- The patient is well appearing, alert and oriented x 3 today.   Head- normocephalic, atraumatic Eyes-  Sclera clear, conjunctiva pink Ears- hearing intact Oropharynx- clear Neck- supple, no JVP Lymph- no cervical lymphadenopathy Lungs- Clear to ausculation bilaterally, normal work of breathing Heart- Regular rate and rhythm  GI- soft, NT, ND, + BS Extremities- no clubbing, cyanosis, +1 edema Skin- no rash or lesion Psych- euthymic mood, full affect Neuro- strength and sensation are intact  LABS: Basic Metabolic Panel:  Recent Labs  01/20/15 0550 01/21/15 0515  NA 139 134*  K 4.3 4.3  CL 105 103  CO2 26 25  GLUCOSE 69* 68*  BUN 23 26*  CREATININE 1.30* 1.22*  CALCIUM 9.4 9.1  MG 1.9 2.1   EKG reveals sinus rhythm, QT is more prolonged today  ASSESSMENT AND PLAN:  Active Problems:   Atrial fibrillation with  rapid ventricular response   Darlene Harrell is a 74 y.o. female with a history of HTN, HLD, obesity, chronic systolic CHF (EF 61%), breast cancer and persistent atrial fibrillation who is admitted for afib management.  Persistent atrial fibrillation-  QT is prolonged today.  Decrease tikosyn to 250 mcg BID this am Keep K >3.9 and Mg.1.8 Continue eliquis   Chronic systolic CHF- Estimated at 30-35% by echo with moderate LAE on 2 D ECHO in [place].  -- No s/s volume overload.  -- should improve with sinus rhythm -- hold lasix and spironolactone today due to slight increase in creatinine and hypotension  DM- continue home meds   Thompson Grayer, MD 01/21/2015 8:11 AM

## 2015-01-22 ENCOUNTER — Telehealth: Payer: Self-pay | Admitting: *Deleted

## 2015-01-22 LAB — BASIC METABOLIC PANEL
ANION GAP: 9 (ref 5–15)
BUN: 27 mg/dL — AB (ref 6–23)
CHLORIDE: 101 mmol/L (ref 96–112)
CO2: 22 mmol/L (ref 19–32)
CREATININE: 1.13 mg/dL — AB (ref 0.50–1.10)
Calcium: 9.3 mg/dL (ref 8.4–10.5)
GFR calc Af Amer: 55 mL/min — ABNORMAL LOW (ref >90)
GFR calc non Af Amer: 48 mL/min — ABNORMAL LOW (ref >90)
Glucose, Bld: 111 mg/dL — ABNORMAL HIGH (ref 70–99)
Potassium: 5.1 mmol/L (ref 3.5–5.1)
Sodium: 132 mmol/L — ABNORMAL LOW (ref 135–145)

## 2015-01-22 LAB — GLUCOSE, CAPILLARY
Glucose-Capillary: 95 mg/dL (ref 70–99)
Glucose-Capillary: 188 mg/dL — ABNORMAL HIGH (ref 70–99)

## 2015-01-22 LAB — MAGNESIUM: Magnesium: 2.3 mg/dL (ref 1.5–2.5)

## 2015-01-22 MED ORDER — DOFETILIDE 250 MCG PO CAPS: 250.0000 ug | ORAL_CAPSULE | Freq: Two times a day (BID) | ORAL | Status: DC

## 2015-01-22 NOTE — Progress Notes (Signed)
Medicare Important Message given? YES  (If response is "NO", the following Medicare IM given date fields will be blank)  Date Medicare IM given: 01/22/15 Medicare IM given by:  Dahlia Client [employer]

## 2015-01-22 NOTE — Discharge Instructions (Signed)

## 2015-01-22 NOTE — Progress Notes (Signed)
Maintaining sinus rhythm QTc is stable, no arrhythmias on tele Will plan to discharge if QT this am is stable  Will need blood work/ ekg in 1 week and follow-up in the AF clinic   Thompson Grayer MD, [facility] 01/22/2015 8:42 AM

## 2015-01-22 NOTE — Discharge Summary (Signed)
ELECTROPHYSIOLOGY PROCEDURE DISCHARGE SUMMARY    Patient ID: Darlene Harrell,  MRN: [id number], DOB/AGE: September 27, 1941 74 y.o.  Admit date: 01/19/2015 Discharge date: 01/22/2015  Primary Care Physician: Annye Asa, MD Primary Cardiologist: Johnsie Cancel Electrophysiologist: Riddhi Grether  Primary Discharge Diagnosis:  1.  Persistent atrial fibrillation status post Tikosyn loading this admission  Secondary Discharge Diagnosis:  1.  Breast cancer s/p mastectomy 2.  Diabetes 3.  Hypertension 4.  Hyperlipidemia 5.  NICM  No Known Allergies   Procedures This Admission:  1.  Tikosyn loading  Brief HPI: Darlene Harrell is a 74 y.o. female with a past medical history as noted above.  They were referred to EP in the outpatient setting for treatment options of atrial fibrillation.  Risks, benefits, and alternatives to Tikosyn were reviewed with the patient who wished to proceed.    Hospital Course:  The patient was admitted and Tikosyn was initiated.  Renal function and electrolytes were followed during the hospitalization.  Her QTc prolonged and her Tikosyn dose was reduced to 235mcg twice daily. On day of discharge, her Qtc was stable on 23mcg twice daily (when BBB accounted for).  Her Lasix and Sprinolactone were discontinued.They were monitored until discharge on telemetry which demonstrated sinus rhythm with no ventricular arrhythmias.  On the day of discharge, they were examined by Dr Rayann Heman who considered them stable for discharge to home.  Follow-up has been arranged with Roderic Palau, NP in 1 week and with Dr Rayann Heman in 4 weeks.   Physical Exam: Filed Vitals:   01/21/15 1034 01/21/15 1344 01/21/15 2022 01/22/15 0429  BP: 110/65 101/62 105/77 111/62  Pulse:  67 72 65  Temp:  98.6 F (37 C) 98.1 F (36.7 C) 98.3 F (36.8 C)  TempSrc:  Oral Oral Oral  Resp:  18 18 18   Height:      Weight:      SpO2:  98% 98% 95%    Labs:   Lab Results  Component Value Date   WBC 6.9  01/02/2015   HGB 11.0* 01/02/2015   HCT 35.0 01/02/2015   MCV 87 01/02/2015   PLT 255 01/02/2015     Recent Labs Lab 01/22/15 0554  NA 132*  K 5.1  CL 101  CO2 22  BUN 27*  CREATININE 1.13*  CALCIUM 9.3  GLUCOSE 111*     Discharge Medications:    Medication List    STOP taking these medications        furosemide 40 MG tablet  Commonly known as:  LASIX     spironolactone 25 MG tablet  Commonly known as:  ALDACTONE      TAKE these medications        apixaban 5 MG Tabs tablet  Commonly known as:  ELIQUIS  Take 1 tablet (5 mg total) by mouth 2 (two) times daily.     atorvastatin 20 MG tablet  Commonly known as:  LIPITOR  Take 1 tablet (20 mg total) by mouth daily.     carvedilol 25 MG tablet  Commonly known as:  COREG  Take 1 tablet (25 mg total) by mouth 2 (two) times daily with a meal.     clonazePAM 0.5 MG tablet  Commonly known as:  KLONOPIN  Take 1 tablet (0.5 mg total) by mouth at bedtime as needed.     dofetilide 250 MCG capsule  Commonly known as:  TIKOSYN  Take 1 capsule (250 mcg total) by mouth 2 (two) times daily.  glimepiride 4 MG tablet  Commonly known as:  AMARYL  Take 1 tablet (4 mg total) by mouth daily before breakfast.     glucose blood test strip  Commonly known as:  ONETOUCH VERIO  Test 1-2x daily due to labile blood sugars Dx:E11.9     letrozole 2.5 MG tablet  Commonly known as:  FEMARA  Take 1 tablet (2.5 mg total) by mouth daily.     lisinopril 10 MG tablet  Commonly known as:  PRINIVIL,ZESTRIL  TAKE 1 TABLET BY MOUTH DAILY     Magnesium 400 MG Tabs  Take 400 mg by mouth 2 (two) times daily.     metFORMIN 1000 MG tablet  Commonly known as:  GLUCOPHAGE  TAKE ONE TABLET BY MOUTH TWICE DAILY WITH  A  MEAL     nicotine polacrilex 4 MG gum  Commonly known as:  NICORETTE  Take 4 mg by mouth daily as needed for smoking cessation.     ONETOUCH DELICA LANCETS 55H Misc  Use as directed to test sugars     ONETOUCH VERIO  Soln  1 application by In Vitro route as directed.     Vitamin D 2000 UNITS tablet  Take 2,000 Units by mouth daily.        Disposition:  Discharge Instructions    Diet - low sodium heart healthy    Complete by:  As directed      Increase activity slowly    Complete by:  As directed           Follow-up Information    Follow up with CARROLL,DONNA, NP On 01/29/2015.   Specialty:  Nurse Practitioner   Why:  at [place] - at the [facility] office   Contact information:   [place] [address] [phone]       Follow up with Thompson Grayer, MD On 03/11/2015.   Specialty:  Cardiology   Why:  at [facility] information:   [place] [phone]       Duration of Discharge Encounter: Greater than 30 minutes including physician time.  Signed, Chanetta Marshall, NP 01/22/2015 12:46 PM     Thompson Grayer MD

## 2015-01-22 NOTE — Progress Notes (Signed)
Pt given d/c instructions; IV and tele monitor d/c at this time; pt given 7 day supply of Tikosyn; pt verbalized understanding of d/c instructions; pt to d/c home with son; will cont. To monitor.

## 2015-01-22 NOTE — Telephone Encounter (Signed)
Unable to reach patient at time of TCM call. Left message for patient to return call when available.   

## 2015-01-29 ENCOUNTER — Encounter: Payer: Self-pay | Admitting: Nurse Practitioner

## 2015-01-29 ENCOUNTER — Ambulatory Visit (INDEPENDENT_AMBULATORY_CARE_PROVIDER_SITE_OTHER): Payer: Medicare Other | Admitting: Nurse Practitioner

## 2015-01-29 VITALS — BP 120/78 | HR 70 | Ht 63.0 in | Wt 204.4 lb

## 2015-01-29 DIAGNOSIS — I480 Paroxysmal atrial fibrillation: Secondary | ICD-10-CM | POA: Diagnosis not present

## 2015-01-29 MED ORDER — FUROSEMIDE 40 MG PO TABS
40.0000 mg | ORAL_TABLET | Freq: Every day | ORAL | Status: DC
Start: 2015-01-29 — End: 2015-09-25

## 2015-01-29 NOTE — Progress Notes (Signed)
e   Date:  01/29/2015   ID:  Darlene Harrell, DOB 1941/01/10, MRN [id number]  PCP:  Annye Asa, MD  Cardiologist:  Dr Johnsie Cancel Primary Electrophysiologist:  Dr. Rayann Heman  Chief Complaint  Patient presents with  . Atrial Fibrillation     History of Present Illness: Darlene Harrell is a 74 y.o. female who presents today for electrophysiology evaluation.   She reports having an episode of atrial fibrillation in 2013 in the setting of CHF/ pneumonia.  She did not require cardioversion at that time but was temporarily treated with amiodarone (which she tolerated).  After several months, amiodarone was discontinued.  She is unaware of any further afib until 10/15 when she presented to Dr Antonieta Pert office and was found to have again returned to afib.  She developed progressive shortness of breath and was admitted to [facility] with acute systolic CHF in the setting of atrial fibrillation with RVR. She was diuresed with IV Lasix. She was placed on Eliquis for anticoagulation.  She underwent cardioversion which was unsuccessful 09/09/14.  She was initiated on amiodarone.  Plan was to pursue cardioversion on amiodarone after 3 weeks of uninterrupted anticoagulation.  She went to [facility] in December 12/15and [facility] was delayed. She developed worsening CHF and was therefore hospitalized at [facility] in [place].  She underwent cath which revealed no significant CAD, EF by V gram 25-30% Estimated at 30-35% by echo with moderate LAE.  Aldactone was added and for some reason her amiodarone was stopped.She reports having a sleep study 12/15 in [place] and she did not have sleep apnea.  On last visit, Tikosyn was recommended and she is f/u Tikosyn initiation, admitted 3/21 thru 3/24. Her QTc prolonged and her Tikosyn dose was reduced to 297mcg twice daily. EKG today with NSR 70 bpm, acceptable  QTC 468. Weight up 12 lbs. Lasix/spironolactone stopped on discharge.  Today, she denies symptoms  of chest pain, orthopnea, PND,claudication, dizziness, presyncope, syncope, bleeding, or neurologic sequela. Positive for increase in weight and lower extremity edema.The patient is tolerating medications without difficulties and is otherwise without complaint today.    Past Medical History  Diagnosis Date  . Hypertension   . Depression   . Syncopal episodes   . High cholesterol   . Blood in stool     a. due to hemorrhoids.  . Persistent atrial fibrillation     a. In setting of Pneumonia 6/13 - Echocardiogram 03/30/12: Mild LVH, normal LV function, EF 60-65%, no wall motion abnormalities, mild MR, mild LAE, PASP 32.  . LV dysfunction 09/09/2014    EF40%  . Overweight   . Chronic systolic CHF (congestive heart failure)     "when I had pneumonia"  . CAP (community acquired pneumonia) 5/20123  . Type II diabetes mellitus   . Breast cancer, right breast 2010    a. s/p mastectomy/chemo/XRT to R breast, Dr. Marin Olp.  Marland Kitchen Anxiety    Past Surgical History  Procedure Laterality Date  . Tonsillectomy    . Tee without cardioversion N/A 09/09/2014    Procedure: TRANSESOPHAGEAL ECHOCARDIOGRAM (TEE);  Surgeon: Josue Hector, MD;  Location: [facility] ENDOSCOPY;  Service: Cardiovascular;  Laterality: N/A;  . Cardioversion N/A 09/09/2014    Procedure: CARDIOVERSION;  Surgeon: Josue Hector, MD;  Location: [facility] ENDOSCOPY;  Service: Cardiovascular;  Laterality: N/A;  . Breast biopsy Right 2010   . Mastectomy Right 2011     Current Outpatient Prescriptions  Medication Sig Dispense Refill  . apixaban (ELIQUIS)  5 MG TABS tablet Take 1 tablet (5 mg total) by mouth 2 (two) times daily. 60 tablet 0  . atorvastatin (LIPITOR) 20 MG tablet Take 1 tablet (20 mg total) by mouth daily. 90 tablet 3  . carvedilol (COREG) 25 MG tablet Take 1 tablet (25 mg total) by mouth 2 (two) times daily with a meal. 60 tablet 11  . Cholecalciferol (VITAMIN D) 2000 UNITS tablet Take 2,000 Units by mouth daily.    . clonazePAM  (KLONOPIN) 0.5 MG tablet Take 1 tablet (0.5 mg total) by mouth at bedtime as needed. (Patient taking differently: Take 0.5 mg by mouth at bedtime as needed (sleep). ) 30 tablet 0  . dofetilide (TIKOSYN) 250 MCG capsule Take 1 capsule (250 mcg total) by mouth 2 (two) times daily. 180 capsule 1  . glimepiride (AMARYL) 4 MG tablet Take 1 tablet (4 mg total) by mouth daily before breakfast. 90 tablet 1  . glucose blood (ONETOUCH VERIO) test strip Test 1-2x daily due to labile blood sugars Dx:E11.9 100 each 12  . letrozole (FEMARA) 2.5 MG tablet Take 1 tablet (2.5 mg total) by mouth daily. 180 tablet 0  . lisinopril (PRINIVIL,ZESTRIL) 10 MG tablet TAKE 1 TABLET BY MOUTH DAILY 90 tablet 0  . Magnesium 400 MG TABS Take 400 mg by mouth 2 (two) times daily. 60 tablet 0  . metFORMIN (GLUCOPHAGE) 1000 MG tablet TAKE ONE TABLET BY MOUTH TWICE DAILY WITH  A  MEAL 180 tablet 1  . nicotine polacrilex (NICORETTE) 4 MG gum Take 4 mg by mouth daily as needed for smoking cessation.     [name] DELICA LANCETS 05L MISC Use as directed to test sugars 100 each 3  . Blood Glucose Calibration (ONETOUCH VERIO) SOLN 1 application by In Vitro route as directed. 1 each 6  . furosemide (LASIX) 40 MG tablet Take 1 tablet (40 mg total) by mouth daily. 30 tablet 6   No current facility-administered medications for this visit.    Allergies:   Review of patient's allergies indicates no known allergies.   Social History:  The patient  reports that she quit smoking about 3 months ago. Her smoking use included Cigarettes. She started smoking about 54 years ago. She has a 48 pack-year smoking history. She has never used smokeless tobacco. She reports that she drinks alcohol. She reports that she does not use illicit drugs.   Family History:  The patient's family history includes Alcohol abuse in her father; Diabetes in her father, mother, and son; Early death in her son; Heart disease in her mother; Hyperlipidemia in her son;  Hypertension in her father and mother; Kidney failure in her son; Mental retardation in her mother; Stroke in her mother.    ROS:  Please see the history of present illness.   All other systems are reviewed and negative.    PHYSICAL EXAM: VS:  BP 120/78 mmHg  Pulse 70  Ht 5\' 3"  (1.6 m)  Wt 204 lb 6.4 oz (92.715 kg)  BMI 36.22 kg/m2 , BMI Body mass index is 36.22 kg/(m^2). GEN: overweight, well developed, in no acute distress HEENT: normal Neck: no JVD, carotid bruits, or masses Cardiac: iRRR; no murmurs, rubs, or gallops,no edema  Respiratory:  clear to auscultation bilaterally, normal work of breathing GI: soft, nontender, nondistended, + BS MS: no deformity or atrophy Skin: warm and dry  Neuro:  Strength and sensation are intact Psych: euthymic mood, full affect  EKG:  EKG is ordered today. The ekg  ordered today shows NSR at 70 bpm, 468 ms    Recent Labs: 11/25/2014: TSH 1.26 01/02/2015: ALT 14; Hemoglobin 11.0*; Platelets 255 01/22/2015: BUN 27*; Creatinine 1.13*; Magnesium 2.3; Potassium 5.1; Sodium 132*    Epic records reviewed from recent hospitalization.  ASSESSMENT AND PLAN:  1.  Persistent atrial fibrillation The patient has persistent afib with elevated V rates and associated CHF.  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 Tikosyn initiation with acceptable QTc Samples of tikosyn 250 mg bid( two weeks worth) were given until her mail supply is received   2. Nonischemic CM/ CHF Lasix/spironolactone d/ced in hospital with subsequent 12 lb weight gain Resume lasix 40 ng a day Encouraged to weight daily and decrease salt intake   3. HTN Stable  F/u in afib clinic next week for recheck of fluid status with return to lasix use Will need EKG and Bmet  F/u with Dr. Rayann Heman as scheduled 5/11   Eduard Roux, NP  01/29/2015 11:31 AM     [facility] [address] [place]  [address] [phone] (office) [phone] (fax)

## 2015-01-29 NOTE — Patient Instructions (Signed)
Your physician recommends that you schedule a follow-up appointment on Wednesday April 6th @ 11am. Afib Clinic with Roderic Palau, NP. Located in [facility] at [facility] (near old main entrance off of [address] - 3rd driveway on right). Use Heart and Vascular Parking Deck as you pull in the drive. The parking garage code is  __0007___.  Our number is [phone]  Your physician has recommended you make the following change in your medication:  1)Lasix 40mg  once a day

## 2015-02-03 ENCOUNTER — Other Ambulatory Visit: Payer: Self-pay | Admitting: *Deleted

## 2015-02-03 MED ORDER — APIXABAN 5 MG PO TABS: 5.0000 mg | ORAL_TABLET | Freq: Two times a day (BID) | ORAL | Status: DC

## 2015-02-04 ENCOUNTER — Encounter (HOSPITAL_COMMUNITY): Payer: Self-pay | Admitting: Nurse Practitioner

## 2015-02-04 ENCOUNTER — Other Ambulatory Visit (INDEPENDENT_AMBULATORY_CARE_PROVIDER_SITE_OTHER): Payer: Medicare Other | Admitting: *Deleted

## 2015-02-04 ENCOUNTER — Ambulatory Visit (HOSPITAL_COMMUNITY)
Admission: RE | Admit: 2015-02-04 | Discharge: 2015-02-04 | Disposition: A | Discharge status: Home / Self Care | Payer: Medicare Other | Source: Ambulatory Visit | Attending: Nurse Practitioner | Admitting: Nurse Practitioner

## 2015-02-04 DIAGNOSIS — I481 Persistent atrial fibrillation: Secondary | ICD-10-CM

## 2015-02-04 DIAGNOSIS — I4819 Other persistent atrial fibrillation: Secondary | ICD-10-CM

## 2015-02-04 DIAGNOSIS — I10 Essential (primary) hypertension: Secondary | ICD-10-CM | POA: Diagnosis not present

## 2015-02-04 DIAGNOSIS — I509 Heart failure, unspecified: Secondary | ICD-10-CM | POA: Diagnosis not present

## 2015-02-04 DIAGNOSIS — Z87891 Personal history of nicotine dependence: Secondary | ICD-10-CM | POA: Diagnosis not present

## 2015-02-04 LAB — BASIC METABOLIC PANEL
BUN: 18 mg/dL (ref 6–23)
CALCIUM: 9.8 mg/dL (ref 8.4–10.5)
CHLORIDE: 102 meq/L (ref 96–112)
CO2: 26 meq/L (ref 19–32)
Creatinine, Ser: 1.01 mg/dL (ref 0.40–1.20)
GFR: 57.35 mL/min — ABNORMAL LOW (ref >60.00)
GLUCOSE: 112 mg/dL — AB (ref 70–99)
POTASSIUM: 3.7 meq/L (ref 3.5–5.1)
SODIUM: 137 meq/L (ref 135–145)

## 2015-02-04 LAB — MAGNESIUM: Magnesium: 2.2 mg/dL (ref 1.5–2.5)

## 2015-02-04 NOTE — Progress Notes (Signed)
Patient ID: Darlene Harrell, female   DOB: August 15, 1941, 74 y.o.   MRN: [id number]    e   Date:  02/04/2015   ID:  Darlene Harrell, DOB April 08, 1941, MRN [id number]  PCP:  Darlene Asa, MD  Cardiologist:  Dr Darlene Harrell Primary Electrophysiologist:  Dr. Rayann Harrell  Chief Complaint  Patient presents with  . Atrial Fibrillation     History of Present Illness: Darlene Harrell is a 74 y.o. female who presents today for electrophysiology evaluation.   She reports having an episode of atrial fibrillation in 2013 in the setting of CHF/ pneumonia.  She did not require cardioversion at that time but was temporarily treated with amiodarone (which she tolerated).  After several months, amiodarone was discontinued.  She is unaware of any further afib until 10/15 when she presented to Dr Darlene Harrell office and was found to have again returned to afib.  She developed progressive shortness of breath and was admitted to [facility] with acute systolic CHF in the setting of atrial fibrillation with RVR. She was diuresed with IV Lasix. She was placed on Eliquis for anticoagulation.  She underwent cardioversion which was unsuccessful 09/09/14.  She was initiated on amiodarone.  Plan was to pursue cardioversion on amiodarone after 3 weeks of uninterrupted anticoagulation.  She went to [facility] in December 12/15and [facility] was delayed. She developed worsening CHF and was therefore hospitalized at [facility] in [place].  She underwent cath which revealed no significant CAD, EF by V gram 25-30% Estimated at 30-35% by echo with moderate LAE.  Aldactone was added and for some reason her amiodarone was stopped.She reports having a sleep study 12/15 in [place] and she did not have sleep apnea.  On last visit, 3/31, following initiation of Tikosyn, SR was being maintained but she had gained 12 lbs since lasix and spironolactone was both d/ced in the hospital. She returns today with weight loss of 11 lbs and feeling less  bloated, with restart of lasix. Continues in SR at 68 bpm, QTc at 446ms.  Today, she denies symptoms of chest pain, orthopnea, PND,claudication, dizziness, presyncope, syncope, bleeding, or neurologic sequela. Positive for increase in weight and lower extremity edema.The patient is tolerating medications without difficulties and is otherwise without complaint today.    Past Medical History  Diagnosis Date  . Hypertension   . Depression   . Syncopal episodes   . High cholesterol   . Blood in stool     a. due to hemorrhoids.  . Persistent atrial fibrillation     a. In setting of Pneumonia 6/13 - Echocardiogram 03/30/12: Mild LVH, normal LV function, EF 60-65%, no wall motion abnormalities, mild MR, mild LAE, PASP 32.  . LV dysfunction 09/09/2014    EF40%  . Overweight   . Chronic systolic CHF (congestive heart failure)     "when I had pneumonia"  . CAP (community acquired pneumonia) 5/20123  . Type II diabetes mellitus   . Breast cancer, right breast 2010    a. s/p mastectomy/chemo/XRT to R breast, Dr. Marin Harrell.  Marland Kitchen Anxiety    Past Surgical History  Procedure Laterality Date  . Tonsillectomy    . Tee without cardioversion N/A 09/09/2014    Procedure: TRANSESOPHAGEAL ECHOCARDIOGRAM (TEE);  Surgeon: Darlene Hector, MD;  Location: [facility] ENDOSCOPY;  Service: Cardiovascular;  Laterality: N/A;  . Cardioversion N/A 09/09/2014    Procedure: CARDIOVERSION;  Surgeon: Darlene Hector, MD;  Location: [facility] ENDOSCOPY;  Service: Cardiovascular;  Laterality: N/A;  . Breast biopsy Right  2010   . Mastectomy Right 2011     Current Outpatient Prescriptions  Medication Sig Dispense Refill  . apixaban (ELIQUIS) 5 MG TABS tablet Take 1 tablet (5 mg total) by mouth 2 (two) times daily. 60 tablet 0  . atorvastatin (LIPITOR) 20 MG tablet Take 1 tablet (20 mg total) by mouth daily. 90 tablet 3  . Blood Glucose Calibration (ONETOUCH VERIO) SOLN 1 application by In Vitro route as directed. 1 each 6  . carvedilol  (COREG) 25 MG tablet Take 1 tablet (25 mg total) by mouth 2 (two) times daily with a meal. 60 tablet 11  . Cholecalciferol (VITAMIN D) 2000 UNITS tablet Take 2,000 Units by mouth daily.    . clonazePAM (KLONOPIN) 0.5 MG tablet Take 1 tablet (0.5 mg total) by mouth at bedtime as needed. (Patient taking differently: Take 0.5 mg by mouth at bedtime as needed (sleep). ) 30 tablet 0  . dofetilide (TIKOSYN) 250 MCG capsule Take 1 capsule (250 mcg total) by mouth 2 (two) times daily. 180 capsule 1  . furosemide (LASIX) 40 MG tablet Take 1 tablet (40 mg total) by mouth daily. 30 tablet 6  . glimepiride (AMARYL) 4 MG tablet Take 1 tablet (4 mg total) by mouth daily before breakfast. 90 tablet 1  . glucose blood (ONETOUCH VERIO) test strip Test 1-2x daily due to labile blood sugars Dx:E11.9 100 each 12  . letrozole (FEMARA) 2.5 MG tablet Take 1 tablet (2.5 mg total) by mouth daily. 180 tablet 0  . lisinopril (PRINIVIL,ZESTRIL) 10 MG tablet TAKE 1 TABLET BY MOUTH DAILY 90 tablet 0  . Magnesium 400 MG TABS Take 400 mg by mouth 2 (two) times daily. 60 tablet 0  . metFORMIN (GLUCOPHAGE) 1000 MG tablet TAKE ONE TABLET BY MOUTH TWICE DAILY WITH  A  MEAL 180 tablet 1  . nicotine polacrilex (NICORETTE) 4 MG gum Take 4 mg by mouth daily as needed for smoking cessation.     [name] DELICA LANCETS 78E MISC Use as directed to test sugars 100 each 3   No current facility-administered medications for this encounter.    Allergies:   Review of patient's allergies indicates no known allergies.   Social History:  The patient  reports that she quit smoking about 3 months ago. Her smoking use included Cigarettes. She started smoking about 54 years ago. She has a 48 pack-year smoking history. She has never used smokeless tobacco. She reports that she drinks alcohol. She reports that she does not use illicit drugs.   Family History:  The patient's family history includes Alcohol abuse in her father; Diabetes in her father,  mother, and son; Early death in her son; Heart disease in her mother; Hyperlipidemia in her son; Hypertension in her father and mother; Kidney failure in her son; Mental retardation in her mother; Stroke in her mother.    ROS:  Please see the history of present illness.   All other systems are reviewed and negative.    PHYSICAL EXAM: VS:  BP 120/70 mmHg  Pulse 68  Ht 5\' 3"  (1.6 m)  Wt 193 lb 6.4 oz (87.726 kg)  BMI 34.27 kg/m2 , BMI Body mass index is 34.27 kg/(m^2). GEN: overweight, well developed, in no acute distress HEENT: normal Neck: no JVD, carotid bruits, or masses Cardiac: iRRR; no murmurs, rubs, or gallops,no edema  Respiratory:  clear to auscultation bilaterally, normal work of breathing GI: soft, nontender, nondistended, + BS MS: no deformity or atrophy Skin: warm and  dry  Neuro:  Strength and sensation are intact Psych: euthymic mood, full affect  EKG:  EKG is ordered today. The ekg ordered today shows NSR at 68 bpm, Qtc 420ms. Weight 3/31- 204lb   Recent Labs: 11/25/2014: TSH 1.26 01/02/2015: ALT 14; Hemoglobin 11.0*; Platelets 255 01/22/2015: BUN 27*; Creatinine 1.13*; Magnesium 2.3; Potassium 5.1; Sodium 132*    Epic records reviewed from recent hospitalization.  ASSESSMENT AND PLAN:  1.  Persistent atrial fibrillation The patient has persistent afib with elevated V rates and associated CHF.  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  Tikosyn initiation 3/21, continues in SR. Bmet, mag pending today. QTc at 499 ms. Will await results of labs and will discuss with PharmD to see if ok to continue current dose of tikosyn or if reduction in dose is warranted.  2. Nonischemic CM/ CHF Lasix/spironolactone d/ced in hospital with subsequent 12 lb weight gain Good weight loss with resumption of lasix.  Continue current dose unless today's labs dictate a change Encouraged to weight daily and decrease salt intake   3.  HTN Stable  F/u with Dr. Rayann Harrell as scheduled 5/11  May need f/u EKG or change in meds, prior to above appointment with Dr. Rayann Harrell,  await lab results.   Eduard Roux, NP  02/04/2015 11:54 AM

## 2015-02-04 NOTE — Patient Instructions (Signed)
We will call you with lab results and any further follow up will be scheduled after lab results

## 2015-02-04 NOTE — Addendum Note (Signed)
Addended by: Eulis Foster on: 02/04/2015 01:39 PM   Modules accepted: Orders

## 2015-02-05 ENCOUNTER — Telehealth (HOSPITAL_COMMUNITY): Payer: Self-pay | Admitting: *Deleted

## 2015-02-05 ENCOUNTER — Other Ambulatory Visit (HOSPITAL_COMMUNITY): Payer: Self-pay | Admitting: *Deleted

## 2015-02-05 DIAGNOSIS — I480 Paroxysmal atrial fibrillation: Secondary | ICD-10-CM

## 2015-02-05 MED ORDER — POTASSIUM CHLORIDE ER 20 MEQ PO TBCR: 20.0000 meq | EXTENDED_RELEASE_TABLET | Freq: Every day | ORAL | Status: DC

## 2015-02-05 NOTE — Addendum Note (Signed)
Encounter addended by: Yehuda Mao on: 02/05/2015  9:02 AM<BR>     Documentation filed: Charges VN

## 2015-02-05 NOTE — Telephone Encounter (Signed)
-----   Message from Sherran Needs, NP sent at 02/05/2015  9:25 AM EDT ----- Please check to see if pt has Kt oh hand. If not call in 20 meq a day, recheck kt in one week, will need EKG by triage nurse and fax here for review.

## 2015-02-09 ENCOUNTER — Telehealth: Payer: Self-pay | Admitting: Cardiovascular Disease

## 2015-02-09 NOTE — Telephone Encounter (Signed)
Walk-in Pt form " [employer]" form dropped off Darlene Harrell back Tuesday will give to her then

## 2015-02-10 ENCOUNTER — Telehealth: Payer: Self-pay

## 2015-02-10 ENCOUNTER — Other Ambulatory Visit: Payer: Self-pay | Admitting: *Deleted

## 2015-02-10 MED ORDER — APIXABAN 5 MG PO TABS: 5.0000 mg | ORAL_TABLET | Freq: Two times a day (BID) | ORAL | Status: DC

## 2015-02-10 NOTE — Telephone Encounter (Signed)
PT'S SON AWARE   ELIQUIS ASSISTANCE FORM COMPLETED AND  FAXED   PER SON   HAS  ALREADY  HAD  TIKOSYN  PAPERWORK  DONE JUST  WAITING    FOR MED  TO COME IN   WILL CHECK Friday WHILE IN OFFICE  FOR  MORE TIKOSYN  IF MED HAS NOT  ARRIVED .Adonis Housekeeper

## 2015-02-10 NOTE — Telephone Encounter (Signed)
Follow Up  Pt returning Christine's phone call. Please call back and discuss.

## 2015-02-10 NOTE — Telephone Encounter (Signed)
LM TO CALL BACK ./CY 

## 2015-02-13 ENCOUNTER — Other Ambulatory Visit (INDEPENDENT_AMBULATORY_CARE_PROVIDER_SITE_OTHER): Payer: Medicare Other | Admitting: *Deleted

## 2015-02-13 ENCOUNTER — Ambulatory Visit (INDEPENDENT_AMBULATORY_CARE_PROVIDER_SITE_OTHER): Payer: Medicare Other

## 2015-02-13 VITALS — BP 129/63 | HR 65 | Resp 18 | Ht 63.0 in | Wt 192.0 lb

## 2015-02-13 DIAGNOSIS — E559 Vitamin D deficiency, unspecified: Secondary | ICD-10-CM

## 2015-02-13 DIAGNOSIS — I4819 Other persistent atrial fibrillation: Secondary | ICD-10-CM

## 2015-02-13 DIAGNOSIS — E785 Hyperlipidemia, unspecified: Secondary | ICD-10-CM | POA: Diagnosis not present

## 2015-02-13 DIAGNOSIS — Z79899 Other long term (current) drug therapy: Secondary | ICD-10-CM | POA: Diagnosis not present

## 2015-02-13 DIAGNOSIS — Z780 Asymptomatic menopausal state: Secondary | ICD-10-CM

## 2015-02-13 DIAGNOSIS — I481 Persistent atrial fibrillation: Secondary | ICD-10-CM

## 2015-02-13 DIAGNOSIS — E119 Type 2 diabetes mellitus without complications: Secondary | ICD-10-CM | POA: Diagnosis not present

## 2015-02-13 DIAGNOSIS — Z5181 Encounter for therapeutic drug level monitoring: Secondary | ICD-10-CM

## 2015-02-13 DIAGNOSIS — Z1231 Encounter for screening mammogram for malignant neoplasm of breast: Secondary | ICD-10-CM

## 2015-02-13 DIAGNOSIS — Z0000 Encounter for general adult medical examination without abnormal findings: Secondary | ICD-10-CM

## 2015-02-13 DIAGNOSIS — I480 Paroxysmal atrial fibrillation: Secondary | ICD-10-CM

## 2015-02-13 LAB — BASIC METABOLIC PANEL
BUN: 22 mg/dL (ref 6–23)
CO2: 23 meq/L (ref 19–32)
Calcium: 9.5 mg/dL (ref 8.4–10.5)
Chloride: 103 mEq/L (ref 96–112)
Creatinine, Ser: 0.94 mg/dL (ref 0.40–1.20)
GFR: 62.3 mL/min (ref >60.00)
GLUCOSE: 284 mg/dL — AB (ref 70–99)
Potassium: 4.3 mEq/L (ref 3.5–5.1)
SODIUM: 133 meq/L — AB (ref 135–145)

## 2015-02-13 MED ORDER — LISINOPRIL 10 MG PO TABS: 10.0000 mg | ORAL_TABLET | Freq: Every day | ORAL | Status: DC

## 2015-02-13 NOTE — Progress Notes (Signed)
1.) Reason for visit: EKG  2.) Name of MD requesting visit: Roderic Palau NP  3.) H&P: Patient has a history of atrial fibrillation patient started on Tikosyn on 01/29/2015. Patient had unsuccessful cardioversion 09/09/2014. Patient had a heart cath 09/2014 in [place]. With last office visit QTc was at 422ms. After labs that day patient was started on Potassium 20 mEq by mouth daily and follow up with a EKG today.  4.) ROS related to problem: Patient's EKG showed normal sinus rhythm, ST and T wave abnormality. QTc 485ms. Patient's VS are BP 129/63, HR 65, Resp. 18, and O2 100% on room air. Patient denies any chest pain or SOB at this time. Patient requested a refill for lisinopril.   5.) Assessment and plan per MD: Faxed EKG to Roderic Palau, NP as requested. Refill for lisinopril sent to patient's pharmacy. Dr. Radford Pax (DOD) signed EKG.

## 2015-02-13 NOTE — Patient Instructions (Signed)
Medication Instructions:  Your physician recommends that you continue on your current medications as directed. Please refer to the Current Medication list given to you today.   Labwork: NONE  Testing/Procedures: NONE  Follow-Up: Your physician recommends that you keep your follow up appointment.   Any Other Special Instructions Will Be Listed Below (If Applicable).

## 2015-02-16 ENCOUNTER — Telehealth (HOSPITAL_COMMUNITY): Payer: Self-pay | Admitting: *Deleted

## 2015-02-16 ENCOUNTER — Other Ambulatory Visit: Payer: Self-pay | Admitting: Family Medicine

## 2015-02-16 ENCOUNTER — Telehealth: Payer: Self-pay | Admitting: Internal Medicine

## 2015-02-16 MED ORDER — CLONAZEPAM 0.5 MG PO TABS: 0.5000 mg | ORAL_TABLET | Freq: Every evening | ORAL | Status: DC | PRN

## 2015-02-16 MED ORDER — METFORMIN HCL 1000 MG PO TABS: ORAL_TABLET | ORAL | Status: DC

## 2015-02-16 NOTE — Telephone Encounter (Signed)
Med filled and faxed.  

## 2015-02-16 NOTE — Telephone Encounter (Signed)
New message      Pt want samples of tikosyn---240mcg.  Please call and let her know if we have samples

## 2015-02-16 NOTE — Telephone Encounter (Signed)
Metformin filled. Please advise if ok for a 90 day rx for the Clonazepam.   Last OV 12/2014 Clonazepam last filled 10-08-14  #30 with 0

## 2015-02-16 NOTE — Telephone Encounter (Signed)
Pt notified of lab results. Patient was asking about tikosyn samples - told her to contact [facility] for these as we do not keep samples in our office. States she will call them today.

## 2015-02-16 NOTE — Telephone Encounter (Signed)
[place] for #90 of clonazepam

## 2015-02-16 NOTE — Telephone Encounter (Signed)
Caller name: Brandy Relation to pt: self Call back number: [phone] Pharmacy: [facility] on [address]  Reason for call:   Now using [facility] [address]. Needs new rx of metformin, clonazepam. 90 day supply of both.

## 2015-02-17 ENCOUNTER — Telehealth: Payer: Self-pay | Admitting: Internal Medicine

## 2015-02-17 NOTE — Telephone Encounter (Signed)
ERROR

## 2015-02-17 NOTE — Telephone Encounter (Signed)
Patient son called to get eliquis 5 mg and tikosyn 250 mg samples until they hear from the patient assistance program placed samples up front

## 2015-02-17 NOTE — Telephone Encounter (Signed)
Walk -In pt form Human resources officer" dropped off will give to [employer] 4.20.16/km

## 2015-03-04 ENCOUNTER — Telehealth: Payer: Self-pay | Admitting: *Deleted

## 2015-03-04 NOTE — Telephone Encounter (Signed)
Called patient to let her know that the [employer] had arrived from the company. I will place at the front desk.

## 2015-03-11 ENCOUNTER — Encounter: Payer: Medicare Other | Admitting: Internal Medicine

## 2015-03-24 ENCOUNTER — Encounter: Payer: Self-pay | Admitting: Family Medicine

## 2015-03-24 ENCOUNTER — Ambulatory Visit (INDEPENDENT_AMBULATORY_CARE_PROVIDER_SITE_OTHER): Payer: Medicare Other | Admitting: Family Medicine

## 2015-03-24 VITALS — BP 142/80 | HR 72 | Temp 98.5°F | Resp 16 | Wt 190.2 lb

## 2015-03-24 DIAGNOSIS — E119 Type 2 diabetes mellitus without complications: Secondary | ICD-10-CM

## 2015-03-24 LAB — CBC WITH DIFFERENTIAL/PLATELET
Basophils Absolute: 0 10*3/uL (ref 0.0–0.1)
Basophils Relative: 0.5 % (ref 0.0–3.0)
EOS ABS: 0.2 10*3/uL (ref 0.0–0.7)
EOS PCT: 2.4 % (ref 0.0–5.0)
HEMATOCRIT: 35.6 % — AB (ref 36.0–46.0)
Hemoglobin: 12 g/dL (ref 12.0–15.0)
LYMPHS ABS: 1.2 10*3/uL (ref 0.7–4.0)
LYMPHS PCT: 15.7 % (ref 12.0–46.0)
MCHC: 33.6 g/dL (ref 30.0–36.0)
MCV: 85 fl (ref 78.0–100.0)
MONO ABS: 0.9 10*3/uL (ref 0.1–1.0)
MONOS PCT: 11 % (ref 3.0–12.0)
NEUTROS ABS: 5.6 10*3/uL (ref 1.4–7.7)
Neutrophils Relative %: 70.4 % (ref 43.0–77.0)
PLATELETS: 356 10*3/uL (ref 150.0–400.0)
RBC: 4.18 Mil/uL (ref 3.87–5.11)
RDW: 16.5 % — ABNORMAL HIGH (ref 11.5–15.5)
WBC: 7.9 10*3/uL (ref 4.0–10.5)

## 2015-03-24 LAB — BASIC METABOLIC PANEL
BUN: 15 mg/dL (ref 6–23)
CO2: 24 mEq/L (ref 19–32)
Calcium: 9.4 mg/dL (ref 8.4–10.5)
Chloride: 107 mEq/L (ref 96–112)
Creatinine, Ser: 0.78 mg/dL (ref 0.40–1.20)
GFR: 77.25 mL/min (ref >60.00)
GLUCOSE: 128 mg/dL — AB (ref 70–99)
POTASSIUM: 4.4 meq/L (ref 3.5–5.1)
Sodium: 139 mEq/L (ref 135–145)

## 2015-03-24 LAB — LIPID PANEL
Cholesterol: 134 mg/dL (ref 0–200)
HDL: 43.8 mg/dL (ref >39.00)
LDL Cholesterol: 75 mg/dL (ref 0–99)
NonHDL: 90.2
Total CHOL/HDL Ratio: 3
Triglycerides: 76 mg/dL (ref 0.0–149.0)
VLDL: 15.2 mg/dL (ref 0.0–40.0)

## 2015-03-24 LAB — HEMOGLOBIN A1C: Hgb A1c MFr Bld: 6.5 % (ref 4.6–6.5)

## 2015-03-24 LAB — HEPATIC FUNCTION PANEL
ALK PHOS: 67 U/L (ref 39–117)
ALT: 14 U/L (ref 0–35)
AST: 13 U/L (ref 0–37)
Albumin: 3.8 g/dL (ref 3.5–5.2)
Bilirubin, Direct: 0.1 mg/dL (ref 0.0–0.3)
Total Bilirubin: 0.4 mg/dL (ref 0.2–1.2)
Total Protein: 7 g/dL (ref 6.0–8.3)

## 2015-03-24 LAB — TSH: TSH: 0.35 u[IU]/mL (ref 0.35–4.50)

## 2015-03-24 MED ORDER — GLIMEPIRIDE 4 MG PO TABS: 4.0000 mg | ORAL_TABLET | Freq: Every day | ORAL | Status: DC

## 2015-03-24 NOTE — Assessment & Plan Note (Signed)
Chronic problem.  Pt is tolerating Metformin and Amaryl w/o difficulty.  Denies symptomatic lows.  Due for eye exam but unable to afford at this time.  Will refer to ophtho in hopes that by linking it to pt's diabetes, it will be covered by insurance.  Foot exam done today.  On ACE for renal protection.  Will get labs today since pt did not have them drawn at time of CPE.  Adjust tx prn.

## 2015-03-24 NOTE — Progress Notes (Signed)
   Subjective:    Patient ID: Darlene Harrell, female    DOB: 06-Dec-1940, 74 y.o.   MRN: [id number]  HPI DM- chronic problem.  Pt is on Metformin and Amaryl.  On ACE for renal protection.  Pt never had her CPE labs drawn at last visit.  Due for eye exam but pt states this is too expensive and 'will have to wait'.  Pt not checking home CBGs.  No CP, SOB, HAs, visual changes, edema, abd pain, N/V, weakness/numbness of hands/feet.  Denies symptomatic lows.   Review of Systems For ROS see HPI     Objective:   Physical Exam  Constitutional: She is oriented to person, place, and time. She appears well-developed and well-nourished. No distress.  HENT:  Head: Normocephalic and atraumatic.  Eyes: Conjunctivae and EOM are normal. Pupils are equal, round, and reactive to light.  Neck: Normal range of motion. Neck supple. No thyromegaly present.  Cardiovascular: Normal rate, regular rhythm, normal heart sounds and intact distal pulses.   No murmur heard. Pulmonary/Chest: Effort normal and breath sounds normal. No respiratory distress.  Abdominal: Soft. She exhibits no distension. There is no tenderness.  Musculoskeletal: She exhibits no edema.  Lymphadenopathy:    She has no cervical adenopathy.  Neurological: She is alert and oriented to person, place, and time.  Skin: Skin is warm and dry.  Psychiatric: She has a normal mood and affect. Her behavior is normal.  Vitals reviewed.         Assessment & Plan:

## 2015-03-24 NOTE — Progress Notes (Signed)
Pre visit review using our clinic review tool, if applicable. No additional management support is needed unless otherwise documented below in the visit note. 

## 2015-03-24 NOTE — Patient Instructions (Signed)
Follow up in 3-4 months (or when you return) to recheck diabetes We'll notify you of your lab results and make any changes if needed Please call and check on your eye exam coverage due to diabetes Keep up the good work!  You look great! Call with any questions or concerns [name]!!!

## 2015-03-25 ENCOUNTER — Encounter: Payer: Self-pay | Admitting: Internal Medicine

## 2015-03-25 ENCOUNTER — Ambulatory Visit (INDEPENDENT_AMBULATORY_CARE_PROVIDER_SITE_OTHER): Payer: Medicare Other | Admitting: Internal Medicine

## 2015-03-25 VITALS — BP 112/76 | HR 67 | Ht 64.0 in | Wt 191.4 lb

## 2015-03-25 DIAGNOSIS — I4819 Other persistent atrial fibrillation: Secondary | ICD-10-CM

## 2015-03-25 DIAGNOSIS — I429 Cardiomyopathy, unspecified: Secondary | ICD-10-CM

## 2015-03-25 DIAGNOSIS — I481 Persistent atrial fibrillation: Secondary | ICD-10-CM | POA: Diagnosis not present

## 2015-03-25 DIAGNOSIS — I428 Other cardiomyopathies: Secondary | ICD-10-CM

## 2015-03-25 NOTE — Patient Instructions (Addendum)
Medication Instructions:  Your physician recommends that you continue on your current medications as directed. Please refer to the Current Medication list given to you today.   Labwork: None ordered  Testing/Procedures: None ordered  Follow-Up: Your physician recommends that you schedule a follow-up appointment in: 3 months with Roderic Palau, NP   Any Other Special Instructions Will Be Listed Below (If Applicable).

## 2015-03-26 NOTE — Progress Notes (Signed)
Electrophysiology Office Note   Date:  03/26/2015   ID:  Darlene Harrell, DOB Dec 30, 1940, MRN [id number]  PCP:  Annye Asa, MD  Cardiologist:  Dr Johnsie Cancel Primary Electrophysiologist:  Thompson Grayer, MD    Chief Complaint  Patient presents with  . Persistent AFIB     History of Present Illness: Darlene Harrell is a 74 y.o. female who presents today for electrophysiology evaluation.  Doing very well with tikosyn.  SOB and fatigue are resolved.  Exercise tolerance is improved.  She is thinking about moving back to [place] to assist her daughter with grandchildren.  IF so, she will need establish care there. She reports having a sleep study 12/15 in [place] and she did not have sleep apnea.  Today, she denies symptoms of palpitations, chest pain, orthopnea, PND, lower extremity edema, claudication, dizziness, presyncope, syncope, bleeding, or neurologic sequela. The patient is tolerating medications without difficulties and is otherwise without complaint today.    Past Medical History  Diagnosis Date  . Hypertension   . Depression   . Syncopal episodes   . High cholesterol   . Blood in stool     a. due to hemorrhoids.  . Persistent atrial fibrillation     a. In setting of Pneumonia 6/13 - Echocardiogram 03/30/12: Mild LVH, normal LV function, EF 60-65%, no wall motion abnormalities, mild MR, mild LAE, PASP 32.  . LV dysfunction 09/09/2014    EF40%  . Overweight   . Chronic systolic CHF (congestive heart failure)     "when I had pneumonia"  . CAP (community acquired pneumonia) 5/20123  . Type II diabetes mellitus   . Breast cancer, right breast 2010    a. s/p mastectomy/chemo/XRT to R breast, Dr. Marin Olp.  Marland Kitchen Anxiety    Past Surgical History  Procedure Laterality Date  . Tonsillectomy    . Tee without cardioversion N/A 09/09/2014    Procedure: TRANSESOPHAGEAL ECHOCARDIOGRAM (TEE);  Surgeon: Josue Hector, MD;  Location: [facility] ENDOSCOPY;  Service: Cardiovascular;   Laterality: N/A;  . Cardioversion N/A 09/09/2014    Procedure: CARDIOVERSION;  Surgeon: Josue Hector, MD;  Location: [facility] ENDOSCOPY;  Service: Cardiovascular;  Laterality: N/A;  . Breast biopsy Right 2010   . Mastectomy Right 2011     Current Outpatient Prescriptions  Medication Sig Dispense Refill  . apixaban (ELIQUIS) 5 MG TABS tablet Take 1 tablet (5 mg total) by mouth 2 (two) times daily. 180 tablet 3  . atorvastatin (LIPITOR) 20 MG tablet Take 1 tablet (20 mg total) by mouth daily. 90 tablet 3  . Blood Glucose Calibration (ONETOUCH VERIO) SOLN 1 application by In Vitro route as directed. 1 each 6  . carvedilol (COREG) 25 MG tablet Take 1 tablet (25 mg total) by mouth 2 (two) times daily with a meal. 60 tablet 11  . Cholecalciferol (VITAMIN D) 2000 UNITS tablet Take 2,000 Units by mouth daily.    . clonazePAM (KLONOPIN) 0.5 MG tablet Take 1 tablet (0.5 mg total) by mouth at bedtime as needed (sleep). 90 tablet 0  . dofetilide (TIKOSYN) 250 MCG capsule Take 1 capsule (250 mcg total) by mouth 2 (two) times daily. 180 capsule 1  . furosemide (LASIX) 40 MG tablet Take 1 tablet (40 mg total) by mouth daily. 30 tablet 6  . glimepiride (AMARYL) 4 MG tablet Take 1 tablet (4 mg total) by mouth daily before breakfast. 90 tablet 1  . glucose blood (ONETOUCH VERIO) test strip Test 1-2x daily due to labile blood sugars  Dx:E11.9 100 each 12  . letrozole (FEMARA) 2.5 MG tablet Take 1 tablet (2.5 mg total) by mouth daily. 180 tablet 0  . lisinopril (PRINIVIL,ZESTRIL) 10 MG tablet Take 1 tablet (10 mg total) by mouth daily. 90 tablet 3  . MAGNESIUM-OXIDE 400 (241.3 MG) MG tablet TAKE 1 TABLET BY MOUTH TWICE DAILY 60 tablet 3  . metFORMIN (GLUCOPHAGE) 1000 MG tablet TAKE ONE TABLET BY MOUTH TWICE DAILY WITH  A  MEAL 180 tablet 1  . nicotine polacrilex (NICORETTE) 4 MG gum Take 4 mg by mouth daily as needed for smoking cessation.     [name] DELICA LANCETS 85I MISC Use as directed to test sugars 100  each 3  . potassium chloride 20 MEQ TBCR Take 20 mEq by mouth daily. 30 tablet 3   No current facility-administered medications for this visit.    Allergies:   Review of patient's allergies indicates no known allergies.   Social History:  The patient  reports that she quit smoking about 5 months ago. Her smoking use included Cigarettes. She started smoking about 54 years ago. She has a 48 pack-year smoking history. She has never used smokeless tobacco. She reports that she drinks alcohol. She reports that she does not use illicit drugs.   Family History:  The patient's family history includes Alcohol abuse in her father; Diabetes in her father, mother, and son; Early death in her son; Heart disease in her mother; Hyperlipidemia in her son; Hypertension in her father and mother; Kidney failure in her son; Mental retardation in her mother; Stroke in her mother.    ROS:  Please see the history of present illness.   All other systems are reviewed and negative.    PHYSICAL EXAM: VS:  BP 112/76 mmHg  Pulse 67  Ht 5\' 4"  (1.626 m)  Wt 86.818 kg (191 lb 6.4 oz)  BMI 32.84 kg/m2 , BMI Body mass index is 32.84 kg/(m^2). GEN: overweight, well developed, in no acute distress HEENT: normal Neck: no JVD, carotid bruits, or masses Cardiac: RRR; no murmurs, rubs, or gallops,no edema  Respiratory:  clear to auscultation bilaterally, normal work of breathing GI: soft, nontender, nondistended, + BS MS: no deformity or atrophy Skin: warm and dry  Neuro:  Strength and sensation are intact Psych: euthymic mood, full affect  EKG:  EKG is ordered today. The ekg ordered today shows sinus rhythm with stable QT    Recent Labs: 02/04/2015: Magnesium 2.2 03/24/2015: ALT 14; BUN 15; Creatinine 0.78; Hemoglobin 12.0; Platelets 356.0; Potassium 4.4; Sodium 139; TSH 0.35    Lipid Panel     Component Value Date/Time   CHOL 134 03/24/2015 0905   TRIG 76.0 03/24/2015 0905   HDL 43.80 03/24/2015 0905    CHOLHDL 3 03/24/2015 0905   VLDL 15.2 03/24/2015 0905   LDLCALC 75 03/24/2015 0905     Wt Readings from Last 3 Encounters:  03/25/15 86.818 kg (191 lb 6.4 oz)  03/24/15 86.297 kg (190 lb 4 oz)  02/13/15 87.091 kg (192 lb)      Other studies Reviewed: AF clinic notes are reviewed   ASSESSMENT AND PLAN:  1.  Persistent atrial fibrillation Maintaining sinus with tikosyn No changes at this time Continue anticoagulation If she does not move, she should follow-up with Butch Penny in the AF clinic in 3 months I think that she could follow with Butch Penny every 3 months and I could see as needed going forward.  2. Nonischemic CM/ CHF Likely due to afib  as above Hopefully LV function will improve with sinus Repeat echo in 3 months  3. HTN Stable No change required today  Current medicines are reviewed at length with the patient today.   The patient does not have concerns regarding her medicines.  The following changes were made today:  none    Disposition:   FU with Roderic Palau NP in 3 months  Signed, Thompson Grayer, MD  03/26/2015 10:49 PM     [place] [address] [phone] (office) [phone] (fax)

## 2015-05-04 ENCOUNTER — Other Ambulatory Visit (HOSPITAL_COMMUNITY): Payer: Self-pay | Admitting: Nurse Practitioner

## 2015-05-18 ENCOUNTER — Other Ambulatory Visit: Payer: Self-pay | Admitting: Family Medicine

## 2015-05-19 NOTE — Telephone Encounter (Signed)
Med filled and faxed.  

## 2015-05-19 NOTE — Telephone Encounter (Signed)
Last OV 03/24/15 Clonazepam last filled 02-16-15 #90 with 0  CSC signed, low risk

## 2015-05-23 ENCOUNTER — Other Ambulatory Visit: Payer: Self-pay | Admitting: Cardiology

## 2015-05-23 ENCOUNTER — Other Ambulatory Visit: Payer: Self-pay | Admitting: Hematology & Oncology

## 2015-05-26 ENCOUNTER — Other Ambulatory Visit: Payer: Self-pay

## 2015-06-03 ENCOUNTER — Telehealth: Payer: Self-pay | Admitting: *Deleted

## 2015-06-03 NOTE — Telephone Encounter (Signed)
Received a call from patient advocate requesting that we call in an order to [facility] for the patients tikosyn. She provided [phone] as the number to call. I called the number and was unable to process the order as it was an automated system and I needed the patient identifier. I called and spoke with a [facility] representative and was provided the patient id number of [id number]. I then ordered the medication and was given an order number of [id number] and informed that it should arrive in 7-10 days.

## 2015-06-11 ENCOUNTER — Telehealth: Payer: Self-pay

## 2015-06-11 NOTE — Telephone Encounter (Signed)
Called patient to tell her that her Darlene Harrell has come in to be picked up,  06/11/15

## 2015-06-18 ENCOUNTER — Other Ambulatory Visit: Payer: Self-pay | Admitting: Internal Medicine

## 2015-06-25 ENCOUNTER — Inpatient Hospital Stay (HOSPITAL_COMMUNITY): Admission: RE | Admit: 2015-06-25 | Payer: Medicare Other | Source: Ambulatory Visit | Admitting: Nurse Practitioner

## 2015-07-10 ENCOUNTER — Other Ambulatory Visit: Payer: Medicare Other

## 2015-07-10 ENCOUNTER — Ambulatory Visit: Payer: Medicare Other | Admitting: Hematology & Oncology

## 2015-07-16 ENCOUNTER — Other Ambulatory Visit (INDEPENDENT_AMBULATORY_CARE_PROVIDER_SITE_OTHER): Payer: Medicare Other

## 2015-07-16 ENCOUNTER — Ambulatory Visit (HOSPITAL_COMMUNITY)
Admission: RE | Admit: 2015-07-16 | Discharge: 2015-07-16 | Disposition: A | Discharge status: Home / Self Care | Payer: Medicare Other | Source: Ambulatory Visit | Attending: Nurse Practitioner | Admitting: Nurse Practitioner

## 2015-07-16 VITALS — BP 138/70 | HR 67 | Ht 64.0 in | Wt 194.2 lb

## 2015-07-16 DIAGNOSIS — I10 Essential (primary) hypertension: Secondary | ICD-10-CM | POA: Diagnosis not present

## 2015-07-16 DIAGNOSIS — I481 Persistent atrial fibrillation: Secondary | ICD-10-CM

## 2015-07-16 DIAGNOSIS — I4819 Other persistent atrial fibrillation: Secondary | ICD-10-CM

## 2015-07-16 LAB — BASIC METABOLIC PANEL
BUN: 22 mg/dL (ref 6–23)
CHLORIDE: 101 meq/L (ref 96–112)
CO2: 30 mEq/L (ref 19–32)
CREATININE: 0.95 mg/dL (ref 0.40–1.20)
Calcium: 9.7 mg/dL (ref 8.4–10.5)
GFR: 61.47 mL/min (ref >60.00)
Glucose, Bld: 146 mg/dL — ABNORMAL HIGH (ref 70–99)
POTASSIUM: 4.3 meq/L (ref 3.5–5.1)
SODIUM: 138 meq/L (ref 135–145)

## 2015-07-16 LAB — MAGNESIUM: MAGNESIUM: 2.1 mg/dL (ref 1.5–2.5)

## 2015-07-16 NOTE — Progress Notes (Signed)
Patient ID: Darlene Harrell, female   DOB: 1940-12-31, 74 y.o.   MRN: [id number]     Primary Care Physician: Darlene Asa, MD Referring Physician:Dr. Rayann Heman   Darlene Harrell is a 74 y.o. female with a h/o persistent afib that is in the afib cliinc for f/u tikosyn therapy. She reports that she is maintaining SR. She spent the summer in [place] taking care of her grandson.She had no health concerns while there. She will need f/u echo to see if improvement in EF maintaining SR. Labs to be drawn today.Coninues on eliquis without bleeding concerns.  Today, she denies symptoms of palpitations, chest pain, shortness of breath, orthopnea, PND, lower extremity edema, dizziness, presyncope, syncope, or neurologic sequela. The patient is tolerating medications without difficulties and is otherwise without complaint today.   Past Medical History  Diagnosis Date  . Hypertension   . Depression   . Syncopal episodes   . High cholesterol   . Blood in stool     a. due to hemorrhoids.  . Persistent atrial fibrillation     a. In setting of Pneumonia 6/13 - Echocardiogram 03/30/12: Mild LVH, normal LV function, EF 60-65%, no wall motion abnormalities, mild MR, mild LAE, PASP 32.  . LV dysfunction 09/09/2014    EF40%  . Overweight   . Chronic systolic CHF (congestive heart failure)     "when I had pneumonia"  . CAP (community acquired pneumonia) 5/20123  . Type II diabetes mellitus   . Breast cancer, right breast 2010    a. s/p mastectomy/chemo/XRT to R breast, Dr. Marin Olp.  Marland Kitchen Anxiety    Past Surgical History  Procedure Laterality Date  . Tonsillectomy    . Tee without cardioversion N/A 09/09/2014    Procedure: TRANSESOPHAGEAL ECHOCARDIOGRAM (TEE);  Surgeon: Josue Hector, MD;  Location: [facility] ENDOSCOPY;  Service: Cardiovascular;  Laterality: N/A;  . Cardioversion N/A 09/09/2014    Procedure: CARDIOVERSION;  Surgeon: Josue Hector, MD;  Location: [facility] ENDOSCOPY;  Service: Cardiovascular;  Laterality:  N/A;  . Breast biopsy Right 2010   . Mastectomy Right 2011    Current Outpatient Prescriptions  Medication Sig Dispense Refill  . atorvastatin (LIPITOR) 20 MG tablet Take 1 tablet (20 mg total) by mouth daily. 90 tablet 3  . Blood Glucose Calibration (ONETOUCH VERIO) SOLN 1 application by In Vitro route as directed. 1 each 6  . carvedilol (COREG) 25 MG tablet Take 1 tablet (25 mg total) by mouth 2 (two) times daily with a meal. 60 tablet 11  . Cholecalciferol (VITAMIN D) 2000 UNITS tablet Take 2,000 Units by mouth daily.    . clonazePAM (KLONOPIN) 0.5 MG tablet TAKE 1 TABLET BY MOUTH AT BEDTIME AS NEEDED FOR SLEEP 90 tablet 0  . dofetilide (TIKOSYN) 250 MCG capsule Take 1 capsule (250 mcg total) by mouth 2 (two) times daily. 180 capsule 1  . ELIQUIS 5 MG TABS tablet TAKE ONE TABLET BY MOUTH TWICE DAILY 60 tablet 5  . furosemide (LASIX) 40 MG tablet Take 1 tablet (40 mg total) by mouth daily. 30 tablet 6  . glimepiride (AMARYL) 4 MG tablet Take 1 tablet (4 mg total) by mouth daily before breakfast. 90 tablet 1  . glucose blood (ONETOUCH VERIO) test strip Test 1-2x daily due to labile blood sugars Dx:E11.9 100 each 12  . letrozole (FEMARA) 2.5 MG tablet TAKE 1 TABLET BY MOUTH DAILY 180 tablet 0  . lisinopril (PRINIVIL,ZESTRIL) 10 MG tablet Take 1 tablet (10 mg total) by mouth daily. [place]  tablet 3  . MAGNESIUM-OXIDE 400 (241.3 MG) MG tablet TAKE 1 TABLET BY MOUTH TWICE DAILY 60 tablet 0  . metFORMIN (GLUCOPHAGE) 1000 MG tablet TAKE ONE TABLET BY MOUTH TWICE DAILY WITH  A  MEAL 180 tablet 1  . nicotine polacrilex (NICORETTE) 4 MG gum Take 4 mg by mouth daily as needed for smoking cessation.     [name] DELICA LANCETS 63W MISC Use as directed to test sugars 100 each 3  . potassium chloride SA (K-DUR,KLOR-CON) 20 MEQ tablet TAKE 1 TABLET BY MOUTH DAILY 90 tablet 6   No current facility-administered medications for this encounter.    No Known Allergies  Social History   Social History  .  Marital Status: Divorced    Spouse Name: N/A  . Number of Children: N/A  . Years of Education: N/A   Occupational History  . Not on file.   Social History Main Topics  . Smoking status: Former Smoker -- 1.00 packs/day for 48 years    Types: Cigarettes    Start date: 09/03/1960    Quit date: 10/11/2014  . Smokeless tobacco: Never Used     Comment: 01/19/2015  She presently chews nicorette gum.  Marland Kitchen Alcohol Use: 0.0 oz/week    0 Standard drinks or equivalent per week     Comment: 01/19/2015 "I'll have a couple shots in my coffee/year"  . Drug Use: No  . Sexual Activity: No   Other Topics Concern  . Not on file   Social History Narrative   Lives in [place] but spends some time in [place] visiting family (2-3 months per year)    Family History  Problem Relation Age of Onset  . Stroke Mother   . Hypertension Mother   . Mental retardation Mother   . Diabetes Mother   . Alcohol abuse Father   . Hypertension Father   . Diabetes Father   . Diabetes Son   . Kidney failure Son   . Early death Son   . Hyperlipidemia Son   . Heart disease Mother     ROS- All systems are reviewed and negative except as per the HPI above  Physical Exam: Filed Vitals:   07/16/15 1107  BP: 138/70  Pulse: 67  Height: 5\' 4"  (1.626 m)  Weight: 194 lb 3.2 oz (88.089 kg)    GEN- The patient is well appearing, alert and oriented x 3 today.   Head- normocephalic, atraumatic Eyes-  Sclera clear, conjunctiva pink Ears- hearing intact Oropharynx- clear Neck- supple, no JVP Lymph- no cervical lymphadenopathy Lungs- Clear to ausculation bilaterally, normal work of breathing Heart- Regular rate and rhythm, no murmurs, rubs or gallops, PMI not laterally displaced GI- soft, NT, ND, + BS Extremities- no clubbing, cyanosis, or edema MS- no significant deformity or atrophy Skin- no rash or lesion Psych- euthymic mood, full affect Neuro- strength and sensation are intact  EKG-NSR with NS ST/T wave  abnormality, HR 67 bpm, QRS 76 bpm, QTc 441 ms. Dr. Jackalyn Lombard note reviewed.  Assessment and Plan: 1. Persistent afib Maintaining SR with tikosyn Continue eliquis Bmet/mag today  2. LV dysfunction of 40% Repeat echo for improvement in SR  3. HTN Stable  F/u in 3 months in afib clinic  Butch Penny C. Sandrina Heaton, [facility] [address] [place], [place] [address] [phone]

## 2015-07-16 NOTE — Addendum Note (Signed)
Addended by: Stephannie Peters on: 07/16/2015 11:48 AM   Modules accepted: Orders

## 2015-07-16 NOTE — Patient Instructions (Signed)
Code for December 0009

## 2015-07-21 ENCOUNTER — Telehealth: Payer: Self-pay | Admitting: *Deleted

## 2015-07-21 MED ORDER — MAGNESIUM OXIDE 400 (241.3 MG) MG PO TABS: 1.0000 | ORAL_TABLET | Freq: Two times a day (BID) | ORAL | Status: DC

## 2015-07-21 NOTE — Telephone Encounter (Signed)
Patient called and wanted to know if based on her labs from last week, should she continue the magnesium? Please advise as she needs this refilled but wanted to be sure that she should continue. Thanks, MI

## 2015-07-21 NOTE — Telephone Encounter (Signed)
Notified patient to remain on current dose of magnesium. Patient verbalized understanding and prescription sent to pharmacy.

## 2015-08-04 ENCOUNTER — Ambulatory Visit (HOSPITAL_COMMUNITY)
Admission: RE | Admit: 2015-08-04 | Discharge: 2015-08-04 | Disposition: A | Discharge status: Home / Self Care | Payer: Medicare Other | Source: Ambulatory Visit | Attending: Nurse Practitioner | Admitting: Nurse Practitioner

## 2015-08-04 DIAGNOSIS — I340 Nonrheumatic mitral (valve) insufficiency: Secondary | ICD-10-CM | POA: Insufficient documentation

## 2015-08-04 DIAGNOSIS — I517 Cardiomegaly: Secondary | ICD-10-CM | POA: Insufficient documentation

## 2015-08-04 DIAGNOSIS — E119 Type 2 diabetes mellitus without complications: Secondary | ICD-10-CM | POA: Insufficient documentation

## 2015-08-04 DIAGNOSIS — I481 Persistent atrial fibrillation: Secondary | ICD-10-CM | POA: Diagnosis not present

## 2015-08-04 DIAGNOSIS — I10 Essential (primary) hypertension: Secondary | ICD-10-CM | POA: Diagnosis not present

## 2015-08-04 DIAGNOSIS — R0600 Dyspnea, unspecified: Secondary | ICD-10-CM | POA: Diagnosis not present

## 2015-08-04 DIAGNOSIS — I4819 Other persistent atrial fibrillation: Secondary | ICD-10-CM

## 2015-08-04 DIAGNOSIS — E785 Hyperlipidemia, unspecified: Secondary | ICD-10-CM | POA: Diagnosis not present

## 2015-08-04 NOTE — Progress Notes (Signed)
  Echocardiogram 2D Echocardiogram has been performed.  Darlene Harrell 08/04/2015, 12:02 PM

## 2015-08-14 ENCOUNTER — Encounter: Payer: Self-pay | Admitting: Family Medicine

## 2015-08-14 ENCOUNTER — Ambulatory Visit (INDEPENDENT_AMBULATORY_CARE_PROVIDER_SITE_OTHER): Payer: Medicare Other | Admitting: Family Medicine

## 2015-08-14 ENCOUNTER — Encounter: Payer: Self-pay | Admitting: General Practice

## 2015-08-14 VITALS — BP 130/72 | HR 86 | Temp 97.9°F | Resp 16 | Ht 64.0 in | Wt 196.1 lb

## 2015-08-14 DIAGNOSIS — Z23 Encounter for immunization: Secondary | ICD-10-CM | POA: Diagnosis not present

## 2015-08-14 DIAGNOSIS — E119 Type 2 diabetes mellitus without complications: Secondary | ICD-10-CM

## 2015-08-14 LAB — HEMOGLOBIN A1C: HEMOGLOBIN A1C: 6.8 % — AB (ref 4.6–6.5)

## 2015-08-14 LAB — LIPID PANEL
CHOL/HDL RATIO: 3
CHOLESTEROL: 146 mg/dL (ref 0–200)
HDL: 44.2 mg/dL (ref >39.00)
LDL CALC: 79 mg/dL (ref 0–99)
NonHDL: 101.54
Triglycerides: 112 mg/dL (ref 0.0–149.0)
VLDL: 22.4 mg/dL (ref 0.0–40.0)

## 2015-08-14 LAB — HEPATIC FUNCTION PANEL
ALT: 14 U/L (ref 0–35)
AST: 13 U/L (ref 0–37)
Albumin: 4 g/dL (ref 3.5–5.2)
Alkaline Phosphatase: 94 U/L (ref 39–117)
BILIRUBIN DIRECT: 0.1 mg/dL (ref 0.0–0.3)
BILIRUBIN TOTAL: 0.5 mg/dL (ref 0.2–1.2)
Total Protein: 7.4 g/dL (ref 6.0–8.3)

## 2015-08-14 LAB — CBC WITH DIFFERENTIAL/PLATELET
BASOS PCT: 0.7 % (ref 0.0–3.0)
Basophils Absolute: 0.1 10*3/uL (ref 0.0–0.1)
EOS PCT: 3 % (ref 0.0–5.0)
Eosinophils Absolute: 0.3 10*3/uL (ref 0.0–0.7)
HEMATOCRIT: 37.3 % (ref 36.0–46.0)
HEMOGLOBIN: 12.4 g/dL (ref 12.0–15.0)
Lymphocytes Relative: 13.9 % (ref 12.0–46.0)
Lymphs Abs: 1.2 10*3/uL (ref 0.7–4.0)
MCHC: 33.2 g/dL (ref 30.0–36.0)
MCV: 87.3 fl (ref 78.0–100.0)
MONO ABS: 0.9 10*3/uL (ref 0.1–1.0)
Monocytes Relative: 10.2 % (ref 3.0–12.0)
Neutro Abs: 6.1 10*3/uL (ref 1.4–7.7)
Neutrophils Relative %: 72.2 % (ref 43.0–77.0)
PLATELETS: 385 10*3/uL (ref 150.0–400.0)
RBC: 4.27 Mil/uL (ref 3.87–5.11)
RDW: 14.5 % (ref 11.5–15.5)
WBC: 8.4 10*3/uL (ref 4.0–10.5)

## 2015-08-14 LAB — BASIC METABOLIC PANEL
BUN: 19 mg/dL (ref 6–23)
CO2: 26 mEq/L (ref 19–32)
Calcium: 9.5 mg/dL (ref 8.4–10.5)
Chloride: 103 mEq/L (ref 96–112)
Creatinine, Ser: 0.92 mg/dL (ref 0.40–1.20)
GFR: 63.78 mL/min (ref >60.00)
Glucose, Bld: 130 mg/dL — ABNORMAL HIGH (ref 70–99)
POTASSIUM: 3.8 meq/L (ref 3.5–5.1)
SODIUM: 139 meq/L (ref 135–145)

## 2015-08-14 MED ORDER — METFORMIN HCL 1000 MG PO TABS: ORAL_TABLET | ORAL | Status: DC

## 2015-08-14 NOTE — Progress Notes (Signed)
Pre visit review using our clinic review tool, if applicable. No additional management support is needed unless otherwise documented below in the visit note. 

## 2015-08-14 NOTE — Progress Notes (Signed)
   Subjective:    Patient ID: Darlene Harrell, female    DOB: 1941/06/07, 74 y.o.   MRN: [id number]  HPI DM- chronic problem, on Amaryl and Metformin.  UTD on foot exam.  Due for eye exam (cannot afford at this time).  Pt spent the summer in [place] watching her grandchild- she wants to move back.  On ACE for renal protection.  Not checking sugars.  Denies symptomatic lows.  No CP, SOB, HAs, visual changes, numbness/tingling of hands/feet.   Review of Systems For ROS see HPI     Objective:   Physical Exam  Constitutional: She is oriented to person, place, and time. She appears well-developed and well-nourished. No distress.  obese  HENT:  Head: Normocephalic and atraumatic.  Eyes: Conjunctivae and EOM are normal. Pupils are equal, round, and reactive to light.  Neck: Normal range of motion. Neck supple. No thyromegaly present.  Cardiovascular: Normal rate, regular rhythm, normal heart sounds and intact distal pulses.   No murmur heard. Pulmonary/Chest: Effort normal and breath sounds normal. No respiratory distress.  Abdominal: Soft. She exhibits no distension. There is no tenderness.  Musculoskeletal: She exhibits no edema.  Lymphadenopathy:    She has no cervical adenopathy.  Neurological: She is alert and oriented to person, place, and time.  Skin: Skin is warm and dry.  Psychiatric: She has a normal mood and affect. Her behavior is normal.  Vitals reviewed.         Assessment & Plan:

## 2015-08-14 NOTE — Assessment & Plan Note (Signed)
Chronic problem for pt.  Typically well controlled.  Not checking sugars.  Asymptomatic at this time.  On ACE for renal protection.  UTD on foot exam.  Due for eye exam- pt plans to schedule.  Stressed need for healthy, low carb diet and regular exercise.  Check labs.  Adjust meds prn

## 2015-08-14 NOTE — Patient Instructions (Signed)
Follow up in 3-4 months to recheck diabetes We'll notify you of your lab results and make any changes if needed Continue to work on healthy diet and regular exercise- you can do it! Call and schedule your eye exam at your convenience Call with any questions or concerns If you want to join [place] at the new [place] office, any scheduled appointments will automatically transfer and we will see you at [address] [place] [address] [place], [place] [address]  Happy Early Rudene Anda!!!

## 2015-08-23 ENCOUNTER — Other Ambulatory Visit: Payer: Self-pay | Admitting: Family Medicine

## 2015-08-24 NOTE — Telephone Encounter (Signed)
Medication filled to pharmacy as requested.   

## 2015-08-24 NOTE — Telephone Encounter (Signed)
Last OV 08/14/15 Clonazepam last filled 05-19-15 #90 with 0  Low Risk,

## 2015-08-25 ENCOUNTER — Telehealth: Payer: Self-pay

## 2015-08-25 NOTE — Telephone Encounter (Signed)
Patient's Tikosyn 260mcg 3 bottles of 60 each arrived by mail. Patient notified.

## 2015-09-25 ENCOUNTER — Other Ambulatory Visit: Payer: Self-pay | Admitting: Cardiology

## 2015-09-26 ENCOUNTER — Other Ambulatory Visit: Payer: Self-pay | Admitting: Family Medicine

## 2015-09-26 ENCOUNTER — Other Ambulatory Visit: Payer: Self-pay | Admitting: Physician Assistant

## 2015-09-28 NOTE — Telephone Encounter (Signed)
Medication filled to pharmacy as requested.   

## 2015-10-01 ENCOUNTER — Telehealth: Payer: Self-pay | Admitting: Family Medicine

## 2015-10-01 MED ORDER — ATORVASTATIN CALCIUM 20 MG PO TABS: ORAL_TABLET | ORAL | Status: DC

## 2015-10-01 NOTE — Telephone Encounter (Signed)
Medication filled to pharmacy as requested.   

## 2015-10-01 NOTE — Telephone Encounter (Signed)
Caller name: John  Relationship to patient: Son   Can be reached: [phone]  Pharmacy:  [id number] - [place], [address] [phone] (Phone) [phone] (Fax)         Reason for call: Requesting refill on atorvastatin (LIPITOR) 20 MG tablet [id number]

## 2015-10-12 ENCOUNTER — Inpatient Hospital Stay (HOSPITAL_COMMUNITY): Admission: RE | Admit: 2015-10-12 | Payer: Medicare Other | Source: Ambulatory Visit | Admitting: Nurse Practitioner

## 2015-10-23 ENCOUNTER — Telehealth: Payer: Self-pay | Admitting: Family Medicine

## 2015-10-23 MED ORDER — FUROSEMIDE 40 MG PO TABS: 40.0000 mg | ORAL_TABLET | Freq: Every day | ORAL | Status: DC

## 2015-10-23 NOTE — Telephone Encounter (Signed)
Caller name:Lisa  Relationship to patient:pharmacist Can be reached:[phone] Streeter cornwallis Reason for call:furosemide 40mg  take 1 daily

## 2015-10-23 NOTE — Telephone Encounter (Signed)
Medication filled to pharmacy as requested.   

## 2015-10-28 ENCOUNTER — Telehealth: Payer: Self-pay | Admitting: Internal Medicine

## 2015-10-28 ENCOUNTER — Ambulatory Visit (HOSPITAL_COMMUNITY)
Admission: RE | Admit: 2015-10-28 | Discharge: 2015-10-28 | Disposition: A | Discharge status: Home / Self Care | Payer: Medicare Other | Source: Ambulatory Visit | Attending: Nurse Practitioner | Admitting: Nurse Practitioner

## 2015-10-28 ENCOUNTER — Other Ambulatory Visit: Payer: Medicare Other

## 2015-10-28 ENCOUNTER — Encounter (HOSPITAL_COMMUNITY): Payer: Self-pay | Admitting: Nurse Practitioner

## 2015-10-28 ENCOUNTER — Other Ambulatory Visit (INDEPENDENT_AMBULATORY_CARE_PROVIDER_SITE_OTHER): Payer: Medicare Other | Admitting: *Deleted

## 2015-10-28 VITALS — BP 154/76 | HR 82 | Ht 64.0 in | Wt 193.4 lb

## 2015-10-28 DIAGNOSIS — I428 Other cardiomyopathies: Secondary | ICD-10-CM

## 2015-10-28 DIAGNOSIS — I481 Persistent atrial fibrillation: Secondary | ICD-10-CM | POA: Diagnosis not present

## 2015-10-28 DIAGNOSIS — I480 Paroxysmal atrial fibrillation: Secondary | ICD-10-CM | POA: Diagnosis not present

## 2015-10-28 DIAGNOSIS — I10 Essential (primary) hypertension: Secondary | ICD-10-CM

## 2015-10-28 DIAGNOSIS — I4819 Other persistent atrial fibrillation: Secondary | ICD-10-CM

## 2015-10-28 DIAGNOSIS — I4891 Unspecified atrial fibrillation: Secondary | ICD-10-CM | POA: Diagnosis not present

## 2015-10-28 DIAGNOSIS — I519 Heart disease, unspecified: Secondary | ICD-10-CM | POA: Diagnosis not present

## 2015-10-28 DIAGNOSIS — I429 Cardiomyopathy, unspecified: Secondary | ICD-10-CM | POA: Diagnosis not present

## 2015-10-28 DIAGNOSIS — E785 Hyperlipidemia, unspecified: Secondary | ICD-10-CM | POA: Diagnosis not present

## 2015-10-28 LAB — BASIC METABOLIC PANEL
BUN: 18 mg/dL (ref 7–25)
CO2: 23 mmol/L (ref 20–31)
Calcium: 9.5 mg/dL (ref 8.6–10.4)
Chloride: 100 mmol/L (ref 98–110)
Creat: 0.94 mg/dL — ABNORMAL HIGH (ref 0.60–0.93)
GLUCOSE: 282 mg/dL — AB (ref 65–99)
POTASSIUM: 4.5 mmol/L (ref 3.5–5.3)
SODIUM: 137 mmol/L (ref 135–146)

## 2015-10-28 LAB — MAGNESIUM: MAGNESIUM: 2 mg/dL (ref 1.5–2.5)

## 2015-10-28 NOTE — Addendum Note (Signed)
Addended by: Eulis Foster on: 10/28/2015 11:45 AM   Modules accepted: Orders

## 2015-10-28 NOTE — Telephone Encounter (Signed)
Walk in pt form-[employer] papers dropped off gave to [employer]

## 2015-10-28 NOTE — Addendum Note (Signed)
Addended by: Eulis Foster on: 10/28/2015 11:44 AM   Modules accepted: Orders

## 2015-10-28 NOTE — Progress Notes (Signed)
Patient ID: Darlene Harrell, female   DOB: [birth year], 74 y.o.   MRN: [id number]      Primary Care Physician: Annye Asa, MD Referring Physician:Dr. Rayann Heman   Darlene Harrell is a 74 y.o. female with a h/o persistent afib that is in the afib cliinc for f/u tikosyn therapy. She reports that she is maintaining SR. She has no complaints today. EKG shows SR stable QT. She had an echo done in SR and showed return of normal LV function with maintenance of SR.  Today, she denies symptoms of palpitations, chest pain, shortness of breath, orthopnea, PND, lower extremity edema, dizziness, presyncope, syncope, or neurologic sequela. The patient is tolerating medications without difficulties and is otherwise without complaint today.   Past Medical History  Diagnosis Date  . Hypertension   . Depression   . Syncopal episodes   . High cholesterol   . Blood in stool     a. due to hemorrhoids.  . Persistent atrial fibrillation ([place])     a. In setting of Pneumonia 6/13 - Echocardiogram 03/30/12: Mild LVH, normal LV function, EF 60-65%, no wall motion abnormalities, mild MR, mild LAE, PASP 32.  . LV dysfunction 09/09/2014    EF40%  . Overweight   . Chronic systolic CHF (congestive heart failure) ([place])     "when I had pneumonia"  . CAP (community acquired pneumonia) 5/20123  . Type II diabetes mellitus ([place])   . Breast cancer, right breast ([place]) 2010    a. s/p mastectomy/chemo/XRT to R breast, Dr. Marin Olp.  Marland Kitchen Anxiety    Past Surgical History  Procedure Laterality Date  . Tonsillectomy    . Tee without cardioversion N/A 09/09/2014    Procedure: TRANSESOPHAGEAL ECHOCARDIOGRAM (TEE);  Surgeon: Josue Hector, MD;  Location: [facility] ENDOSCOPY;  Service: Cardiovascular;  Laterality: N/A;  . Cardioversion N/A 09/09/2014    Procedure: CARDIOVERSION;  Surgeon: Josue Hector, MD;  Location: [facility] ENDOSCOPY;  Service: Cardiovascular;  Laterality: N/A;  . Breast biopsy Right 2010   . Mastectomy Right 2011     Current Outpatient Prescriptions  Medication Sig Dispense Refill  . atorvastatin (LIPITOR) 20 MG tablet TAKE 1 BY MOUTH EVERY DAY 90 tablet 1  . Blood Glucose Calibration (ONETOUCH VERIO) SOLN 1 application by In Vitro route as directed. 1 each 6  . carvedilol (COREG) 25 MG tablet Take 1 tablet (25 mg total) by mouth 2 (two) times daily with a meal. 60 tablet 11  . Cholecalciferol (VITAMIN D) 2000 UNITS tablet Take 2,000 Units by mouth daily.    . clonazePAM (KLONOPIN) 0.5 MG tablet TAKE 1 TABLET BY MOUTH EVERY NIGHT AT BEDTIME AS NEEDED FOR SLEEP 90 tablet 0  . dofetilide (TIKOSYN) 250 MCG capsule Take 1 capsule (250 mcg total) by mouth 2 (two) times daily. 180 capsule 1  . ELIQUIS 5 MG TABS tablet TAKE ONE TABLET BY MOUTH TWICE DAILY 60 tablet 5  . furosemide (LASIX) 40 MG tablet Take 1 tablet (40 mg total) by mouth daily. 30 tablet 6  . glimepiride (AMARYL) 4 MG tablet TAKE 1 TABLET(4 MG) BY MOUTH DAILY BEFORE BREAKFAST 90 tablet 1  . glucose blood (ONETOUCH VERIO) test strip Test 1-2x daily due to labile blood sugars Dx:E11.9 100 each 12  . letrozole (FEMARA) 2.5 MG tablet TAKE 1 TABLET BY MOUTH DAILY 180 tablet 0  . lisinopril (PRINIVIL,ZESTRIL) 10 MG tablet Take 1 tablet (10 mg total) by mouth daily. 90 tablet 3  . magnesium oxide (MAGNESIUM-OXIDE) 400 (241.3 MG)  MG tablet Take 1 tablet (400 mg total) by mouth 2 (two) times daily. 60 tablet 11  . metFORMIN (GLUCOPHAGE) 1000 MG tablet TAKE ONE TABLET BY MOUTH TWICE DAILY WITH  A  MEAL 180 tablet 1  . nicotine polacrilex (NICORETTE) 4 MG gum Take 4 mg by mouth daily as needed for smoking cessation.     [name] DELICA LANCETS [id number] MISC Use as directed to test sugars 100 each 3  . potassium chloride SA (K-DUR,KLOR-CON) 20 MEQ tablet TAKE 1 TABLET BY MOUTH DAILY 90 tablet 6   No current facility-administered medications for this encounter.    No Known Allergies  Social History   Social History  . Marital Status: Divorced     Spouse Name: N/A  . Number of Children: N/A  . Years of Education: N/A   Occupational History  . Not on file.   Social History Main Topics  . Smoking status: Former Smoker -- 1.00 packs/day for 48 years    Types: Cigarettes    Start date: 09/03/1960    Quit date: 10/11/2014  . Smokeless tobacco: Never Used     Comment: 01/19/2015  She presently chews nicorette gum.  Marland Kitchen Alcohol Use: 0.0 oz/week    0 Standard drinks or equivalent per week     Comment: 01/19/2015 "I'll have a couple shots in my coffee/year"  . Drug Use: No  . Sexual Activity: No   Other Topics Concern  . Not on file   Social History Narrative   Lives in [place] but spends some time in [place] visiting family (2-3 months per year)    Family History  Problem Relation Age of Onset  . Stroke Mother   . Hypertension Mother   . Mental retardation Mother   . Diabetes Mother   . Alcohol abuse Father   . Hypertension Father   . Diabetes Father   . Diabetes Son   . Kidney failure Son   . Early death Son   . Hyperlipidemia Son   . Heart disease Mother     ROS- All systems are reviewed and negative except as per the HPI above  Physical Exam: Filed Vitals:   10/28/15 1108  BP: 154/76  Pulse: 82  Height: 5\' 4"  (1.626 m)  Weight: 193 lb 6.4 oz (87.726 kg)    GEN- The patient is well appearing, alert and oriented x 3 today.   Head- normocephalic, atraumatic Eyes-  Sclera clear, conjunctiva pink Ears- hearing intact Oropharynx- clear Neck- supple, no JVP Lymph- no cervical lymphadenopathy Lungs- Clear to ausculation bilaterally, normal work of breathing Heart- Regular rate and rhythm, no murmurs, rubs or gallops, PMI not laterally displaced GI- soft, NT, ND, + BS Extremities- no clubbing, cyanosis, or edema MS- no significant deformity or atrophy Skin- no rash or lesion Psych- euthymic mood, full affect Neuro- strength and sensation are intact  EKG-NSR with HR of 82 bpm, QRS 82 bpm, QTc 457  ms. Epic records reviewed  Assessment and Plan: 1. Persistent afib Maintaining SR with tikosyn Continue eliquis Bmet/mag today  2. Prior LV dysfunction of 40% while in afib Repeat echo shows normal lv function with SR  3. HTN Elevated in clinic, pt states at home readings stable  F/u in 3 months in afib clinic  [place]. Akeisha Lagerquist, [facility] [address] [place], [place] [address] [phone]

## 2015-10-29 ENCOUNTER — Other Ambulatory Visit: Payer: Self-pay

## 2015-10-29 MED ORDER — APIXABAN 5 MG PO TABS: 5.0000 mg | ORAL_TABLET | Freq: Two times a day (BID) | ORAL | Status: DC

## 2015-10-29 MED ORDER — DOFETILIDE 250 MCG PO CAPS: 250.0000 ug | ORAL_CAPSULE | Freq: Two times a day (BID) | ORAL | Status: AC

## 2015-11-17 ENCOUNTER — Other Ambulatory Visit (HOSPITAL_COMMUNITY): Payer: Self-pay | Admitting: *Deleted

## 2015-11-17 MED ORDER — APIXABAN 5 MG PO TABS: 5.0000 mg | ORAL_TABLET | Freq: Two times a day (BID) | ORAL | Status: AC

## 2015-11-19 ENCOUNTER — Telehealth: Payer: Self-pay | Admitting: Physician Assistant

## 2015-11-20 NOTE — Telephone Encounter (Signed)
Will defer further refills of patient's medications to PCP  

## 2015-11-23 ENCOUNTER — Other Ambulatory Visit: Payer: Self-pay | Admitting: *Deleted

## 2015-11-23 MED ORDER — LETROZOLE 2.5 MG PO TABS: 2.5000 mg | ORAL_TABLET | Freq: Every day | ORAL | Status: AC

## 2015-11-24 ENCOUNTER — Other Ambulatory Visit: Payer: Self-pay | Admitting: Physician Assistant

## 2015-11-24 NOTE — Telephone Encounter (Signed)
Ok to resend prescription 

## 2015-11-24 NOTE — Telephone Encounter (Signed)
Would like for me to call in?

## 2015-11-24 NOTE — Telephone Encounter (Signed)
Pt called stating the pharmacy states they did not receive klonopin RX 11/20/15. Please resend.  [id number] - [place], [place]

## 2015-11-24 NOTE — Telephone Encounter (Signed)
Will defer further refills of patient's medications to PCP  

## 2015-11-25 NOTE — Telephone Encounter (Signed)
This medication was faxed to pharmacy today.

## 2015-12-14 ENCOUNTER — Telehealth: Payer: Self-pay

## 2015-12-14 NOTE — Telephone Encounter (Signed)
Patient walked in today. Apparently was given incorrect strength of Tikosyn samples. Had 566mcg capsules, which she has never taken. We do not know where she got these from. She is concerned because she is almost out of meds and needs to go out of town urgently. I spoke with 2 different [facility] reps. They informed me she is enrolled in the program till 10/30/2016. However, they stated they did not have a current Rx. I faxed this along with her application. I faxed it again to [facility], with instructions that she needed this urgently.

## 2015-12-16 ENCOUNTER — Other Ambulatory Visit: Payer: Self-pay | Admitting: Cardiovascular Disease

## 2015-12-17 ENCOUNTER — Encounter: Payer: Self-pay | Admitting: Family Medicine

## 2015-12-17 ENCOUNTER — Ambulatory Visit (INDEPENDENT_AMBULATORY_CARE_PROVIDER_SITE_OTHER): Payer: Medicare Other | Admitting: Family Medicine

## 2015-12-17 VITALS — BP 138/90 | HR 84 | Temp 98.2°F | Ht 64.0 in | Wt 186.8 lb

## 2015-12-17 DIAGNOSIS — F4323 Adjustment disorder with mixed anxiety and depressed mood: Secondary | ICD-10-CM

## 2015-12-17 DIAGNOSIS — E119 Type 2 diabetes mellitus without complications: Secondary | ICD-10-CM | POA: Diagnosis not present

## 2015-12-17 LAB — BASIC METABOLIC PANEL
BUN: 17 mg/dL (ref 6–23)
CALCIUM: 9.5 mg/dL (ref 8.4–10.5)
CO2: 26 meq/L (ref 19–32)
CREATININE: 0.88 mg/dL (ref 0.40–1.20)
Chloride: 103 mEq/L (ref 96–112)
GFR: 67.07 mL/min (ref >60.00)
Glucose, Bld: 145 mg/dL — ABNORMAL HIGH (ref 70–99)
Potassium: 4.3 mEq/L (ref 3.5–5.1)
SODIUM: 136 meq/L (ref 135–145)

## 2015-12-17 LAB — HEMOGLOBIN A1C: HEMOGLOBIN A1C: 7.1 % — AB (ref 4.6–6.5)

## 2015-12-17 MED ORDER — CLONAZEPAM 0.5 MG PO TABS: 0.5000 mg | ORAL_TABLET | Freq: Two times a day (BID) | ORAL | Status: AC | PRN

## 2015-12-17 NOTE — Assessment & Plan Note (Signed)
Deteriorated.  Pt's daughter recently dx'd w/ MS.  Pt is trying to get back to [facility] to help her and her grandson but son she is living w/ here needs hip replacement revision and she feels torn.  Increase Klonopin to BID as this has helped pt recently.  Pt is embarrassed that she has to take medication at all and plans to wean as soon as she is able.  Will follow closely.

## 2015-12-17 NOTE — Progress Notes (Signed)
Pre visit review using our clinic review tool, if applicable. No additional management support is needed unless otherwise documented below in the visit note. 

## 2015-12-17 NOTE — Assessment & Plan Note (Signed)
Chronic problem.  On ACE for renal protection.  Due for eye exam but cost is a problem.  Check labs.  Adjust meds prn

## 2015-12-17 NOTE — Progress Notes (Signed)
   Subjective:    Patient ID: Darlene Harrell, female    DOB: Jan 05, 1941, 74 y.o.   MRN: [id number]  HPI DM- chronic problem, currently on glimepiride and Metformin.  Not checking sugars due to cost of strips.  Denies symptomatic lows.  Overdue on eye exam- cost is a problem.  UTD on foot exam.  On ACE for renal protection.  No CP, SOB, HAs.  Some visual changes.  No numbness/tingling of hands or feet.  No edema.  Pt has lost 7 lbs since last visit.    Anxiety- pt's daughter recently dx'd w/ MS.  She has increased Klonopin to BID w/ good relief.  Pt has resumed smoking but is trying to quit.   Review of Systems For ROS see HPI     Objective:   Physical Exam  Constitutional: She is oriented to person, place, and time. She appears well-developed and well-nourished. No distress.  obese  HENT:  Head: Normocephalic and atraumatic.  Eyes: Conjunctivae and EOM are normal. Pupils are equal, round, and reactive to light.  Neck: Normal range of motion. Neck supple. No thyromegaly present.  Cardiovascular: Normal rate, regular rhythm, normal heart sounds and intact distal pulses.   No murmur heard. Pulmonary/Chest: Effort normal and breath sounds normal. No respiratory distress.  Abdominal: Soft. She exhibits no distension. There is no tenderness.  Musculoskeletal: She exhibits no edema.  Lymphadenopathy:    She has no cervical adenopathy.  Neurological: She is alert and oriented to person, place, and time.  Skin: Skin is warm and dry.  Psychiatric: She has a normal mood and affect. Her behavior is normal.  Vitals reviewed.         Assessment & Plan:

## 2015-12-17 NOTE — Patient Instructions (Signed)
Schedule your complete physical in 3-4 months We'll notify you of your lab results and make any changes if needed Continue to work on healthy diet and regular exercise- you can do it! Schedule your eye exam at your convenience- let me know if you need a referral Call with any questions or concerns If you want to join [place] at the new [place] office, any scheduled appointments will automatically transfer and we will see you at [address] [place] [address] [place], [place], [place] [address] ([place]) [place] in there!!!

## 2015-12-18 ENCOUNTER — Telehealth: Payer: Self-pay | Admitting: *Deleted

## 2015-12-18 NOTE — Telephone Encounter (Signed)
Called patient to let her know that her tikosyn had arrived from the company. Will place at the front desk for patient.

## 2015-12-21 ENCOUNTER — Encounter: Payer: Self-pay | Admitting: General Practice

## 2016-01-26 ENCOUNTER — Ambulatory Visit (HOSPITAL_COMMUNITY)
Admission: RE | Admit: 2016-01-26 | Discharge: 2016-01-26 | Disposition: A | Discharge status: Home / Self Care | Payer: Medicare Other | Source: Ambulatory Visit | Attending: Nurse Practitioner | Admitting: Nurse Practitioner

## 2016-01-26 ENCOUNTER — Encounter (HOSPITAL_COMMUNITY): Payer: Self-pay | Admitting: Nurse Practitioner

## 2016-01-26 VITALS — BP 140/78 | HR 83 | Ht 64.0 in | Wt 186.6 lb

## 2016-01-26 DIAGNOSIS — Z7984 Long term (current) use of oral hypoglycemic drugs: Secondary | ICD-10-CM | POA: Insufficient documentation

## 2016-01-26 DIAGNOSIS — Z9221 Personal history of antineoplastic chemotherapy: Secondary | ICD-10-CM | POA: Diagnosis not present

## 2016-01-26 DIAGNOSIS — E663 Overweight: Secondary | ICD-10-CM | POA: Diagnosis not present

## 2016-01-26 DIAGNOSIS — Z6832 Body mass index (BMI) 32.0-32.9, adult: Secondary | ICD-10-CM | POA: Diagnosis not present

## 2016-01-26 DIAGNOSIS — I481 Persistent atrial fibrillation: Secondary | ICD-10-CM | POA: Diagnosis not present

## 2016-01-26 DIAGNOSIS — F419 Anxiety disorder, unspecified: Secondary | ICD-10-CM | POA: Diagnosis not present

## 2016-01-26 DIAGNOSIS — Z833 Family history of diabetes mellitus: Secondary | ICD-10-CM | POA: Insufficient documentation

## 2016-01-26 DIAGNOSIS — F329 Major depressive disorder, single episode, unspecified: Secondary | ICD-10-CM | POA: Insufficient documentation

## 2016-01-26 DIAGNOSIS — F17210 Nicotine dependence, cigarettes, uncomplicated: Secondary | ICD-10-CM | POA: Diagnosis not present

## 2016-01-26 DIAGNOSIS — E7800 Pure hypercholesterolemia, unspecified: Secondary | ICD-10-CM | POA: Diagnosis not present

## 2016-01-26 DIAGNOSIS — Z79899 Other long term (current) drug therapy: Secondary | ICD-10-CM | POA: Insufficient documentation

## 2016-01-26 DIAGNOSIS — I4891 Unspecified atrial fibrillation: Secondary | ICD-10-CM | POA: Diagnosis not present

## 2016-01-26 DIAGNOSIS — Z923 Personal history of irradiation: Secondary | ICD-10-CM | POA: Insufficient documentation

## 2016-01-26 DIAGNOSIS — I4819 Other persistent atrial fibrillation: Secondary | ICD-10-CM

## 2016-01-26 DIAGNOSIS — I10 Essential (primary) hypertension: Secondary | ICD-10-CM | POA: Insufficient documentation

## 2016-01-26 DIAGNOSIS — Z7901 Long term (current) use of anticoagulants: Secondary | ICD-10-CM | POA: Insufficient documentation

## 2016-01-26 DIAGNOSIS — Z8249 Family history of ischemic heart disease and other diseases of the circulatory system: Secondary | ICD-10-CM | POA: Insufficient documentation

## 2016-01-26 DIAGNOSIS — E119 Type 2 diabetes mellitus without complications: Secondary | ICD-10-CM | POA: Diagnosis not present

## 2016-01-26 DIAGNOSIS — Z823 Family history of stroke: Secondary | ICD-10-CM | POA: Diagnosis not present

## 2016-01-26 DIAGNOSIS — Z853 Personal history of malignant neoplasm of breast: Secondary | ICD-10-CM | POA: Insufficient documentation

## 2016-01-26 LAB — MAGNESIUM: MAGNESIUM: 1.9 mg/dL (ref 1.7–2.4)

## 2016-01-26 LAB — BASIC METABOLIC PANEL
ANION GAP: 14 (ref 5–15)
BUN: 19 mg/dL (ref 6–20)
CHLORIDE: 105 mmol/L (ref 101–111)
CO2: 19 mmol/L — AB (ref 22–32)
Calcium: 9.4 mg/dL (ref 8.9–10.3)
Creatinine, Ser: 0.99 mg/dL (ref 0.44–1.00)
GFR calc non Af Amer: 56 mL/min — ABNORMAL LOW (ref >60)
Glucose, Bld: 294 mg/dL — ABNORMAL HIGH (ref 65–99)
POTASSIUM: 4.4 mmol/L (ref 3.5–5.1)
Sodium: 138 mmol/L (ref 135–145)

## 2016-01-26 NOTE — Addendum Note (Signed)
Encounter addended by: Sherran Needs, NP on: 01/26/2016  4:36 PM<BR>     Documentation filed: Notes Section

## 2016-01-26 NOTE — Progress Notes (Addendum)
Patient ID: Darlene Harrell, female   DOB: 10-05-1941, 75 y.o.   MRN: [id number]     Primary Care Physician: Annye Asa, MD Referring Physician: Dr. Willette Alma [place] is a 75 y.o. female with a h/o afib on tikosyn. She reports no afib. No bleeding issues. She is having some issues with depression and because of this feels that she may return to the [place] area to be closer to her grandchildren.  She is here because her son is in the area and she is dependant on him to get her around. Unfortunately, she is quite isolated in her trailer and has very little contact with other people.   She is being compliant with tikosyn and  apixaban.   Today, she denies symptoms of palpitations, chest pain, shortness of breath, orthopnea, PND, lower extremity edema, dizziness, presyncope, syncope, or neurologic sequela. The patient is tolerating medications without difficulties and is otherwise without complaint today.   Past Medical History  Diagnosis Date  . Hypertension   . Depression   . Syncopal episodes   . High cholesterol   . Blood in stool     a. due to hemorrhoids.  . Persistent atrial fibrillation ([place])     a. In setting of Pneumonia 6/13 - Echocardiogram 03/30/12: Mild LVH, normal LV function, EF 60-65%, no wall motion abnormalities, mild MR, mild LAE, PASP 32.  . LV dysfunction 09/09/2014    EF40%  . Overweight   . Chronic systolic CHF (congestive heart failure) ([place])     "when I had pneumonia"  . CAP (community acquired pneumonia) 5/20123  . Type II diabetes mellitus ([place])   . Breast cancer, right breast ([place]) 2010    a. s/p mastectomy/chemo/XRT to R breast, Dr. Marin Olp.  Marland Kitchen Anxiety    Past Surgical History  Procedure Laterality Date  . Tonsillectomy    . Tee without cardioversion N/A 09/09/2014    Procedure: TRANSESOPHAGEAL ECHOCARDIOGRAM (TEE);  Surgeon: Josue Hector, MD;  Location: [name] ENDOSCOPY;  Service: Cardiovascular;  Laterality: N/A;  . Cardioversion N/A  09/09/2014    Procedure: CARDIOVERSION;  Surgeon: Josue Hector, MD;  Location: [facility] ENDOSCOPY;  Service: Cardiovascular;  Laterality: N/A;  . Breast biopsy Right 2010   . Mastectomy Right 2011    Current Outpatient Prescriptions  Medication Sig Dispense Refill  . apixaban (ELIQUIS) 5 MG TABS tablet Take 1 tablet (5 mg total) by mouth 2 (two) times daily. 180 tablet 3  . atorvastatin (LIPITOR) 20 MG tablet TAKE 1 BY MOUTH EVERY DAY 90 tablet 1  . Blood Glucose Calibration (ONETOUCH VERIO) SOLN 1 application by In Vitro route as directed. 1 each 6  . carvedilol (COREG) 25 MG tablet TAKE 1 TABLET BY MOUTH TWICE DAILY WITH A MEAL 60 tablet 11  . Cholecalciferol (VITAMIN D) 2000 UNITS tablet Take 2,000 Units by mouth daily.    . clonazePAM (KLONOPIN) 0.5 MG tablet Take 1 tablet (0.5 mg total) by mouth 2 (two) times daily as needed for anxiety. 60 tablet 2  . dofetilide (TIKOSYN) 250 MCG capsule Take 1 capsule (250 mcg total) by mouth 2 (two) times daily. 180 capsule 3  . furosemide (LASIX) 40 MG tablet Take 1 tablet (40 mg total) by mouth daily. 30 tablet 6  . glimepiride (AMARYL) 4 MG tablet TAKE 1 TABLET(4 MG) BY MOUTH DAILY BEFORE BREAKFAST 90 tablet 1  . glucose blood (ONETOUCH VERIO) test strip Test 1-2x daily due to labile blood sugars Dx:E11.9 100 each 12  .  letrozole (FEMARA) 2.5 MG tablet Take 1 tablet (2.5 mg total) by mouth daily. 90 day supply. 180 tablet 0  . lisinopril (PRINIVIL,ZESTRIL) 10 MG tablet Take 1 tablet (10 mg total) by mouth daily. 90 tablet 3  . magnesium oxide (MAGNESIUM-OXIDE) 400 (241.3 MG) MG tablet Take 1 tablet (400 mg total) by mouth 2 (two) times daily. 60 tablet 11  . metFORMIN (GLUCOPHAGE) 1000 MG tablet TAKE ONE TABLET BY MOUTH TWICE DAILY WITH  A  MEAL 180 tablet 1  . nicotine polacrilex (NICORETTE) 4 MG gum Take 4 mg by mouth daily as needed for smoking cessation.     [name] DELICA LANCETS [id number] MISC Use as directed to test sugars 100 each 3  . potassium  chloride SA (K-DUR,KLOR-CON) 20 MEQ tablet TAKE 1 TABLET BY MOUTH DAILY 90 tablet 6   No current facility-administered medications for this encounter.    No Known Allergies  Social History   Social History  . Marital Status: Divorced    Spouse Name: N/A  . Number of Children: N/A  . Years of Education: N/A   Occupational History  . Not on file.   Social History Main Topics  . Smoking status: Current Every Day Smoker -- 1.00 packs/day for 48 years    Types: Cigarettes    Start date: 09/03/1960    Last Attempt to Quit: 10/11/2014  . Smokeless tobacco: Never Used     Comment: 01/19/2015  She presently chews nicorette gum.  Marland Kitchen Alcohol Use: 0.0 oz/week    0 Standard drinks or equivalent per week     Comment: 01/19/2015 "I'll have a couple shots in my coffee/year"  . Drug Use: No  . Sexual Activity: No   Other Topics Concern  . Not on file   Social History Narrative   Lives in [place] but spends some time in [place] visiting family (2-3 months per year)    Family History  Problem Relation Age of Onset  . Stroke Mother   . Hypertension Mother   . Mental retardation Mother   . Diabetes Mother   . Alcohol abuse Father   . Hypertension Father   . Diabetes Father   . Diabetes Son   . Kidney failure Son   . Early death Son   . Hyperlipidemia Son   . Heart disease Mother     ROS- All systems are reviewed and negative except as per the HPI above  Physical Exam: Filed Vitals:   01/26/16 1117  BP: 140/78  Pulse: 83  Height: 5\' 4"  (1.626 m)  Weight: 186 lb 9.6 oz (84.641 kg)    GEN- The patient is well appearing, alert and oriented x 3 today.   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 Rayann Heman.  Assessment and Plan:  1. afib Doing well staying in SR Continue Tikosyn 250 mg bid Continue apixaban 5 mg bid for chadsvasc score of at least 4 Bmet/mag today Reminded if she returns  to [place] that she will need to establish with a cardiologist promptly  For now will schedule for 3 month f/u in afib clinic If any adjustments are needed due to QTC after review with Dr. Rayann Heman, she will be notified  Butch Penny C. Dorn Hartshorne, [facility] [place], [place] [address] [phone]  Addendum:  I reviewed EKG with Dr. Rayann Heman and he also believes that the QTC looks prolonged  due to increase in QRS int and does not believe there is need for adjustment of tikosyn.

## 2016-02-09 ENCOUNTER — Other Ambulatory Visit: Payer: Self-pay | Admitting: Nurse Practitioner

## 2016-02-09 ENCOUNTER — Other Ambulatory Visit: Payer: Self-pay | Admitting: Family Medicine

## 2016-02-09 NOTE — Telephone Encounter (Signed)
Medication filled to pharmacy as requested.   

## 2016-02-26 ENCOUNTER — Telehealth: Payer: Self-pay

## 2016-02-26 NOTE — Telephone Encounter (Signed)
Spoke with patient today, to let her know her Phyllis Ginger has arrived from [employer]. She states she will have her son pick it up next week.

## 2016-03-16 DIAGNOSIS — H2513 Age-related nuclear cataract, bilateral: Secondary | ICD-10-CM | POA: Diagnosis not present

## 2016-03-16 DIAGNOSIS — E119 Type 2 diabetes mellitus without complications: Secondary | ICD-10-CM | POA: Diagnosis not present

## 2016-03-21 ENCOUNTER — Other Ambulatory Visit: Payer: Self-pay | Admitting: Family Medicine

## 2016-03-21 NOTE — Telephone Encounter (Signed)
Medication filled to pharmacy as requested.   

## 2016-04-19 ENCOUNTER — Ambulatory Visit: Payer: Medicare Other | Admitting: Family Medicine

## 2016-04-19 DIAGNOSIS — E119 Type 2 diabetes mellitus without complications: Secondary | ICD-10-CM | POA: Diagnosis not present

## 2016-04-19 DIAGNOSIS — I480 Paroxysmal atrial fibrillation: Secondary | ICD-10-CM | POA: Diagnosis not present

## 2016-04-19 DIAGNOSIS — I10 Essential (primary) hypertension: Secondary | ICD-10-CM | POA: Diagnosis not present

## 2016-04-19 DIAGNOSIS — Z7984 Long term (current) use of oral hypoglycemic drugs: Secondary | ICD-10-CM | POA: Diagnosis not present

## 2016-04-23 DIAGNOSIS — I10 Essential (primary) hypertension: Secondary | ICD-10-CM | POA: Diagnosis not present

## 2016-04-23 DIAGNOSIS — Z0000 Encounter for general adult medical examination without abnormal findings: Secondary | ICD-10-CM | POA: Diagnosis not present

## 2016-04-23 DIAGNOSIS — E119 Type 2 diabetes mellitus without complications: Secondary | ICD-10-CM | POA: Diagnosis not present

## 2016-04-27 ENCOUNTER — Inpatient Hospital Stay (HOSPITAL_COMMUNITY): Admission: RE | Admit: 2016-04-27 | Payer: Medicare Other | Source: Ambulatory Visit | Admitting: Nurse Practitioner

## 2016-05-06 ENCOUNTER — Telehealth: Payer: Self-pay | Admitting: *Deleted

## 2016-05-06 NOTE — Telephone Encounter (Signed)
Called patient to let her know that her tikosyn had arrived from the company and will be at the front desk.

## 2016-05-16 DIAGNOSIS — Z1212 Encounter for screening for malignant neoplasm of rectum: Secondary | ICD-10-CM | POA: Diagnosis not present

## 2016-05-16 DIAGNOSIS — Z1211 Encounter for screening for malignant neoplasm of colon: Secondary | ICD-10-CM | POA: Diagnosis not present

## 2016-05-17 ENCOUNTER — Other Ambulatory Visit (HOSPITAL_COMMUNITY): Payer: Self-pay | Admitting: Nurse Practitioner

## 2016-05-17 ENCOUNTER — Other Ambulatory Visit: Payer: Self-pay | Admitting: Family Medicine

## 2016-05-17 DIAGNOSIS — D649 Anemia, unspecified: Secondary | ICD-10-CM | POA: Diagnosis not present

## 2016-05-17 DIAGNOSIS — R946 Abnormal results of thyroid function studies: Secondary | ICD-10-CM | POA: Diagnosis not present

## 2016-05-18 DIAGNOSIS — I10 Essential (primary) hypertension: Secondary | ICD-10-CM | POA: Diagnosis not present

## 2016-05-18 DIAGNOSIS — Z8679 Personal history of other diseases of the circulatory system: Secondary | ICD-10-CM | POA: Diagnosis not present

## 2016-05-18 DIAGNOSIS — E7800 Pure hypercholesterolemia, unspecified: Secondary | ICD-10-CM | POA: Diagnosis not present

## 2016-05-18 DIAGNOSIS — I481 Persistent atrial fibrillation: Secondary | ICD-10-CM | POA: Diagnosis not present

## 2016-05-18 DIAGNOSIS — E119 Type 2 diabetes mellitus without complications: Secondary | ICD-10-CM | POA: Diagnosis not present

## 2016-05-18 DIAGNOSIS — I2510 Atherosclerotic heart disease of native coronary artery without angina pectoris: Secondary | ICD-10-CM | POA: Diagnosis not present

## 2016-05-18 NOTE — Telephone Encounter (Signed)
Medication filled to pharmacy as requested.   

## 2016-06-13 DIAGNOSIS — Z170 Estrogen receptor positive status [ER+]: Secondary | ICD-10-CM | POA: Diagnosis not present

## 2016-06-13 DIAGNOSIS — C50911 Malignant neoplasm of unspecified site of right female breast: Secondary | ICD-10-CM | POA: Diagnosis not present

## 2016-06-18 DIAGNOSIS — R946 Abnormal results of thyroid function studies: Secondary | ICD-10-CM | POA: Diagnosis not present

## 2016-06-27 IMAGING — CR DG CHEST 2V
2 series · 2 of 2 positions shown · non-contrast
Comparison: 03/29/2012

CLINICAL DATA: History of breast cancer and previous smoking
history

EXAM:
CHEST  2 VIEW

[w chest pa]
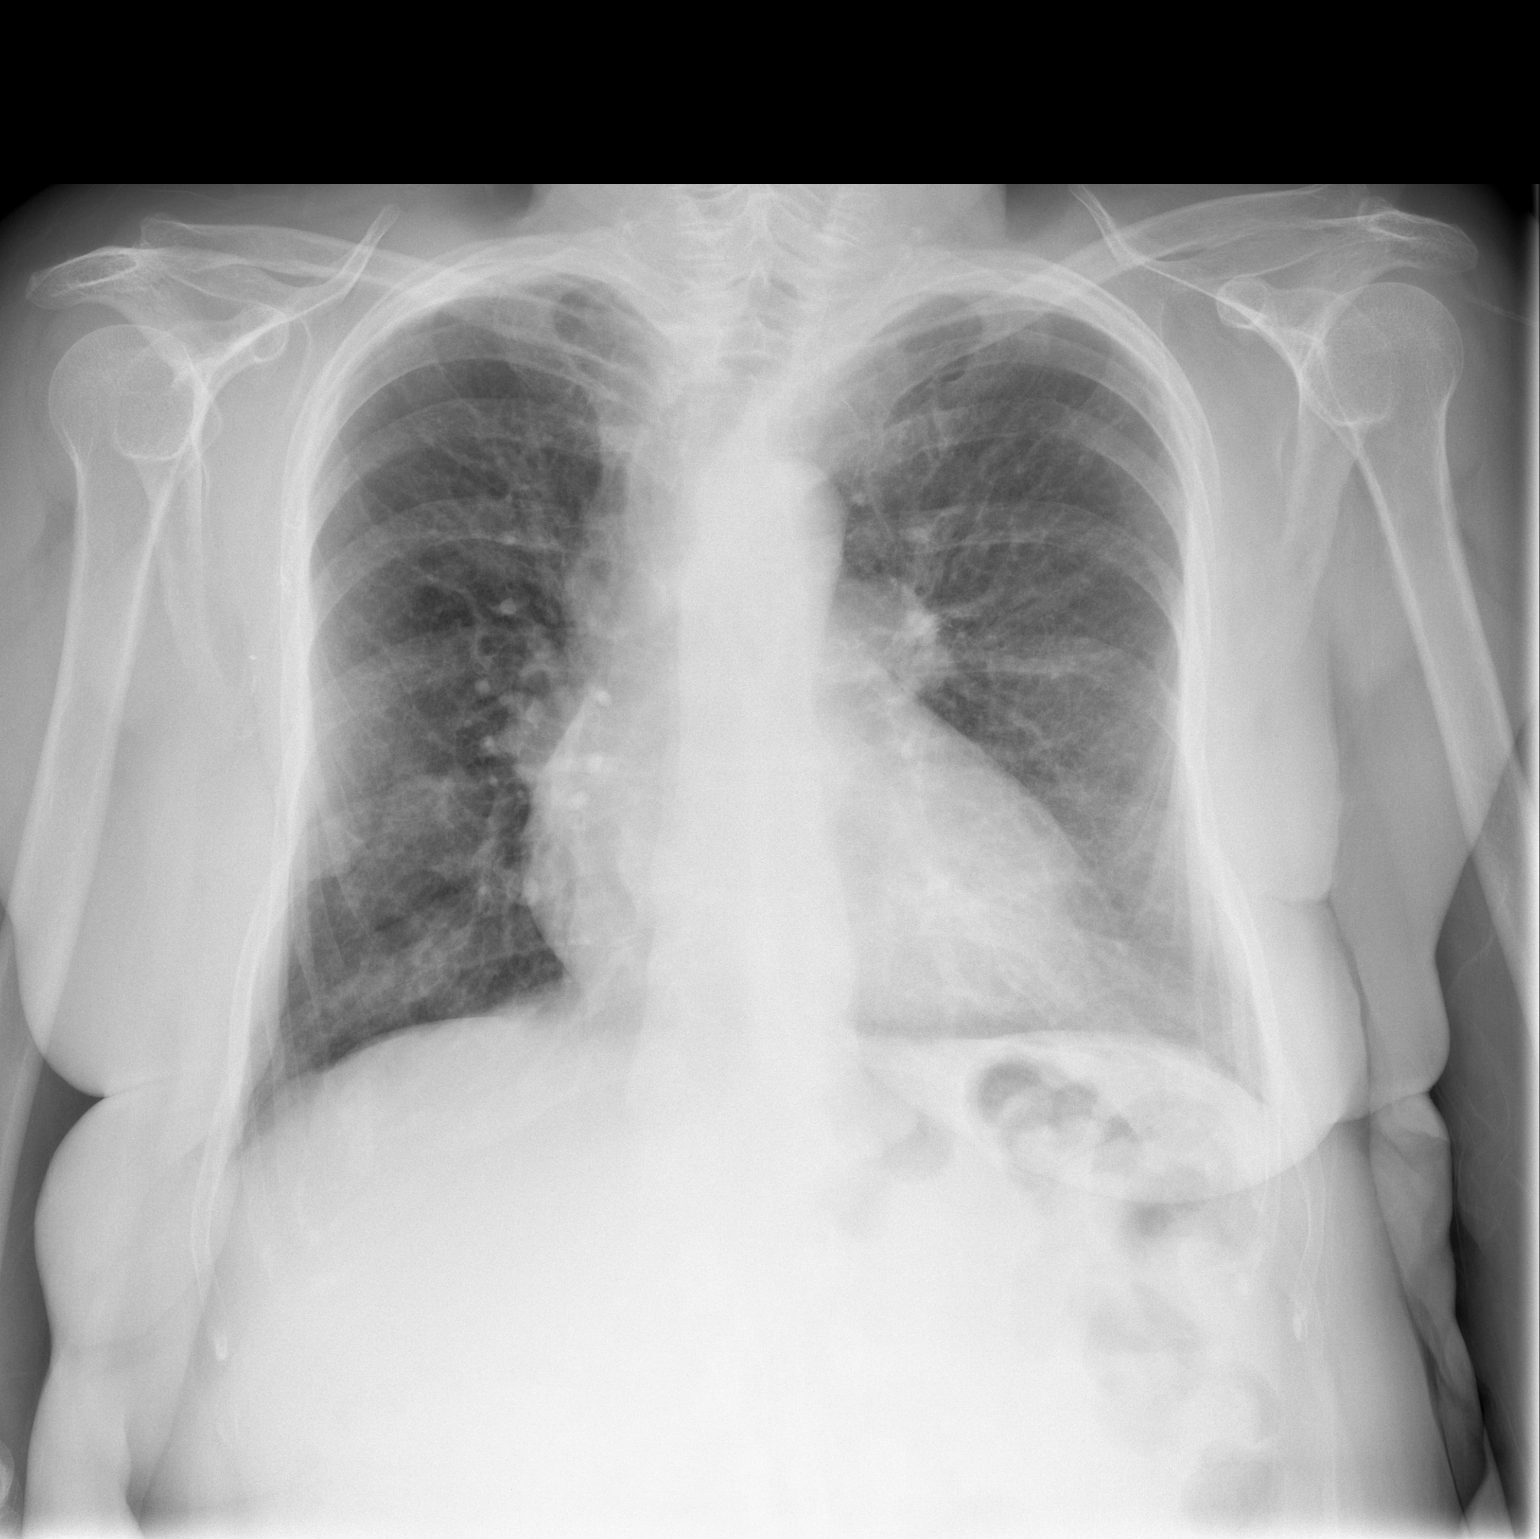

[w chest lat]
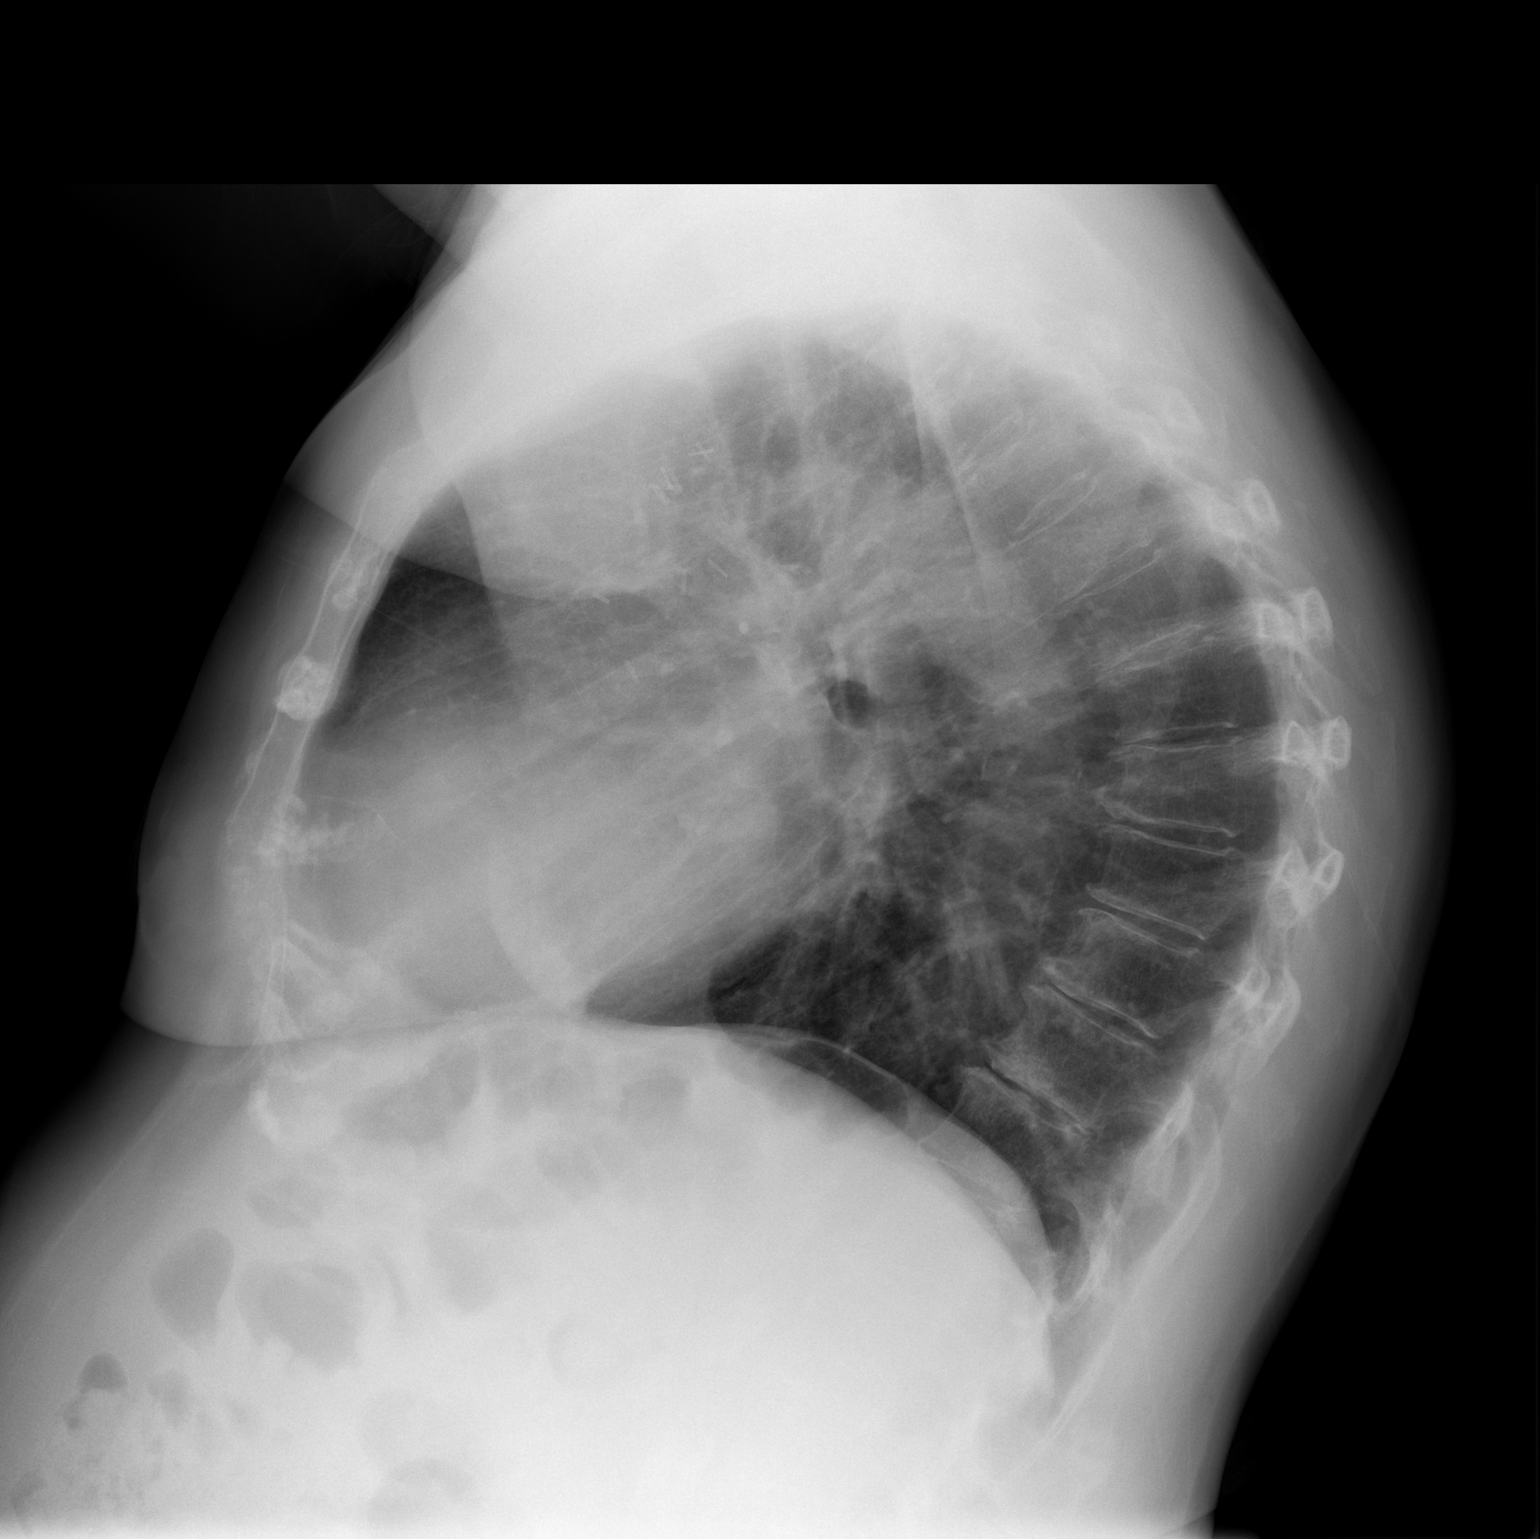

[2 of 2 positions shown; findings below may reference images not displayed]

FINDINGS: Cardiac shadow is stable. The lungs are well aerated bilaterally
without focal infiltrate or sizable effusion. Previously seen chest
wall port is been removed in the interval. Postsurgical changes are
noted on the right.
IMPRESSION: No active cardiopulmonary disease.

## 2016-07-16 ENCOUNTER — Other Ambulatory Visit: Payer: Self-pay | Admitting: Family Medicine

## 2016-07-25 ENCOUNTER — Other Ambulatory Visit: Payer: Self-pay | Admitting: Nurse Practitioner

## 2016-07-25 DIAGNOSIS — E0590 Thyrotoxicosis, unspecified without thyrotoxic crisis or storm: Secondary | ICD-10-CM | POA: Diagnosis not present

## 2016-07-25 DIAGNOSIS — R946 Abnormal results of thyroid function studies: Secondary | ICD-10-CM | POA: Diagnosis not present

## 2016-07-30 DIAGNOSIS — R946 Abnormal results of thyroid function studies: Secondary | ICD-10-CM | POA: Diagnosis not present

## 2016-07-30 DIAGNOSIS — I10 Essential (primary) hypertension: Secondary | ICD-10-CM | POA: Diagnosis not present

## 2016-07-30 DIAGNOSIS — D649 Anemia, unspecified: Secondary | ICD-10-CM | POA: Diagnosis not present

## 2016-07-30 DIAGNOSIS — Z23 Encounter for immunization: Secondary | ICD-10-CM | POA: Diagnosis not present

## 2016-07-30 DIAGNOSIS — E119 Type 2 diabetes mellitus without complications: Secondary | ICD-10-CM | POA: Diagnosis not present

## 2016-07-30 DIAGNOSIS — E0590 Thyrotoxicosis, unspecified without thyrotoxic crisis or storm: Secondary | ICD-10-CM | POA: Diagnosis not present

## 2016-08-01 DIAGNOSIS — E0520 Thyrotoxicosis with toxic multinodular goiter without thyrotoxic crisis or storm: Secondary | ICD-10-CM | POA: Diagnosis not present

## 2016-08-01 DIAGNOSIS — E0590 Thyrotoxicosis, unspecified without thyrotoxic crisis or storm: Secondary | ICD-10-CM | POA: Diagnosis not present

## 2016-08-01 DIAGNOSIS — E042 Nontoxic multinodular goiter: Secondary | ICD-10-CM | POA: Diagnosis not present

## 2016-08-08 DIAGNOSIS — E042 Nontoxic multinodular goiter: Secondary | ICD-10-CM | POA: Diagnosis not present

## 2016-08-08 DIAGNOSIS — R7989 Other specified abnormal findings of blood chemistry: Secondary | ICD-10-CM | POA: Diagnosis not present

## 2016-08-17 DIAGNOSIS — E039 Hypothyroidism, unspecified: Secondary | ICD-10-CM | POA: Diagnosis not present

## 2016-08-17 DIAGNOSIS — E0590 Thyrotoxicosis, unspecified without thyrotoxic crisis or storm: Secondary | ICD-10-CM | POA: Diagnosis not present

## 2016-08-18 DIAGNOSIS — E0590 Thyrotoxicosis, unspecified without thyrotoxic crisis or storm: Secondary | ICD-10-CM | POA: Diagnosis not present

## 2016-08-22 DIAGNOSIS — I4891 Unspecified atrial fibrillation: Secondary | ICD-10-CM | POA: Diagnosis not present

## 2016-08-22 DIAGNOSIS — R7989 Other specified abnormal findings of blood chemistry: Secondary | ICD-10-CM | POA: Diagnosis not present

## 2016-08-22 DIAGNOSIS — E042 Nontoxic multinodular goiter: Secondary | ICD-10-CM | POA: Diagnosis not present

## 2016-08-23 ENCOUNTER — Other Ambulatory Visit: Payer: Self-pay | Admitting: Family

## 2016-08-23 DIAGNOSIS — Z452 Encounter for adjustment and management of vascular access device: Secondary | ICD-10-CM

## 2016-08-24 DIAGNOSIS — Z853 Personal history of malignant neoplasm of breast: Secondary | ICD-10-CM | POA: Diagnosis not present

## 2016-08-24 DIAGNOSIS — Z1231 Encounter for screening mammogram for malignant neoplasm of breast: Secondary | ICD-10-CM | POA: Diagnosis not present

## 2016-08-29 IMAGING — CR DG CHEST 1V PORT
1 series · 1 of 1 positions shown · non-contrast
Comparison: 07/04/2014

CLINICAL DATA: Shortness of breath that worsens with walking.
Atrial fibrillation. History of smoking and breast cancer. History
of diabetes and right mastectomy.

EXAM:
PORTABLE CHEST - 1 VIEW

[view not recorded]
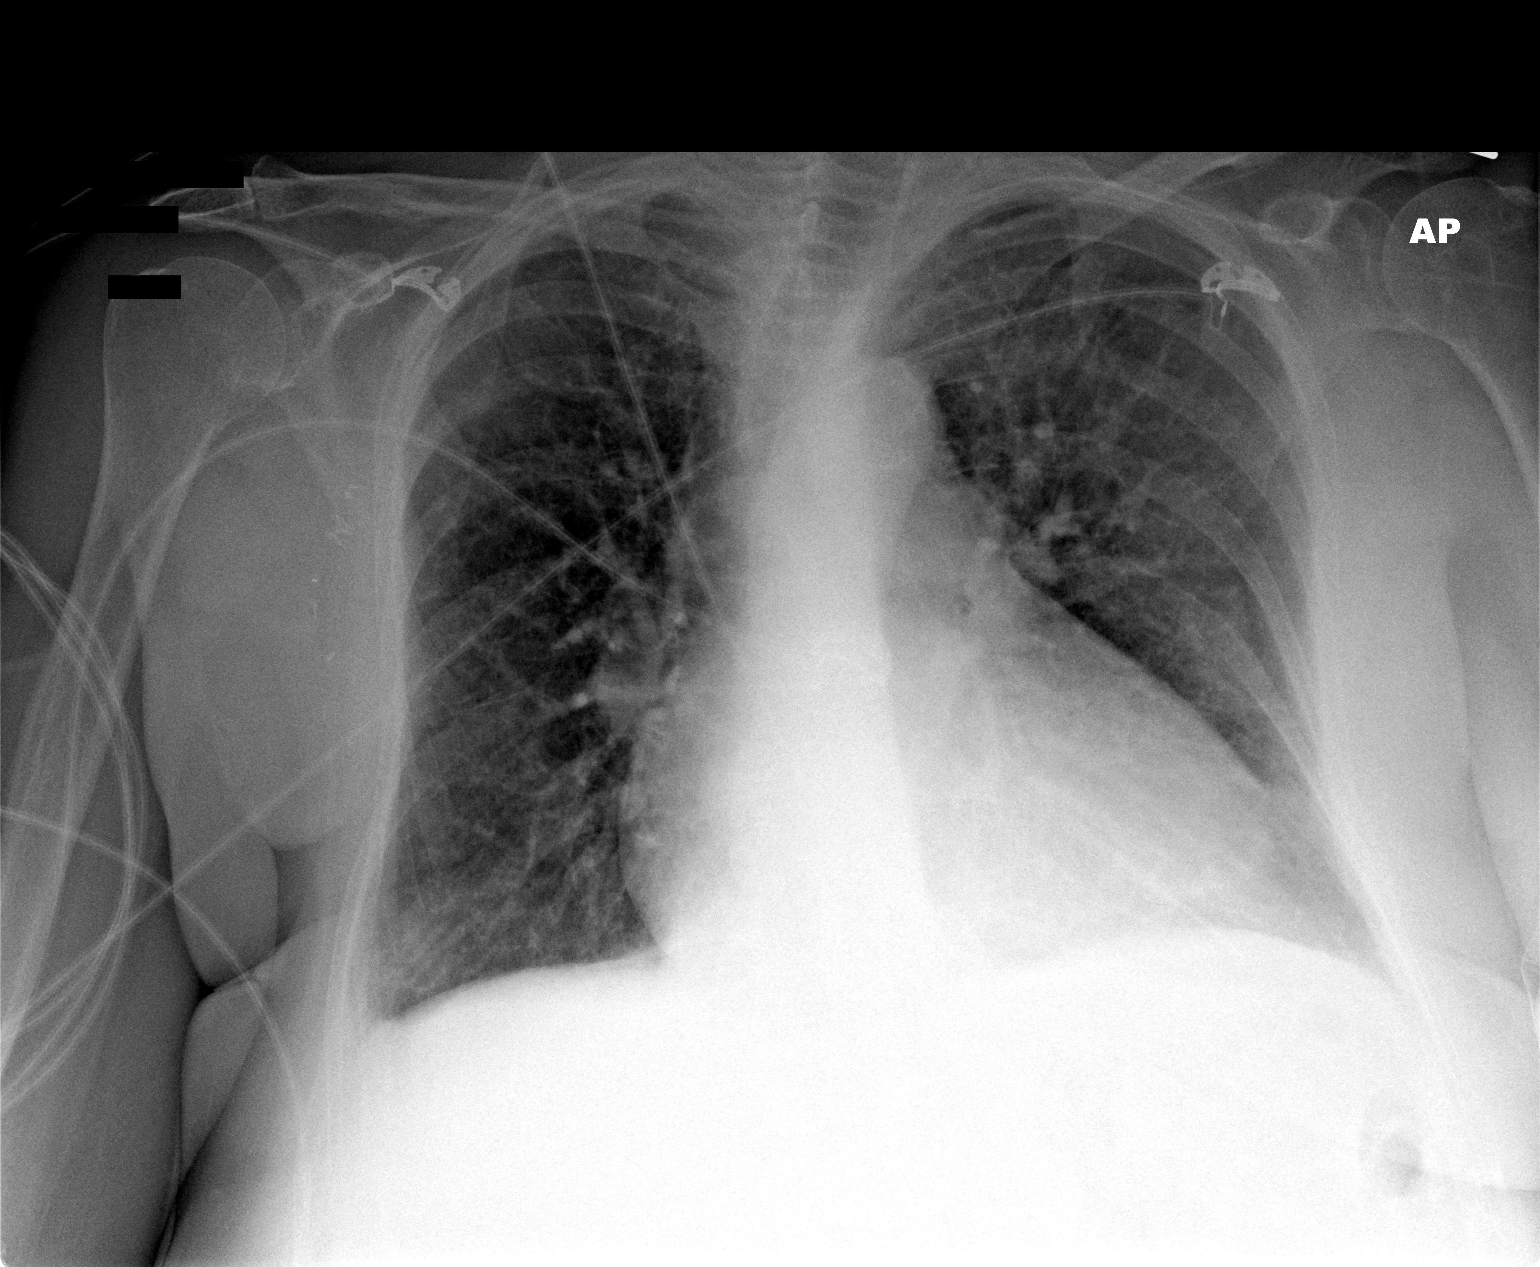

[1 of 1 positions shown; findings below may reference images not displayed]

FINDINGS: The heart is enlarged. There are no focal consolidations or pleural
effusions. Small right pleural effusion is noted. Suspect mild
interstitial pulmonary edema. No overt alveolar edema. Surgical
clips are identified in in the right axillary region.
IMPRESSION: Cardiomegaly and mild interstitial edema.

Small right pleural effusion.

## 2016-09-07 ENCOUNTER — Other Ambulatory Visit: Payer: Self-pay | Admitting: Family Medicine

## 2016-09-17 DIAGNOSIS — E0590 Thyrotoxicosis, unspecified without thyrotoxic crisis or storm: Secondary | ICD-10-CM | POA: Diagnosis not present

## 2016-09-17 DIAGNOSIS — Z0000 Encounter for general adult medical examination without abnormal findings: Secondary | ICD-10-CM | POA: Diagnosis not present

## 2016-09-17 DIAGNOSIS — D649 Anemia, unspecified: Secondary | ICD-10-CM | POA: Diagnosis not present

## 2016-09-17 DIAGNOSIS — E042 Nontoxic multinodular goiter: Secondary | ICD-10-CM | POA: Diagnosis not present

## 2016-09-17 DIAGNOSIS — I10 Essential (primary) hypertension: Secondary | ICD-10-CM | POA: Diagnosis not present

## 2016-09-17 DIAGNOSIS — E119 Type 2 diabetes mellitus without complications: Secondary | ICD-10-CM | POA: Diagnosis not present

## 2016-09-17 DIAGNOSIS — R946 Abnormal results of thyroid function studies: Secondary | ICD-10-CM | POA: Diagnosis not present

## 2016-09-17 DIAGNOSIS — Z7984 Long term (current) use of oral hypoglycemic drugs: Secondary | ICD-10-CM | POA: Diagnosis not present

## 2016-09-28 DIAGNOSIS — C50911 Malignant neoplasm of unspecified site of right female breast: Secondary | ICD-10-CM | POA: Diagnosis not present

## 2016-09-28 DIAGNOSIS — E538 Deficiency of other specified B group vitamins: Secondary | ICD-10-CM | POA: Diagnosis not present

## 2016-09-28 DIAGNOSIS — E042 Nontoxic multinodular goiter: Secondary | ICD-10-CM | POA: Diagnosis not present

## 2016-09-28 DIAGNOSIS — J069 Acute upper respiratory infection, unspecified: Secondary | ICD-10-CM | POA: Diagnosis not present

## 2016-09-28 DIAGNOSIS — D649 Anemia, unspecified: Secondary | ICD-10-CM | POA: Diagnosis not present

## 2016-10-21 DIAGNOSIS — I10 Essential (primary) hypertension: Secondary | ICD-10-CM | POA: Diagnosis not present

## 2016-10-21 DIAGNOSIS — I480 Paroxysmal atrial fibrillation: Secondary | ICD-10-CM | POA: Diagnosis not present

## 2016-10-29 DIAGNOSIS — E538 Deficiency of other specified B group vitamins: Secondary | ICD-10-CM | POA: Diagnosis not present

## 2016-11-02 DIAGNOSIS — E042 Nontoxic multinodular goiter: Secondary | ICD-10-CM | POA: Diagnosis not present

## 2016-12-02 DIAGNOSIS — C50911 Malignant neoplasm of unspecified site of right female breast: Secondary | ICD-10-CM | POA: Diagnosis not present

## 2016-12-02 DIAGNOSIS — Z170 Estrogen receptor positive status [ER+]: Secondary | ICD-10-CM | POA: Diagnosis not present

## 2016-12-10 ENCOUNTER — Other Ambulatory Visit: Payer: Self-pay | Admitting: Cardiovascular Disease

## 2016-12-27 DIAGNOSIS — I480 Paroxysmal atrial fibrillation: Secondary | ICD-10-CM | POA: Diagnosis not present

## 2016-12-27 DIAGNOSIS — I10 Essential (primary) hypertension: Secondary | ICD-10-CM | POA: Diagnosis not present

## 2016-12-27 DIAGNOSIS — Z7984 Long term (current) use of oral hypoglycemic drugs: Secondary | ICD-10-CM | POA: Diagnosis not present

## 2016-12-27 DIAGNOSIS — E119 Type 2 diabetes mellitus without complications: Secondary | ICD-10-CM | POA: Diagnosis not present

## 2016-12-27 DIAGNOSIS — R946 Abnormal results of thyroid function studies: Secondary | ICD-10-CM | POA: Diagnosis not present

## 2016-12-27 DIAGNOSIS — E0590 Thyrotoxicosis, unspecified without thyrotoxic crisis or storm: Secondary | ICD-10-CM | POA: Diagnosis not present

## 2017-01-07 DIAGNOSIS — R946 Abnormal results of thyroid function studies: Secondary | ICD-10-CM | POA: Diagnosis not present

## 2017-01-07 DIAGNOSIS — I10 Essential (primary) hypertension: Secondary | ICD-10-CM | POA: Diagnosis not present

## 2017-01-07 DIAGNOSIS — E119 Type 2 diabetes mellitus without complications: Secondary | ICD-10-CM | POA: Diagnosis not present

## 2017-01-07 DIAGNOSIS — I480 Paroxysmal atrial fibrillation: Secondary | ICD-10-CM | POA: Diagnosis not present

## 2017-01-07 DIAGNOSIS — E0590 Thyrotoxicosis, unspecified without thyrotoxic crisis or storm: Secondary | ICD-10-CM | POA: Diagnosis not present

## 2017-01-17 ENCOUNTER — Telehealth: Payer: Self-pay

## 2017-01-17 DIAGNOSIS — E538 Deficiency of other specified B group vitamins: Secondary | ICD-10-CM | POA: Diagnosis not present

## 2017-01-17 DIAGNOSIS — E119 Type 2 diabetes mellitus without complications: Secondary | ICD-10-CM | POA: Diagnosis not present

## 2017-01-17 DIAGNOSIS — Z7984 Long term (current) use of oral hypoglycemic drugs: Secondary | ICD-10-CM | POA: Diagnosis not present

## 2017-01-17 DIAGNOSIS — D649 Anemia, unspecified: Secondary | ICD-10-CM | POA: Diagnosis not present

## 2017-01-17 NOTE — Telephone Encounter (Signed)
Called patient to discuss/schedule AWV and determine if she continues to reside [place]. Number invalid.

## 2017-01-18 ENCOUNTER — Other Ambulatory Visit: Payer: Self-pay | Admitting: Cardiovascular Disease

## 2017-01-18 NOTE — Telephone Encounter (Signed)
Medication Detail    Disp Refills Start End   carvedilol (COREG) 25 MG tablet 60 tablet 3 12/12/2016    Sig: TAKE 1 TABLET BY MOUTH TWICE DAILY WITH A MEAL   E-Prescribing Status: Receipt confirmed by pharmacy (12/12/2016 11:56 AM EST)   Pharmacy   [id number] - [place], [place] - [address] AT [place]

## 2017-02-18 DIAGNOSIS — E538 Deficiency of other specified B group vitamins: Secondary | ICD-10-CM | POA: Diagnosis not present

## 2017-03-18 DIAGNOSIS — E119 Type 2 diabetes mellitus without complications: Secondary | ICD-10-CM | POA: Diagnosis not present

## 2017-03-18 DIAGNOSIS — E538 Deficiency of other specified B group vitamins: Secondary | ICD-10-CM | POA: Diagnosis not present

## 2017-04-15 DIAGNOSIS — E538 Deficiency of other specified B group vitamins: Secondary | ICD-10-CM | POA: Diagnosis not present

## 2017-04-15 DIAGNOSIS — I10 Essential (primary) hypertension: Secondary | ICD-10-CM | POA: Diagnosis not present

## 2017-04-15 DIAGNOSIS — D649 Anemia, unspecified: Secondary | ICD-10-CM | POA: Diagnosis not present

## 2017-04-15 DIAGNOSIS — Z7984 Long term (current) use of oral hypoglycemic drugs: Secondary | ICD-10-CM | POA: Diagnosis not present

## 2017-04-15 DIAGNOSIS — E119 Type 2 diabetes mellitus without complications: Secondary | ICD-10-CM | POA: Diagnosis not present

## 2017-04-15 DIAGNOSIS — E042 Nontoxic multinodular goiter: Secondary | ICD-10-CM | POA: Diagnosis not present

## 2017-04-15 DIAGNOSIS — E0590 Thyrotoxicosis, unspecified without thyrotoxic crisis or storm: Secondary | ICD-10-CM | POA: Diagnosis not present

## 2017-04-24 DIAGNOSIS — I480 Paroxysmal atrial fibrillation: Secondary | ICD-10-CM | POA: Diagnosis not present

## 2017-04-24 DIAGNOSIS — I10 Essential (primary) hypertension: Secondary | ICD-10-CM | POA: Diagnosis not present

## 2017-04-24 DIAGNOSIS — Z7984 Long term (current) use of oral hypoglycemic drugs: Secondary | ICD-10-CM | POA: Diagnosis not present

## 2017-04-24 DIAGNOSIS — D649 Anemia, unspecified: Secondary | ICD-10-CM | POA: Diagnosis not present

## 2017-04-24 DIAGNOSIS — E119 Type 2 diabetes mellitus without complications: Secondary | ICD-10-CM | POA: Diagnosis not present

## 2017-05-24 DIAGNOSIS — E119 Type 2 diabetes mellitus without complications: Secondary | ICD-10-CM | POA: Diagnosis not present

## 2017-05-24 DIAGNOSIS — H25033 Anterior subcapsular polar age-related cataract, bilateral: Secondary | ICD-10-CM | POA: Diagnosis not present

## 2017-05-24 DIAGNOSIS — H2513 Age-related nuclear cataract, bilateral: Secondary | ICD-10-CM | POA: Diagnosis not present

## 2017-06-13 ENCOUNTER — Other Ambulatory Visit (HOSPITAL_COMMUNITY): Payer: Self-pay | Admitting: Nurse Practitioner

## 2017-06-17 DIAGNOSIS — E538 Deficiency of other specified B group vitamins: Secondary | ICD-10-CM | POA: Diagnosis not present

## 2017-07-26 DIAGNOSIS — H269 Unspecified cataract: Secondary | ICD-10-CM | POA: Diagnosis not present

## 2017-07-26 DIAGNOSIS — E538 Deficiency of other specified B group vitamins: Secondary | ICD-10-CM | POA: Diagnosis not present

## 2017-07-26 DIAGNOSIS — Z7984 Long term (current) use of oral hypoglycemic drugs: Secondary | ICD-10-CM | POA: Diagnosis not present

## 2017-07-26 DIAGNOSIS — I480 Paroxysmal atrial fibrillation: Secondary | ICD-10-CM | POA: Diagnosis not present

## 2017-07-26 DIAGNOSIS — Z23 Encounter for immunization: Secondary | ICD-10-CM | POA: Diagnosis not present

## 2017-07-26 DIAGNOSIS — Z8709 Personal history of other diseases of the respiratory system: Secondary | ICD-10-CM | POA: Diagnosis not present

## 2017-07-26 DIAGNOSIS — Z01818 Encounter for other preprocedural examination: Secondary | ICD-10-CM | POA: Diagnosis not present

## 2017-07-26 DIAGNOSIS — E119 Type 2 diabetes mellitus without complications: Secondary | ICD-10-CM | POA: Diagnosis not present

## 2017-07-26 DIAGNOSIS — I10 Essential (primary) hypertension: Secondary | ICD-10-CM | POA: Diagnosis not present

## 2017-07-26 DIAGNOSIS — D649 Anemia, unspecified: Secondary | ICD-10-CM | POA: Diagnosis not present

## 2017-07-31 DIAGNOSIS — H269 Unspecified cataract: Secondary | ICD-10-CM | POA: Diagnosis not present

## 2017-07-31 DIAGNOSIS — E785 Hyperlipidemia, unspecified: Secondary | ICD-10-CM | POA: Diagnosis not present

## 2017-07-31 DIAGNOSIS — Z7901 Long term (current) use of anticoagulants: Secondary | ICD-10-CM | POA: Diagnosis not present

## 2017-07-31 DIAGNOSIS — Z87891 Personal history of nicotine dependence: Secondary | ICD-10-CM | POA: Diagnosis not present

## 2017-07-31 DIAGNOSIS — C50911 Malignant neoplasm of unspecified site of right female breast: Secondary | ICD-10-CM | POA: Diagnosis not present

## 2017-07-31 DIAGNOSIS — I2510 Atherosclerotic heart disease of native coronary artery without angina pectoris: Secondary | ICD-10-CM | POA: Diagnosis not present

## 2017-07-31 DIAGNOSIS — J449 Chronic obstructive pulmonary disease, unspecified: Secondary | ICD-10-CM | POA: Diagnosis not present

## 2017-07-31 DIAGNOSIS — H2511 Age-related nuclear cataract, right eye: Secondary | ICD-10-CM | POA: Diagnosis not present

## 2017-07-31 DIAGNOSIS — E538 Deficiency of other specified B group vitamins: Secondary | ICD-10-CM | POA: Diagnosis not present

## 2017-07-31 DIAGNOSIS — E0590 Thyrotoxicosis, unspecified without thyrotoxic crisis or storm: Secondary | ICD-10-CM | POA: Diagnosis not present

## 2017-07-31 DIAGNOSIS — H2589 Other age-related cataract: Secondary | ICD-10-CM | POA: Diagnosis not present

## 2017-07-31 DIAGNOSIS — D509 Iron deficiency anemia, unspecified: Secondary | ICD-10-CM | POA: Diagnosis not present

## 2017-07-31 DIAGNOSIS — Z794 Long term (current) use of insulin: Secondary | ICD-10-CM | POA: Diagnosis not present

## 2017-07-31 DIAGNOSIS — I10 Essential (primary) hypertension: Secondary | ICD-10-CM | POA: Diagnosis not present

## 2017-07-31 DIAGNOSIS — H25031 Anterior subcapsular polar age-related cataract, right eye: Secondary | ICD-10-CM | POA: Diagnosis not present

## 2017-07-31 DIAGNOSIS — E7800 Pure hypercholesterolemia, unspecified: Secondary | ICD-10-CM | POA: Diagnosis not present

## 2017-07-31 DIAGNOSIS — E119 Type 2 diabetes mellitus without complications: Secondary | ICD-10-CM | POA: Diagnosis not present

## 2017-07-31 DIAGNOSIS — I480 Paroxysmal atrial fibrillation: Secondary | ICD-10-CM | POA: Diagnosis not present

## 2017-07-31 DIAGNOSIS — H2189 Other specified disorders of iris and ciliary body: Secondary | ICD-10-CM | POA: Diagnosis not present

## 2017-08-01 DIAGNOSIS — Z961 Presence of intraocular lens: Secondary | ICD-10-CM | POA: Diagnosis not present

## 2017-08-08 DIAGNOSIS — F17200 Nicotine dependence, unspecified, uncomplicated: Secondary | ICD-10-CM | POA: Diagnosis not present

## 2017-08-08 DIAGNOSIS — R0602 Shortness of breath: Secondary | ICD-10-CM | POA: Diagnosis not present

## 2017-08-08 DIAGNOSIS — I480 Paroxysmal atrial fibrillation: Secondary | ICD-10-CM | POA: Diagnosis not present

## 2017-08-08 DIAGNOSIS — I10 Essential (primary) hypertension: Secondary | ICD-10-CM | POA: Diagnosis not present

## 2017-08-09 DIAGNOSIS — Z961 Presence of intraocular lens: Secondary | ICD-10-CM | POA: Diagnosis not present

## 2017-08-14 DIAGNOSIS — I480 Paroxysmal atrial fibrillation: Secondary | ICD-10-CM | POA: Diagnosis not present

## 2017-08-14 DIAGNOSIS — E0590 Thyrotoxicosis, unspecified without thyrotoxic crisis or storm: Secondary | ICD-10-CM | POA: Diagnosis not present

## 2017-08-14 DIAGNOSIS — I10 Essential (primary) hypertension: Secondary | ICD-10-CM | POA: Diagnosis not present

## 2017-08-14 DIAGNOSIS — H2589 Other age-related cataract: Secondary | ICD-10-CM | POA: Diagnosis not present

## 2017-08-14 DIAGNOSIS — E538 Deficiency of other specified B group vitamins: Secondary | ICD-10-CM | POA: Diagnosis not present

## 2017-08-14 DIAGNOSIS — E119 Type 2 diabetes mellitus without complications: Secondary | ICD-10-CM | POA: Diagnosis not present

## 2017-08-14 DIAGNOSIS — H2512 Age-related nuclear cataract, left eye: Secondary | ICD-10-CM | POA: Diagnosis not present

## 2017-08-14 DIAGNOSIS — C50911 Malignant neoplasm of unspecified site of right female breast: Secondary | ICD-10-CM | POA: Diagnosis not present

## 2017-08-14 DIAGNOSIS — E042 Nontoxic multinodular goiter: Secondary | ICD-10-CM | POA: Diagnosis not present

## 2017-08-14 DIAGNOSIS — H25032 Anterior subcapsular polar age-related cataract, left eye: Secondary | ICD-10-CM | POA: Diagnosis not present

## 2017-08-14 DIAGNOSIS — H2189 Other specified disorders of iris and ciliary body: Secondary | ICD-10-CM | POA: Diagnosis not present

## 2017-08-14 DIAGNOSIS — D649 Anemia, unspecified: Secondary | ICD-10-CM | POA: Diagnosis not present

## 2017-08-15 DIAGNOSIS — Z961 Presence of intraocular lens: Secondary | ICD-10-CM | POA: Diagnosis not present

## 2017-08-30 DIAGNOSIS — Z961 Presence of intraocular lens: Secondary | ICD-10-CM | POA: Diagnosis not present

## 2017-09-07 ENCOUNTER — Other Ambulatory Visit (HOSPITAL_COMMUNITY): Payer: Self-pay | Admitting: Nurse Practitioner

## 2017-09-19 DIAGNOSIS — Z961 Presence of intraocular lens: Secondary | ICD-10-CM | POA: Diagnosis not present

## 2018-01-16 ENCOUNTER — Other Ambulatory Visit: Payer: Self-pay | Admitting: Nurse Practitioner

## 2018-03-20 ENCOUNTER — Encounter: Payer: Self-pay | Admitting: General Practice

## 2021-08-31 DEATH — deceased
